# Patient Record
Sex: Female | Born: 1935 | Race: White | Hispanic: No | Smoking: Never smoker
Health system: Southern US, Community
[De-identification: ages and names within clinical notes are randomized; demographics above are authoritative.]

## PROBLEM LIST (undated history)

## (undated) ENCOUNTER — Emergency Department (HOSPITAL_COMMUNITY): Disposition: A | Discharge status: Home / Self Care | Payer: Self-pay

## (undated) DIAGNOSIS — E782 Mixed hyperlipidemia: Secondary | ICD-10-CM

## (undated) DIAGNOSIS — E119 Type 2 diabetes mellitus without complications: Secondary | ICD-10-CM

## (undated) DIAGNOSIS — I10 Essential (primary) hypertension: Secondary | ICD-10-CM

## (undated) DIAGNOSIS — G6181 Chronic inflammatory demyelinating polyneuritis: Secondary | ICD-10-CM

## (undated) DIAGNOSIS — Z86718 Personal history of other venous thrombosis and embolism: Secondary | ICD-10-CM

## (undated) HISTORY — DX: Type 2 diabetes mellitus without complications: E11.9

## (undated) HISTORY — DX: Personal history of other venous thrombosis and embolism: Z86.718

## (undated) HISTORY — DX: Chronic inflammatory demyelinating polyneuritis: G61.81

## (undated) HISTORY — DX: Mixed hyperlipidemia: E78.2

## (undated) HISTORY — DX: Essential (primary) hypertension: I10

## (undated) HISTORY — PX: CHOLECYSTECTOMY: SHX55

---

## 2005-09-07 ENCOUNTER — Other Ambulatory Visit: Admission: RE | Admit: 2005-09-07 | Discharge: 2005-09-07 | Payer: Self-pay | Admitting: Obstetrics & Gynecology

## 2008-07-11 ENCOUNTER — Emergency Department (HOSPITAL_COMMUNITY): Admission: EM | Admit: 2008-07-11 | Discharge: 2008-07-11 | Payer: Self-pay | Admitting: Emergency Medicine

## 2008-07-15 ENCOUNTER — Emergency Department (HOSPITAL_COMMUNITY): Admission: EM | Admit: 2008-07-15 | Discharge: 2008-07-15 | Payer: Self-pay | Admitting: Emergency Medicine

## 2008-08-30 ENCOUNTER — Ambulatory Visit: Payer: Self-pay | Admitting: Vascular Surgery

## 2008-08-30 ENCOUNTER — Encounter (INDEPENDENT_AMBULATORY_CARE_PROVIDER_SITE_OTHER): Payer: Self-pay | Admitting: Neurology

## 2008-08-30 ENCOUNTER — Ambulatory Visit: Admission: RE | Admit: 2008-08-30 | Discharge: 2008-08-30 | Payer: Self-pay | Admitting: Neurology

## 2008-09-05 ENCOUNTER — Encounter: Payer: Self-pay | Admitting: Neurology

## 2008-09-27 ENCOUNTER — Encounter: Payer: Self-pay | Admitting: Neurology

## 2008-10-19 ENCOUNTER — Emergency Department (HOSPITAL_COMMUNITY): Admission: EM | Admit: 2008-10-19 | Discharge: 2008-10-20 | Payer: Self-pay | Admitting: Emergency Medicine

## 2008-10-28 ENCOUNTER — Encounter: Payer: Self-pay | Admitting: Neurology

## 2009-02-25 HISTORY — PX: OTHER SURGICAL HISTORY: SHX169

## 2009-03-26 ENCOUNTER — Encounter (INDEPENDENT_AMBULATORY_CARE_PROVIDER_SITE_OTHER): Payer: Self-pay | Admitting: *Deleted

## 2009-03-26 ENCOUNTER — Ambulatory Visit (HOSPITAL_COMMUNITY): Admission: RE | Admit: 2009-03-26 | Discharge: 2009-03-26 | Payer: Self-pay | Admitting: *Deleted

## 2009-04-02 ENCOUNTER — Encounter: Admission: RE | Admit: 2009-04-02 | Discharge: 2009-04-02 | Payer: Self-pay | Admitting: General Surgery

## 2011-04-09 LAB — BASIC METABOLIC PANEL
CO2: 29 mEq/L (ref 19–32)
Calcium: 9.9 mg/dL (ref 8.4–10.5)
GFR calc Af Amer: >60 mL/min (ref >60)
Glucose, Bld: 112 mg/dL — ABNORMAL HIGH (ref 70–99)

## 2011-05-12 NOTE — Consult Note (Signed)
Linda Hess, Linda Hess              ACCOUNT NO.:  [id number]   MEDICAL RECORD NO.:  [id number]          PATIENT TYPE:  EMS   LOCATION:  MAJO                         FACILITY:  [facility]   PHYSICIAN:  Melvyn Novas, M.D.  DATE OF BIRTH:  27-Jan-1931   DATE OF CONSULTATION:  07/15/2008  DATE OF DISCHARGE:  07/15/2008                                 CONSULTATION   This patient was seen this morning by Dr. Carleene Cooper, in the [facility] ER where she presented at 7 a.m this sunday morning .  She is seen here today on July 15, 2008.  She states that on July 11, 2008, she presented at 6 p.m. to the [facility] ER visit with similar  problems that she presents this morning and as I understand, she is  frustrated that her outpatient workup has not been taken place and that  she has not been referred to a neurologist yet.  In short, she states she has left leg pain.  She had hip pain developed  on June 27, 2008, she got a cortisone shot,  I understand by her primary  care PA, who sees her instead of a primary care physician in [place].  Her hip pain subsided; however, she developed weakness and calf pain.  She was asked to see someone at [facility], there she was presenting -  again to the ER- and a head CT to rule out stroke was ordered, returned  negative.  She states, first her left lower leg hurt and then both legs started to  hurt.  A special problem was for her that she could not get into her car  without helping with her hands and arms to lift her legs into.    This is again worse on the left.  She had chronic pains in the hip and  knees, but those have not been bothering her as much.  She has been walking, but at one time; probably, Thursday last week, she  said both legs gave out and she is almost fell.  She has had, however,  multiple episodes since then that she feels that makes her unsafe for  her to ambulate out of reach of a object to hold onto.  She states that  she has  a history of diabetes mellitus; hypertension; and  hyperlipidemia, but she also had been diagnosed with coronary artery  disease and is on anti arthritis drugs.   HISTORY OF PRESENT ILLNESS:  Again, the patient had been seen in [place] by [facility], once she went to [facility], had an exam and the CT of the head, was advised to followup  with a neurologist, but states she never heard that an appointment have  been made.  Contacted [facility] where she was told  that referral forms to be processed will take 8-10 days.  Her family has  been in contact with [facility] only to learn that  we need indeed a referral and cannot see the patient on the self-  referral basis.  The patient states that yesterday, she was on a riding  lawn mower and has more achiness in her lower calves, now she points  basically to the left calf and she has weakness in the hip flexion on  the left, but she does not have significant pain and her symptoms are  described as mild-to-moderate.   PAST MEDICAL HISTORY:  As above.   SURGICAL HISTORY:  Cholecystectomy.   SOCIAL HISTORY:  The patient is retired, nonsmoker, and nondrinker.  No  history of drug abuse, living independently alone, has an adult son.  Marital status is not noted.   ALLERGIES:  She has no known drug allergies   MEDICATIONS:  1. She takes metformin orally 2 tablets of 500 mg once a day at night.  2. Simvastatin 40 mg once a day.  3. Aspirin.  She is supposed to take a baby aspirin daily and      diclofenac sodium orally 75 mg twice a day.  Lisinopril and HCTZ 10/ 12.5 mg in the morning   REVIEW OF SYSTEMS:  She only endorsed the sudden loss of lower extremity  strength that almost led to a fall, bilaterally foot numbness has been  persistent.  She has pain in the calf points to the left.  She has also  cold feet, sometimes bluish.  She has loss of reflexes and I  cannot see  any edema or clubbing.  She does not appear to be agitated or tearful.  No respiratory,  cardiovascular, gastrointestinal, or genitourinary complaints.  She has  been placed on diclofenac sodium for arthritic pain recently.   PHYSICAL EXAMINATION:  The patient is normocephalic and traumatic,  pleasant and cooperative.  I sense that she is here out of desperation,  but she has not been able to see an outpatient neurologist; however, she  is truly not in emergency.  She is in urgency at best.  Her general  physical examination shows lungs clear to auscultation.  She has  bilateral good popliteal pulses.  I had trouble finding the dorsalis  pedis pulse on the left, however, there is no evidence of clubbing or  cyanosis.  Her left foot is a slightly a bit colder than the right.    Psychiatrically, she appears intact alert and oriented x3.  She has a  supple neck.  She has no carotid bruit and no temporal artery  induration.  No TMJ pain.  No paraspinal tenderness.  No rash.  No  bruising.  Neurologic examination.  Again, mental status is intact.  No  evidence of apraxia, aphasia, or dysarthria.  Cranial nerve examination shows bilaterally equal reactivity to light  with both pupils.  There is a cataract history.  The patient has normal  appearing retinas and symmetric facial strength.  Tongue and uvula in  midline.  Full extraocular movements.  Range of motion for the neck is  intact and she has no sensory loss or facial asymmetry.  Can raise both  eyebrows and close eyes tightly.  Motor examination shows bilaterally upper extremity strengths to be  equal.  Tone and mass of the muscles is equal.  Lower extremity strength  is slightly weaker for dorsal flexion on the left foot, but plantar  flexion is intact and she had trouble lifting her leg at the hip.  Interestingly, she has a weakness for the hip flexion is on the right.  On physical exam, I needed to support the patient.   She could only  maintain  antigravity movement for about 4 seconds before she developed a  drift.  Deep tendon reflexes are attenuated throughout.  I cannot detect  a Babinski reflex.  There is no active tendon reflex, no patellar  reflex.  Upper extremity traces of reflexes.  Sensory to primary modalities absent in both feet up to the midcalf  level.  No hand numbness however.  No facial carry or mandibular  numbness, or tingling or dysesthesia.    Gait and station:  The patient was able to ambulate for me in the room  and took at least a dozen steps without any evidence of drifting or  viewing to the side.  She cannot perform a toe stand or heel walk.  No  tandem gait tested.  Coordination was tested by finger-to-nose.  Heel-to-shin was normal with  the left leg on the right shin.  When returning to the bed, the patient has trouble flexing the leg at  the hip again.   ASSESSMENT:  I do not think that a strictly arthritic origin for this  problem exisists. No traum at origin/ onset time and changing symptms  after the steroid injection are suspecious.   .  I do wonder if the patient has;  1. Diabetic neuropathy, given her areflexia, sensory loss in both      lower extremities, and her history of diabetes that was diagnosed 9      years ago, but worsened over the last 8 months leading to an      increase in oral medication.Diabetic amyatrophyin the differential      .  2. The patient's calf pain according to ER notes was not thought to      berelated to a deep venous thrombosis since she had a normal      Doppler , but she reported here symptoms of calf pain that do not      ascend above the knee-  the doppler studies were supposingly done ,      but I have not found an original report, reserve judgement .  3. 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  4.The  patient has not had lower back pain after straight leg raising  test, this is not a radiculopathic finding and she has no other cord  symptoms such as bladder or bowel incontinence.   PLAN:  At this time to admit to see the patient in the outpatient  environment soon for an EMG with nerve conduction study.  1. I would actually like to test both lower extremities (call to      schedule follow up at [phone]).  2. She needs labs, a CK/-MB, a C-reactive protein level, sed rate,      vitamin D level, and a basic metabolic panel.  I explained to the      patient that we will be happy to see her in a followup visit,      hopefully within the next 2-3 weeks and that she should call our      office on Monday to make the followup appointment, which could be      scheduled on a working day.      Melvyn Novas, M.D.  Electronically Signed     CD/MEDQ  D:  07/15/2008  T:  07/15/2008  Job:  [id number]   cc:   Nicoletta Dress. Colon Branch, M.D.  Osvaldo Human, M.D.  [facility]  Rhae Lerner. Margretta Ditty, M.D.

## 2011-05-12 NOTE — Op Note (Signed)
Linda Hess, Linda Hess              ACCOUNT NO.:  [id number]   MEDICAL RECORD NO.:  [id number]          PATIENT TYPE:  AMB   LOCATION:  DAY                          FACILITY:  [facility]   PHYSICIAN:  Alfonse Ras, MD   DATE OF BIRTH:  07/24/1931   DATE OF PROCEDURE:  DATE OF DISCHARGE:                               OPERATIVE REPORT   PREOPERATIVE DIAGNOSIS:  Diffuse muscle weakness.   POSTOPERATIVE DIAGNOSIS:  Diffuse muscle weakness.   PROCEDURE:  Left quadriceps muscle biopsy.   SURGEON:  Alfonse Ras, MD   ANESTHESIA:  MAC.   DESCRIPTION:  The patient was taken to the operating room.  After  informed consent was granted, she was placed in supine position.  MAC  anesthesia was induced and the left thigh was prepped and draped in  normal sterile fashion.  Using 1% lidocaine local anesthesia, I  anesthetized the skin and subcutaneous tissue.  Making a vertical  incision, I dissected down to subcutaneous fat onto the quadriceps  fascia.  This was opened vertically.  An area of muscle was then  mobilized with hemostats, and transected and sent for pathologic  evaluation.  Adequate hemostasis was assured and the skin was closed  with subcuticular 3-0 Monocryl.  Steri-Strips and sterile dressings were  applied.  The patient tolerated the procedure well and went to PACU in  good condition.      Alfonse Ras, MD  Electronically Signed     KRE/MEDQ  D:  03/26/2009  T:  03/26/2009  Job:  [id number]

## 2011-09-29 LAB — URINE MICROSCOPIC-ADD ON

## 2011-09-29 LAB — DIFFERENTIAL
Basophils Relative: 1
Eosinophils Absolute: 0.1
Eosinophils Relative: 1
Lymphocytes Relative: 19
Lymphs Abs: 1.9
Neutro Abs: 7.1

## 2011-09-29 LAB — CBC
Hemoglobin: 10.4 — ABNORMAL LOW
MCHC: 33.4
MCV: 85.7
Platelets: 256
RBC: 3.62 — ABNORMAL LOW
RDW: 14.7
WBC: 10

## 2011-09-29 LAB — URINALYSIS, ROUTINE W REFLEX MICROSCOPIC
Glucose, UA: NEGATIVE
Urobilinogen, UA: 1

## 2011-09-29 LAB — BASIC METABOLIC PANEL
Creatinine, Ser: 0.94
Glucose, Bld: 145 — ABNORMAL HIGH
Potassium: 4
Sodium: 133 — ABNORMAL LOW

## 2011-09-29 LAB — GLUCOSE, CAPILLARY: Glucose-Capillary: 145 — ABNORMAL HIGH

## 2012-04-26 ENCOUNTER — Other Ambulatory Visit (HOSPITAL_COMMUNITY): Payer: Self-pay | Admitting: Family Medicine

## 2012-04-26 DIAGNOSIS — Z139 Encounter for screening, unspecified: Secondary | ICD-10-CM

## 2012-04-29 ENCOUNTER — Ambulatory Visit (HOSPITAL_COMMUNITY)
Admission: RE | Admit: 2012-04-29 | Discharge: 2012-04-29 | Disposition: A | Discharge status: Home / Self Care | Payer: Medicare Other | Source: Ambulatory Visit | Attending: Family Medicine | Admitting: Family Medicine

## 2012-04-29 DIAGNOSIS — M899 Disorder of bone, unspecified: Secondary | ICD-10-CM | POA: Insufficient documentation

## 2012-04-29 DIAGNOSIS — Z139 Encounter for screening, unspecified: Secondary | ICD-10-CM

## 2013-07-24 ENCOUNTER — Ambulatory Visit: Payer: Self-pay | Admitting: Orthopedic Surgery

## 2013-08-22 ENCOUNTER — Ambulatory Visit: Payer: Self-pay | Admitting: Orthopedic Surgery

## 2013-08-22 DIAGNOSIS — E119 Type 2 diabetes mellitus without complications: Secondary | ICD-10-CM

## 2013-08-22 LAB — URINALYSIS, COMPLETE
Bilirubin,UR: NEGATIVE
Blood: NEGATIVE
Glucose,UR: >=500 mg/dL (ref 0–75)
Leukocyte Esterase: NEGATIVE
Ph: 6 (ref 4.5–8.0)
Specific Gravity: 1.018 (ref 1.003–1.030)
WBC UR: 1 /HPF (ref 0–5)

## 2013-08-22 LAB — BASIC METABOLIC PANEL
BUN: 42 mg/dL — ABNORMAL HIGH (ref 7–18)
Calcium, Total: 9.5 mg/dL (ref 8.5–10.1)
Chloride: 106 mmol/L (ref 98–107)
Co2: 30 mmol/L (ref 21–32)
Creatinine: 1.23 mg/dL (ref 0.60–1.30)
EGFR (African American): 48 — ABNORMAL LOW
EGFR (Non-African Amer.): 42 — ABNORMAL LOW
Glucose: 237 mg/dL — ABNORMAL HIGH (ref 65–99)
Osmolality: 298 (ref 275–301)
Sodium: 140 mmol/L (ref 136–145)

## 2013-08-22 LAB — SEDIMENTATION RATE: Erythrocyte Sed Rate: 4 mm/hr (ref 0–30)

## 2013-08-22 LAB — CBC
HGB: 14.3 g/dL (ref 12.0–16.0)
MCH: 31.4 pg (ref 26.0–34.0)
RDW: 14.4 % (ref 11.5–14.5)
WBC: 9.9 10*3/uL (ref 3.6–11.0)

## 2013-08-22 LAB — PROTIME-INR: INR: 0.8

## 2013-08-28 HISTORY — PX: OTHER SURGICAL HISTORY: SHX169

## 2013-08-29 ENCOUNTER — Inpatient Hospital Stay: Payer: Self-pay | Admitting: Orthopedic Surgery

## 2013-08-30 LAB — PLATELET COUNT: Platelet: 124 10*3/uL — ABNORMAL LOW (ref 150–440)

## 2013-08-30 LAB — BASIC METABOLIC PANEL
BUN: 27 mg/dL — ABNORMAL HIGH (ref 7–18)
Calcium, Total: 9.1 mg/dL (ref 8.5–10.1)
Chloride: 107 mmol/L (ref 98–107)
Co2: 26 mmol/L (ref 21–32)
EGFR (African American): >60
Glucose: 164 mg/dL — ABNORMAL HIGH (ref 65–99)
Potassium: 5.2 mmol/L — ABNORMAL HIGH (ref 3.5–5.1)
Sodium: 139 mmol/L (ref 136–145)

## 2013-08-31 LAB — PATHOLOGY REPORT

## 2013-08-31 LAB — POTASSIUM: Potassium: 4.3 mmol/L (ref 3.5–5.1)

## 2013-09-01 ENCOUNTER — Encounter: Payer: Self-pay | Admitting: Internal Medicine

## 2013-09-27 ENCOUNTER — Encounter: Payer: Self-pay | Admitting: Internal Medicine

## 2013-10-30 ENCOUNTER — Ambulatory Visit: Payer: Self-pay | Admitting: Orthopedic Surgery

## 2014-01-11 ENCOUNTER — Other Ambulatory Visit (HOSPITAL_COMMUNITY): Payer: Self-pay | Admitting: Podiatry

## 2014-01-11 DIAGNOSIS — L98499 Non-pressure chronic ulcer of skin of other sites with unspecified severity: Secondary | ICD-10-CM

## 2014-01-15 ENCOUNTER — Ambulatory Visit (HOSPITAL_COMMUNITY)
Admission: RE | Admit: 2014-01-15 | Discharge: 2014-01-15 | Disposition: A | Discharge status: Home / Self Care | Payer: Medicare Other | Source: Ambulatory Visit | Attending: Podiatry | Admitting: Podiatry

## 2014-01-15 DIAGNOSIS — L97509 Non-pressure chronic ulcer of other part of unspecified foot with unspecified severity: Secondary | ICD-10-CM | POA: Insufficient documentation

## 2014-01-15 DIAGNOSIS — L98499 Non-pressure chronic ulcer of skin of other sites with unspecified severity: Secondary | ICD-10-CM | POA: Insufficient documentation

## 2014-01-15 DIAGNOSIS — E119 Type 2 diabetes mellitus without complications: Secondary | ICD-10-CM | POA: Insufficient documentation

## 2014-01-15 DIAGNOSIS — L97209 Non-pressure chronic ulcer of unspecified calf with unspecified severity: Secondary | ICD-10-CM | POA: Insufficient documentation

## 2014-01-15 DIAGNOSIS — I739 Peripheral vascular disease, unspecified: Secondary | ICD-10-CM | POA: Insufficient documentation

## 2014-03-12 DIAGNOSIS — E1122 Type 2 diabetes mellitus with diabetic chronic kidney disease: Secondary | ICD-10-CM | POA: Insufficient documentation

## 2014-03-12 DIAGNOSIS — G6181 Chronic inflammatory demyelinating polyneuritis: Secondary | ICD-10-CM | POA: Insufficient documentation

## 2014-03-12 DIAGNOSIS — N183 Chronic kidney disease, stage 3 unspecified: Secondary | ICD-10-CM | POA: Insufficient documentation

## 2014-03-12 DIAGNOSIS — IMO0001 Reserved for inherently not codable concepts without codable children: Secondary | ICD-10-CM

## 2014-03-12 DIAGNOSIS — N189 Chronic kidney disease, unspecified: Secondary | ICD-10-CM | POA: Insufficient documentation

## 2014-03-12 DIAGNOSIS — E782 Mixed hyperlipidemia: Secondary | ICD-10-CM | POA: Insufficient documentation

## 2014-03-12 DIAGNOSIS — IMO0002 Reserved for concepts with insufficient information to code with codable children: Secondary | ICD-10-CM | POA: Insufficient documentation

## 2014-03-12 DIAGNOSIS — M1990 Unspecified osteoarthritis, unspecified site: Secondary | ICD-10-CM | POA: Insufficient documentation

## 2014-03-12 DIAGNOSIS — E1165 Type 2 diabetes mellitus with hyperglycemia: Secondary | ICD-10-CM

## 2014-03-12 DIAGNOSIS — I10 Essential (primary) hypertension: Secondary | ICD-10-CM

## 2014-03-12 DIAGNOSIS — E785 Hyperlipidemia, unspecified: Secondary | ICD-10-CM

## 2014-03-14 ENCOUNTER — Ambulatory Visit (INDEPENDENT_AMBULATORY_CARE_PROVIDER_SITE_OTHER): Payer: Medicare Other | Admitting: Cardiology

## 2014-03-14 ENCOUNTER — Encounter: Payer: Self-pay | Admitting: Cardiology

## 2014-03-14 ENCOUNTER — Other Ambulatory Visit: Payer: Self-pay | Admitting: Cardiology

## 2014-03-14 VITALS — BP 130/80 | HR 68 | Ht 66.0 in | Wt 176.4 lb

## 2014-03-14 DIAGNOSIS — E782 Mixed hyperlipidemia: Secondary | ICD-10-CM

## 2014-03-14 DIAGNOSIS — G6181 Chronic inflammatory demyelinating polyneuritis: Secondary | ICD-10-CM | POA: Insufficient documentation

## 2014-03-14 DIAGNOSIS — I739 Peripheral vascular disease, unspecified: Secondary | ICD-10-CM | POA: Insufficient documentation

## 2014-03-14 DIAGNOSIS — E119 Type 2 diabetes mellitus without complications: Secondary | ICD-10-CM

## 2014-03-14 DIAGNOSIS — I10 Essential (primary) hypertension: Secondary | ICD-10-CM

## 2014-03-14 NOTE — Assessment & Plan Note (Signed)
On Zocor, followed by primary care.

## 2014-03-14 NOTE — Assessment & Plan Note (Signed)
Not on any specific antihypertensives at this time. She does have LVH by ECG with probable repolarization abnormalities. Cardiac structure and function being evaluated by echocardiogram.

## 2014-03-14 NOTE — Assessment & Plan Note (Signed)
On glipizide, followed by primary care.

## 2014-03-14 NOTE — Assessment & Plan Note (Signed)
Reportedly diagnosed approximately 5 years ago, on chronic steroids. Patient tells that she is in the process of trying to reestablish regular neurology followup through [facility].

## 2014-03-14 NOTE — Assessment & Plan Note (Signed)
Patient without any clear history of obstructive peripheral arterial disease, no claudication, recent arterial studies in January showing ABI of 0.99 on the right and 0.96 on the left. Possibility of mild occlusive disease, predominantly left infrapopliteal is mentioned. This seems to be an unlikely explanation for her venous stasis and chronic leg edema. She does mention a prior history of left leg "blood clot" presumably a DVT, although information is somewhat vague. She was previously on Coumadin. We will plan to go ahead and get bilateral leg venous Dopplers, she certainly might have venous insufficiency. Echocardiogram is being obtained to assess cardiac structure and function, mainly to exclude cardiomyopathy as a potential contributor. We will inform her of the results. If additional vascular evaluation is needed, I will refer her to one of our vascular interventionalists in the group.

## 2014-03-14 NOTE — Progress Notes (Signed)
Clinical Summary Ms. Lorenda HatchetSlade is an 80 y.o.female referred for cardiology consultation by Mr. Cleotis Lemaobertson PA-C [facility]. Records indicate concern for possible peripheral arterial disease. 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 She has had neurology followup with different providers over time, tells that she is trying to reestablish with a neurologist at [facility] that she had seen in the past. She has been on long-term steroids, currently on prednisone 15 mg daily. She uses a walker to ambulate, denies any leg pain or definite claudication, reports discoloration of her legs below the knees for several years. She does report having had an ulcer on her right foot that took a long time to heal, but states that this has since done well.  She also provides a history of a "blood clot" in her left leg several years ago. Details are not clear. She does not recall actually having any testing on her leg at that time. She does state that she was on Coumadin for nearly 2 years. I presume that she had a DVT on the left.  Recent lower extremity Dopplers done in January of this year demonstrated only mild lower extremity arterial occlusive disease at rest, predominantly left infrapopliteal. ABI was 0.99 on the right, and 0.96 on the left. I discussed this with her today.  ECG shows sinus rhythm with LVH and probable repolarization abnormalities, no old tracing available for comparison however. She does not endorse any exertional chest pain or limiting shortness of breath. She states that she lives alone and is functional in her ADLs including some housework.   No Known Allergies  Current Outpatient Prescriptions  Medication Sig Dispense Refill  . folic acid (FOLVITE) 1 MG tablet Take 1 mg by mouth daily.      Marland Kitchen. gabapentin (NEURONTIN) 300 MG capsule Take 300 mg by mouth 3 (three) times daily.      Marland Kitchen. glipiZIDE (GLUCOTROL) 5 MG tablet Take 5 mg by mouth daily before  breakfast.      . simvastatin (ZOCOR) 10 MG tablet Take 10 mg by mouth daily.      . Ascorbic Acid (VITA-C) CRYS Take 1 tablet by mouth daily.    0  . calcium-vitamin D (OSCAL WITH D) 500-200 MG-UNIT per tablet Take 1 tablet by mouth daily with breakfast.      . Iron TABS Take 1 tablet by mouth daily.      . predniSONE (DELTASONE) 5 MG tablet Take 3 tablets (15 mg total) by mouth daily with breakfast.       No current facility-administered medications for this visit.    Past Medical History  Diagnosis Date  . Type 2 diabetes mellitus   . Essential hypertension, benign   . Mixed hyperlipidemia   . History of DVT (deep vein thrombosis)     Reportedly left leg, details not clear  . CIDP (chronic inflammatory demyelinating polyneuropathy)     Past Surgical History  Procedure Laterality Date  . Right knee replacement  September 2014  . Left quadriceps muscle biopsy  March 2010  . Cholecystectomy      Family History  Problem Relation Age of Onset  . Heart attack Father   . Stroke Mother   . Cancer Brother     Social History Ms. Lorenda HatchetSlade reports that she has never smoked. She does not have any smokeless tobacco history on file. Ms. Lorenda HatchetSlade reports that she does not drink alcohol.  Review of Systems No fevers  or chills. No orthopnea or PND. Stable appetite. No reported bleeding episodes. Negative except as outlined.  Physical Examination Filed Vitals:   03/14/14 0917  BP: 130/80  Pulse: 68   Filed Weights   03/14/14 0917  Weight: 176 lb 6.4 oz (80.015 kg)   Patient is comfortable at rest. HEENT: Conjunctiva and lids normal, oropharynx clear. Neck: Supple, no elevated JVP or carotid bruits, no thyromegaly. Lungs: Clear to auscultation, nonlabored breathing at rest. Cardiac: Regular rate and rhythm, soft S4, no significant systolic murmur, no pericardial rub. Abdomen: Soft, nontender, bowel sounds present, no guarding or rebound. Extremities: Significant venous stasis below  the knees bilaterally, evidence of prior skin injury on the right, some excoriations. 1+ edema is noted with some skin pitting, distal pulses 1+. Skin: Warm and dry. Musculoskeletal: No kyphosis. Neuropsychiatric: Alert and oriented x3, affect grossly appropriate.   Problem List and Plan   Peripheral arterial disease Patient without any clear history of obstructive peripheral arterial disease, no claudication, recent arterial studies in January showing ABI of 0.99 on the right and 0.96 on the left. Possibility of mild occlusive disease, predominantly left infrapopliteal is mentioned. This seems to be an unlikely explanation for her venous stasis and chronic leg edema. She does mention a prior history of left leg "blood clot" presumably a DVT, although information is somewhat vague. She was previously on Coumadin. We will plan to go ahead and get bilateral leg venous Dopplers, she certainly might have venous insufficiency. Echocardiogram is being obtained to assess cardiac structure and function, mainly to exclude cardiomyopathy as a potential contributor. We will inform her of the results. If additional vascular evaluation is needed, I will refer her to one of our vascular interventionalists in the group.  CIDP (chronic inflammatory demyelinating polyneuropathy) Reportedly diagnosed approximately 5 years ago, on chronic steroids. Patient tells that she is in the process of trying to reestablish regular neurology followup through [facility]  Essential hypertension, benign Not on any specific antihypertensives at this time. She does have LVH by ECG with probable repolarization abnormalities. Cardiac structure and function being evaluated by echocardiogram.  Mixed hyperlipidemia On Zocor, followed by primary care.  Type 2 diabetes mellitus On glipizide, followed by primary care.    Jonelle Sidle, M.D., F.A.C.C.

## 2014-03-14 NOTE — Patient Instructions (Signed)
Your physician recommends that you schedule a follow-up appointment in: AS NEEDED  Your physician has requested that you have an echocardiogram. Echocardiography is a painless test that uses sound waves to create images of your heart. It provides your doctor with information about the size and shape of your heart and how well your heart's chambers and valves are working. This procedure takes approximately one hour. There are no restrictions for this procedure.   Your physician has requested that you have a lower or upper extremity venous duplex. This test is an ultrasound of the veins in the legs or arms. It looks at venous blood flow that carries blood from the heart to the legs or arms. Allow one hour for a Lower Venous exam. Allow thirty minutes for an Upper Venous exam. There are no restrictions or special instructions.

## 2014-03-19 ENCOUNTER — Ambulatory Visit (HOSPITAL_COMMUNITY)
Admission: RE | Admit: 2014-03-19 | Discharge: 2014-03-19 | Disposition: A | Discharge status: Home / Self Care | Payer: Medicare Other | Source: Ambulatory Visit | Attending: Cardiology | Admitting: Cardiology

## 2014-03-19 DIAGNOSIS — I739 Peripheral vascular disease, unspecified: Secondary | ICD-10-CM

## 2014-03-19 DIAGNOSIS — I10 Essential (primary) hypertension: Secondary | ICD-10-CM

## 2014-03-19 DIAGNOSIS — R609 Edema, unspecified: Secondary | ICD-10-CM | POA: Insufficient documentation

## 2014-03-19 DIAGNOSIS — E785 Hyperlipidemia, unspecified: Secondary | ICD-10-CM | POA: Insufficient documentation

## 2014-03-19 DIAGNOSIS — E119 Type 2 diabetes mellitus without complications: Secondary | ICD-10-CM | POA: Insufficient documentation

## 2014-03-19 DIAGNOSIS — I517 Cardiomegaly: Secondary | ICD-10-CM

## 2014-03-19 DIAGNOSIS — M79609 Pain in unspecified limb: Secondary | ICD-10-CM | POA: Insufficient documentation

## 2014-03-19 DIAGNOSIS — I82409 Acute embolism and thrombosis of unspecified deep veins of unspecified lower extremity: Secondary | ICD-10-CM | POA: Insufficient documentation

## 2014-03-19 DIAGNOSIS — R9431 Abnormal electrocardiogram [ECG] [EKG]: Secondary | ICD-10-CM | POA: Insufficient documentation

## 2014-03-19 NOTE — Progress Notes (Signed)
*  PRELIMINARY RESULTS* Echocardiogram 2D Echocardiogram has been performed.  Linda Hess 03/19/2014, 11:20 AM

## 2015-04-19 NOTE — Discharge Summary (Signed)
PATIENT NAME:  Linda CelestineSLADE, Blenda P MR#:  [id number] DATE OF BIRTH:  07-28-1931  DATE OF ADMISSION:  08/29/2013 DATE OF DISCHARGE:  09/01/2013  ADMITTING DIAGNOSIS: Right knee osteoarthritis.   DISCHARGE DIAGNOSIS: Right knee osteoarthritis.   PROCEDURE: Right total knee replacement.   SURGEON: Leitha SchullerMichael J. Menz, M.D.   ASSISTANT: Cranston Neighborhris Lorilyn Laitinen, PA-C.   ANESTHESIA: General.   ESTIMATED BLOOD LOSS: 50 mL.  TOURNIQUET TIME: 64 minutes at 300 mmHg.  IMPLANTS: Medacta GMK sphere system with a size 2 patella, a right size 12 mm Flex force, size 4 tibial insert with a right size 4 femoral component,  sphere type, a tibial tray, which was T3-I4 with an 11 mm x 65 mm extension stem.   SPECIMEN: Cut ends of bone.   HISTORY: The patient is 81 year old with severe right knee osteoarthritis. She had debilitating pain and marked stiffness. She is now in for a history and physical. She has had nonoperative treatment without relief. She currently is taking prednisone 20 mg a day, gabapentin and tramadol. She has a history of chronic inflammatory thigh neuropathy.   PHYSICAL EXAMINATION:  LUNGS: Clear to auscultation.  HEART: Regular rate and rhythm.  EXTREMITIES: She has 15 to 90 degree range of motion with the right knee with valgus deformity that is passively correctable. She has a drop foot and diminished sensation of the plantar and dorsal aspect of the foot. She has mild swelling to the knee with a Baker's cyst present. There is crepitation on range of motion. She has trace dorsalis pedis pulse. She has full upper and lower dentures.   HOSPITAL COURSE: The patient was admitted to the hospital on 08/29/2013. She had surgery that same day and was brought to the orthopedic floor from the PACU in stable condition. On postoperative day 1, the patient's hemoglobin was found to be normal. Her labs and vital signs are stable. On postoperative day 2, potassium was stable and 4.5. She progressed very well with  physical therapy. On postoperative day 3, the patient was doing very well. She did have small skin tears on the posterior aspect of the knee. These were kept clean and dry and dressings changed as needed. There are no signs of infection. The patient is doing well and ready for discharge to a rehab facility.   CONDITION AT DISCHARGE: Stable.   DISCHARGE INSTRUCTIONS:  1.  The patient may gradually increase weight-bearing on the affected extremity.  2.  She is to wear thigh-high TED hose on both legs and remove 1 hour per 8 hour shift. Elevate heels off the bed.  3.  Use incentive spirometry every hour while awake and encouraged cough and deep breathing. 4.  She may resume a regular diet as tolerated.  5.  Do not get the dressing or bandage wet or dirty.  6 . Call [facility] if any of the following occur: Any bright red bleeding from the incision wound, fever above 101.5 degrees, redness, swelling or drainage at the incision.  7.  Call [facility] if you experience any increased leg pain, numbness or weakness in your legs or bowel or bladder symptoms.  8.  She is referred to physical therapy.  9.  [facility] is to [facility] for followup appointment in 2 weeks.   DISCHARGE MEDICATIONS: Gabapentin 300 mg 1 cap 3 times a day, simvastatin 10 mg 1 tablet orally once a day, glipizide extended-release 5 mg tablet 1 tablet orally once a day in the morning, prednisone 10 mg  2 tabs orally once a day, ferrous sulfate 65 mg orally once a day in the morning, vitamin C 500 mg tablet 1 tablet orally once a day in the morning, folic acid 0.8 mg tablet 1 tablet orally once a day in the morning, fish oil 1000 mg one orally in the morning, aspirin 81 mg 1 tablet orally once a day at bedtime, calcium and vitamin D one cap orally 2 times a day, oxycodone 5 mg 1 tablet orally every 4 hours as needed for pain, Tylenol 500 mg oral tablet 1 tablet orally every 4 hours as needed for pain or temperature greater than 100.4, magnesium  hydroxide 8% oral suspension 30 mL orally 2 times a day as needed for constipation, milk of magnesia 30 mL orally every 6 hours as needed for indigestion and heartburn, insulin regular 100 units, Zofran 4 mg oral tablet 1 tablet orally every 8t hours needed, zolpidem 5 mg 1 tablet orally once day at bedtime as needed, nystatin 1000 units topical powder 1 application topically 2 times a day, bisacodyl 10 mg rectal suppository 1 suppository rectally once a day as needed for constipation and docusate/senna 50 mg/8.6 mg oral tablet 1 tablet orally 2 times a day. ____________________________ T. Cranston Neighbor, PA-C tcg:aw D: 09/01/2013 07:52:28 ET T: 09/01/2013 08:10:11 ET JOB#: [id number]  cc: T. Cranston Neighbor, PA-C, <Dictator> Evon Slack [place] ELECTRONICALLY SIGNED 10/04/2013 9:09

## 2015-04-19 NOTE — Op Note (Signed)
PATIENT NAME:  Linda CelestineSLADE, Karolyne P MR#:  [id number] DATE OF BIRTH:  1931/09/10  DATE OF PROCEDURE:  08/29/2013  PREOPERATIVE DIAGNOSIS: Right knee osteoarthritis.   POSTOPERATIVE DIAGNOSIS: Right knee osteoarthritis.   PROCEDURE: Right total knee replacement.   SURGEON: Leitha SchullerMichael J. Etrulia Zarr, M.D.   ASSISTANT:  Cranston Neighborhris Gaines, PA-C.   ANESTHESIA: General.   DESCRIPTION OF PROCEDURE: The patient was brought to the operating room and after adequate anesthesia was obtained, the right leg was prepped and draped in the usual sterile fashion with a tourniquet applied to the upper thigh. After prepping and draping in the usual sterile fashion with the Alvarado legholder utilized, appropriate patient identification and timeout procedures were completed. The leg was exsanguinated with an Esmarch and the tourniquet raised. Next, the midline skin incision was made followed by medial parapatellar arthrotomy. Findings included significant synovitis within the joint, severe lateral compartment arthrosis, moderate medial compartment arthritis and severe patellofemoral disease. Fat pad and ACL were excised, and the proximal tibia and distal femur were exposed for placement of the Medacta cutting blocks.  Tibia cut was made first and then the residual meniscus posterior horns were removed, along with the PCL. A distal femoral guide was placed and distal femoral cut carried out. These cuts matched the template. A size 4 cutting block was applied, anterior and posterior chamfer cuts made, and the trial fit well. With tibia trial being placed, rotation based on the one of the initial tibial pins. Additionally, because of has severe osteoporosis, a sleeve was attached with proximal hand reaming carried out and a 70 mm trial stem also placed on our tibial tray. After the trials were placed, a 12 mm insert gave very good stability throughout a range of motion and allowed for full extension. The patella was cut with a tibia guide and  measured a size 2 with 3 drill holes made. The notch cut was then made for the distal femur and all trial components were removed. The tourniquet was let down to make sure there was no excessive bleeding, and there was no significant bleeding. The knee was injected at this point with Exparel mixed with saline in a periarticular fashion for postop analgesia, as was a combination of morphine with Sensorcaine. Next, the tourniquet was raised and the bony surfaces again thoroughly irrigated and dried. The tibial component was cemented into place first with excess cement removed, followed by placement of the 12 mm tibial insert. The femoral component was impacted into place and the knee held in extension while the patellar button was also clamped into place. After the cement had set, excess cement was removed. The patella tracked well with no-touch technique. The arthrotomy was then repaired using a heavy quill with additional Ethibond because of her poor tissue quality secondary to steroids. The subcutaneous tissue was approximated using 2-0 quill suture, followed by skin staples. Xeroform, 4 x 4's, Webril and bias wrap were applied, along with a Polar Care and knee immobilizer. The patient was sent to the recovery room in stable condition.   ESTIMATED BLOOD LOSS: 50 mL.   TOURNIQUET TIME: 64 minutes at 300 mmHg.   IMPLANTS: Medacta GMK Sphere System with a size 2 patella, a size right 12 mm flex 4, size 4 tibial insert with a right size 4 femoral component, sphere type, a tibial tray, which was T3-I4 with an 11 mm x 65 mm extension stem.   SPECIMENS: Cut ends of bone.     ____________________________ Leitha SchullerMichael J. Onesty Clair, MD mjm:dmm  D: 08/29/2013 20:45:52 ET T: 08/29/2013 22:12:29 ET JOB#: [id number]  cc: Leitha Schuller, MD, <Dictator> Leitha Schuller MD ELECTRONICALLY SIGNED 08/30/2013 7:11

## 2015-06-19 ENCOUNTER — Emergency Department (HOSPITAL_COMMUNITY): Payer: Medicare Other

## 2015-06-19 ENCOUNTER — Emergency Department (HOSPITAL_COMMUNITY)
Admission: EM | Admit: 2015-06-19 | Discharge: 2015-06-19 | Disposition: A | Discharge status: Home / Self Care | Payer: Medicare Other | Attending: Emergency Medicine | Admitting: Emergency Medicine

## 2015-06-19 ENCOUNTER — Encounter (HOSPITAL_COMMUNITY): Payer: Self-pay | Admitting: Emergency Medicine

## 2015-06-19 DIAGNOSIS — Z86718 Personal history of other venous thrombosis and embolism: Secondary | ICD-10-CM | POA: Insufficient documentation

## 2015-06-19 DIAGNOSIS — M7989 Other specified soft tissue disorders: Secondary | ICD-10-CM

## 2015-06-19 DIAGNOSIS — G6181 Chronic inflammatory demyelinating polyneuritis: Secondary | ICD-10-CM | POA: Insufficient documentation

## 2015-06-19 DIAGNOSIS — E782 Mixed hyperlipidemia: Secondary | ICD-10-CM | POA: Insufficient documentation

## 2015-06-19 DIAGNOSIS — R2243 Localized swelling, mass and lump, lower limb, bilateral: Secondary | ICD-10-CM | POA: Diagnosis present

## 2015-06-19 DIAGNOSIS — E119 Type 2 diabetes mellitus without complications: Secondary | ICD-10-CM | POA: Diagnosis not present

## 2015-06-19 DIAGNOSIS — Z7952 Long term (current) use of systemic steroids: Secondary | ICD-10-CM | POA: Insufficient documentation

## 2015-06-19 DIAGNOSIS — Z96651 Presence of right artificial knee joint: Secondary | ICD-10-CM | POA: Insufficient documentation

## 2015-06-19 DIAGNOSIS — R600 Localized edema: Secondary | ICD-10-CM | POA: Diagnosis not present

## 2015-06-19 DIAGNOSIS — I10 Essential (primary) hypertension: Secondary | ICD-10-CM | POA: Insufficient documentation

## 2015-06-19 DIAGNOSIS — Z79899 Other long term (current) drug therapy: Secondary | ICD-10-CM | POA: Insufficient documentation

## 2015-06-19 LAB — CBC WITH DIFFERENTIAL/PLATELET
Basophils Absolute: 0 10*3/uL (ref 0.0–0.1)
Basophils Relative: 0 % (ref 0–1)
Eosinophils Absolute: 0 10*3/uL (ref 0.0–0.7)
Eosinophils Relative: 0 % (ref 0–5)
HCT: 44.9 % (ref 36.0–46.0)
Hemoglobin: 14.7 g/dL (ref 12.0–15.0)
LYMPHS ABS: 0.8 10*3/uL (ref 0.7–4.0)
LYMPHS PCT: 8 % — AB (ref 12–46)
MCH: 31.5 pg (ref 26.0–34.0)
MCHC: 32.7 g/dL (ref 30.0–36.0)
MCV: 96.1 fL (ref 78.0–100.0)
MONOS PCT: 2 % — AB (ref 3–12)
Monocytes Absolute: 0.2 10*3/uL (ref 0.1–1.0)
NEUTROS PCT: 90 % — AB (ref 43–77)
Neutro Abs: 8.7 10*3/uL — ABNORMAL HIGH (ref 1.7–7.7)
PLATELETS: 145 10*3/uL — AB (ref 150–400)
RBC: 4.67 MIL/uL (ref 3.87–5.11)
RDW: 12.5 % (ref 11.5–15.5)
WBC: 9.7 10*3/uL (ref 4.0–10.5)

## 2015-06-19 LAB — BRAIN NATRIURETIC PEPTIDE: B NATRIURETIC PEPTIDE 5: 223 pg/mL — AB (ref 0.0–100.0)

## 2015-06-19 LAB — BASIC METABOLIC PANEL
Anion gap: 12 (ref 5–15)
BUN: 37 mg/dL — ABNORMAL HIGH (ref 6–20)
CHLORIDE: 104 mmol/L (ref 101–111)
CO2: 23 mmol/L (ref 22–32)
Calcium: 9.4 mg/dL (ref 8.9–10.3)
Creatinine, Ser: 1.52 mg/dL — ABNORMAL HIGH (ref 0.44–1.00)
GFR calc Af Amer: 36 mL/min — ABNORMAL LOW (ref >60)
GFR calc non Af Amer: 31 mL/min — ABNORMAL LOW (ref >60)
GLUCOSE: 297 mg/dL — AB (ref 65–99)
POTASSIUM: 4.4 mmol/L (ref 3.5–5.1)
SODIUM: 139 mmol/L (ref 135–145)

## 2015-06-19 NOTE — ED Provider Notes (Signed)
CSN: [id number]     Arrival date & time 06/19/15  1255 History   First MD Initiated Contact with Patient 06/19/15 1310     Chief Complaint  Patient presents with  . Leg Swelling     (Consider location/radiation/quality/duration/timing/severity/associated sxs/prior Treatment) HPI Comments: Patient sent from PCPs office with leg swelling for the past one month. Right greater than left. Her PCP is concerned about blood clot which she's had in the past. She reports injuring her leg against a flowerpot about a month ago and has a hematoma to her medial right tibia. She denies any pain. She is on aspirin but no other anticoagulation. She has a second injury on the left tibia from 3 months ago that is improving. There is a small hematoma to the left tibia as well. 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 She has chronic foot drop bilaterally. She denies any history of heart failure. No chest pain or shortness of breath. No nausea or vomiting. No fever.[place]   The history is provided by the patient.    Past Medical History  Diagnosis Date  . Type 2 diabetes mellitus   . Essential hypertension, benign   . Mixed hyperlipidemia   . History of DVT (deep vein thrombosis)     Reportedly left leg, details not clear  . CIDP (chronic inflammatory demyelinating polyneuropathy)    Past Surgical History  Procedure Laterality Date  . Right knee replacement  September 2014  . Left quadriceps muscle biopsy  March 2010  . Cholecystectomy     Family History  Problem Relation Age of Onset  . Heart attack Father   . Stroke Mother   . Cancer Brother    History  Substance Use Topics  . Smoking status: Never Smoker   . Smokeless tobacco: Not on file  . Alcohol Use: No   OB History    No data available     Review of Systems  Constitutional: Negative for fever, activity change and appetite change.  HENT: Negative for congestion and  rhinorrhea.   Respiratory: Negative for cough, chest tightness and shortness of breath.   Cardiovascular: Negative for chest pain.  Gastrointestinal: Negative for nausea, vomiting and abdominal pain.  Genitourinary: Negative for dysuria, hematuria, vaginal bleeding and vaginal discharge.  Musculoskeletal: Positive for myalgias and arthralgias.  Skin: Positive for wound. Negative for rash.  Neurological: Negative for dizziness, weakness and headaches.  A complete 10 system review of systems was obtained and all systems are negative except as noted in the HPI and PMH.      Allergies  Other  Home Medications   Prior to Admission medications   Medication Sig Start Date End Date Taking? Authorizing Provider  Ascorbic Acid (VITA-C) CRYS Take 1 tablet by mouth daily. 03/14/14  Yes Jonelle Sidle, MD  calcium-vitamin D (OSCAL WITH D) 500-200 MG-UNIT per tablet Take 2 tablets by mouth daily with breakfast.  03/14/14  Yes Jonelle Sidle, MD  folic acid (FOLVITE) 1 MG tablet Take 1 mg by mouth daily.   Yes Historical Provider, MD  gabapentin (NEURONTIN) 300 MG capsule Take 300 mg by mouth 3 (three) times daily.   Yes Historical Provider, MD  glipiZIDE (GLUCOTROL) 5 MG tablet Take 5 mg by mouth daily before breakfast.   Yes Historical Provider, MD  Iron TABS Take 1 tablet by mouth daily. 03/14/14  Yes Jonelle Sidle, MD  predniSONE (DELTASONE) 5 MG tablet Take 3 tablets (  15 mg total) by mouth daily with breakfast. 03/14/14  Yes Jonelle Sidle, MD  simvastatin (ZOCOR) 10 MG tablet Take 10 mg by mouth daily.   Yes Historical Provider, MD   BP 132/90 mmHg  Pulse 73  Temp(Src) 98.1 F (36.7 C) (Oral)  Resp 16  Ht  (1.676 m)  Wt 172 lb (78.019 kg)  BMI 27.77 kg/m2  SpO2 97% Physical Exam  Constitutional: She is oriented to person, place, and time. She appears well-developed and well-nourished. No distress.  HENT:  Head: Normocephalic and atraumatic.  Mouth/Throat: Oropharynx is  clear and moist. No oropharyngeal exudate.  Eyes: Conjunctivae and EOM are normal. Pupils are equal, round, and reactive to light.  Neck: Normal range of motion. Neck supple.  No meningismus.  Cardiovascular: Normal rate, regular rhythm, normal heart sounds and intact distal pulses.   No murmur heard. Pulmonary/Chest: Effort normal and breath sounds normal. No respiratory distress.  Abdominal: Soft. There is no tenderness. There is no rebound and no guarding.  Musculoskeletal: Normal range of motion. She exhibits edema. She exhibits no tenderness.  +1 pretibial edema bilaterally. There is a hematoma 5x5cm to right medial lower leg it is nontender. Small hematoma to left medial leg. 3x4 cm  Intact DP pulses unable to palpate PT pulses Chronic bilateral foot drop unable to extend her ankles or toes.  Neurological: She is alert and oriented to person, place, and time. No cranial nerve deficit. She exhibits normal muscle tone. Coordination normal.  No ataxia on finger to nose bilaterally. No pronator drift. 5/5 strength throughout other than chronic foot drop. CN 2-12 intact. Equal grip strength. Sensation intact.  Skin: Skin is warm.  Psychiatric: She has a normal mood and affect. Her behavior is normal.  Nursing note and vitals reviewed.   ED Course  Procedures (including critical care time) Labs Review Labs Reviewed  CBC WITH DIFFERENTIAL/PLATELET - Abnormal; Notable for the following:    Platelets 145 (*)    Neutrophils Relative % 90 (*)    Neutro Abs 8.7 (*)    Lymphocytes Relative 8 (*)    Monocytes Relative 2 (*)    All other components within normal limits  BASIC METABOLIC PANEL - Abnormal; Notable for the following:    Glucose, Bld 297 (*)    BUN 37 (*)    Creatinine, Ser 1.52 (*)    GFR calc non Af Amer 31 (*)    GFR calc Af Amer 36 (*)    All other components within normal limits  BRAIN NATRIURETIC PEPTIDE - Abnormal; Notable for the following:    B Natriuretic Peptide  223.0 (*)    All other components within normal limits    Imaging Review Dg Tibia/fibula Right  06/19/2015   CLINICAL DATA:  Right lower extremity swelling and erythema after flower pot was dropped on it 1 month ago. Initial encounter.  EXAM: RIGHT TIBIA AND FIBULA - 2 VIEW  COMPARISON:  None.  FINDINGS: Status post right total knee arthroplasty. No fracture or dislocation is noted. Focal soft tissue swelling is seen involving the medial soft tissues of the right calf most consistent with hematoma given history.  IMPRESSION: No fracture or dislocation is noted in the right tibia or fibula. Probable hematoma seen in medial soft tissues of the right calf.   Electronically Signed   By: Lupita Raider, M.D.   On: 06/19/2015 14:06   US Venous Img Lower Bilateral  06/19/2015   CLINICAL DATA:  Chronic  bilateral leg swelling. History of left lower extremity DVT.  EXAM: BILATERAL LOWER EXTREMITY VENOUS DOPPLER ULTRASOUND  TECHNIQUE: Gray-scale sonography with graded compression, as well as color Doppler and duplex ultrasound were performed to evaluate the lower extremity deep venous systems from the level of the common femoral vein and including the common femoral, femoral, profunda femoral, popliteal and calf veins including the posterior tibial, peroneal and gastrocnemius veins when visible. The superficial great saphenous vein was also interrogated. Spectral Doppler was utilized to evaluate flow at rest and with distal augmentation maneuvers in the common femoral, femoral and popliteal veins.  COMPARISON:  03/19/2014  FINDINGS: RIGHT LOWER EXTREMITY  Common Femoral Vein: No evidence of thrombus. Normal compressibility, respiratory phasicity and response to augmentation.  Saphenofemoral Junction: No evidence of thrombus. Normal compressibility and flow on color Doppler imaging.  Profunda Femoral Vein: No evidence of thrombus. Normal compressibility and flow on color Doppler imaging.  Femoral Vein: No evidence of  thrombus. Normal compressibility, respiratory phasicity and response to augmentation.  Popliteal Vein: No evidence of thrombus. Normal compressibility, respiratory phasicity and response to augmentation.  Calf Veins: No evidence of thrombus. Normal compressibility and flow on color Doppler imaging.  Superficial Great Saphenous Vein: No evidence of thrombus. Normal compressibility and flow on color Doppler imaging.  Venous Reflux:  None.  Other Findings: 3.7 x 12.0 x 6.0 cm fluid collection in the medial posterior calf compatible with large Baker's cyst.  LEFT LOWER EXTREMITY  Common Femoral Vein: No evidence of thrombus. Normal compressibility, respiratory phasicity and response to augmentation.  Saphenofemoral Junction: No evidence of thrombus. Normal compressibility and flow on color Doppler imaging.  Profunda Femoral Vein: No evidence of thrombus. Normal compressibility and flow on color Doppler imaging.  Femoral Vein: No evidence of thrombus. Normal compressibility, respiratory phasicity and response to augmentation.  Popliteal Vein: No evidence of thrombus. Normal compressibility, respiratory phasicity and response to augmentation.  Calf Veins: No evidence of thrombus. Normal compressibility and flow on color Doppler imaging.  Superficial Great Saphenous Vein: No evidence of thrombus. Normal compressibility and flow on color Doppler imaging.  Venous Reflux:  None.  Other Findings: 4.4 x 3.3 x 10.0 cm fluid collection in the medial posterior calf compatible with Baker's cyst.  IMPRESSION: No evidence of deep venous thrombosis.  Bilateral Baker cyst, right greater than left.   Electronically Signed   By: Charlett Nose M.D.   On: 06/19/2015 14:44     EKG Interpretation None      MDM   Final diagnoses:  Leg swelling   Bilateral leg swelling, right greater than left. Recent trauma to right leg with hematoma. Intact distal pulses.   labs show hyperglycemia without DKA. Intact PT pulses by Doppler.  Palpable DP pulses.  Ultrasound shows no DVT. They are Baker's cyst bilaterally.   Patient informed of mildly elevated creatinine and need for recheck. She states she is scheduled to see a kidney specialist. Advised to have creatinine checked again next week. Return to the ED with chest pain, shortness of breath or any other concerns.  Glynn Octave, MD 06/19/15 1719

## 2015-06-19 NOTE — ED Notes (Signed)
Pt ambulated very well with her walker.

## 2015-06-19 NOTE — Discharge Instructions (Signed)
Peripheral Edema There is no evidence of blood clot. Follow up with your doctor for a recheck of your kidney function. Return to the ED if you develop chest pain, shortness of breath, or any other concerns. You have swelling in your legs (peripheral edema). This swelling is due to excess accumulation of salt and water in your body. Edema may be a sign of heart, kidney or liver disease, or a side effect of a medication. It may also be due to problems in the leg veins. Elevating your legs and using special support stockings may be very helpful, if the cause of the swelling is due to poor venous circulation. Avoid long periods of standing, whatever the cause. Treatment of edema depends on identifying the cause. Chips, pretzels, pickles and other salty foods should be avoided. Restricting salt in your diet is almost always needed. Water pills (diuretics) are often used to remove the excess salt and water from your body via urine. These medicines prevent the kidney from reabsorbing sodium. This increases urine flow. Diuretic treatment may also result in lowering of potassium levels in your body. Potassium supplements may be needed if you have to use diuretics daily. Daily weights can help you keep track of your progress in clearing your edema. You should call your caregiver for follow up care as recommended. SEEK IMMEDIATE MEDICAL CARE IF:   You have increased swelling, pain, redness, or heat in your legs.  You develop shortness of breath, especially when lying down.  You develop chest or abdominal pain, weakness, or fainting.  You have a fever. Document Released: 01/21/2005 Document Revised: 03/07/2012 Document Reviewed: 01/01/2010 [facility] Patient Information 2015 [place], [place]. This information is not intended to replace advice given to you by your health care provider. Make sure you discuss any questions you have with your health care provider.

## 2015-06-19 NOTE — ED Notes (Signed)
Pt states that she has been having swelling in her lower legs.  Saw pcp this morning and was sent for evaluation.

## 2015-11-20 ENCOUNTER — Other Ambulatory Visit: Payer: Self-pay | Admitting: Orthopedic Surgery

## 2015-11-20 DIAGNOSIS — M9973 Connective tissue and disc stenosis of intervertebral foramina of lumbar region: Secondary | ICD-10-CM

## 2015-12-12 ENCOUNTER — Ambulatory Visit
Admission: RE | Admit: 2015-12-12 | Discharge: 2015-12-12 | Disposition: A | Discharge status: Home / Self Care | Payer: Medicare Other | Source: Ambulatory Visit | Attending: Orthopedic Surgery | Admitting: Orthopedic Surgery

## 2015-12-12 DIAGNOSIS — W1830XA Fall on same level, unspecified, initial encounter: Secondary | ICD-10-CM | POA: Diagnosis not present

## 2015-12-12 DIAGNOSIS — M5126 Other intervertebral disc displacement, lumbar region: Secondary | ICD-10-CM | POA: Insufficient documentation

## 2015-12-12 DIAGNOSIS — M4856XA Collapsed vertebra, not elsewhere classified, lumbar region, initial encounter for fracture: Secondary | ICD-10-CM | POA: Insufficient documentation

## 2015-12-12 DIAGNOSIS — M9973 Connective tissue and disc stenosis of intervertebral foramina of lumbar region: Secondary | ICD-10-CM

## 2016-01-28 LAB — HEMOGLOBIN A1C: HEMOGLOBIN A1C: 8.2

## 2016-02-21 ENCOUNTER — Ambulatory Visit (INDEPENDENT_AMBULATORY_CARE_PROVIDER_SITE_OTHER): Payer: Medicare Other | Admitting: "Endocrinology

## 2016-02-21 ENCOUNTER — Encounter: Payer: Self-pay | Admitting: "Endocrinology

## 2016-02-21 VITALS — BP 149/82 | HR 83 | Ht 66.0 in | Wt 167.0 lb

## 2016-02-21 DIAGNOSIS — E1122 Type 2 diabetes mellitus with diabetic chronic kidney disease: Secondary | ICD-10-CM

## 2016-02-21 DIAGNOSIS — E782 Mixed hyperlipidemia: Secondary | ICD-10-CM | POA: Diagnosis not present

## 2016-02-21 DIAGNOSIS — N183 Chronic kidney disease, stage 3 (moderate): Secondary | ICD-10-CM | POA: Diagnosis not present

## 2016-02-21 DIAGNOSIS — IMO0002 Reserved for concepts with insufficient information to code with codable children: Secondary | ICD-10-CM

## 2016-02-21 DIAGNOSIS — E1165 Type 2 diabetes mellitus with hyperglycemia: Secondary | ICD-10-CM | POA: Diagnosis not present

## 2016-02-21 NOTE — Progress Notes (Signed)
Subjective:    Patient ID: Linda Hess, female    DOB: 01/23/1931. Patient is being seen in consultation for management of diabetes requested by Calla Kicks, MD     Past Medical History  Diagnosis Date  . Type 2 diabetes mellitus (HCC)   . Essential hypertension, benign   . Mixed hyperlipidemia   . History of DVT (deep vein thrombosis)     Reportedly left leg, details not clear  . CIDP (chronic inflammatory demyelinating polyneuropathy) [facility])    Past Surgical History  Procedure Laterality Date  . Right knee replacement  September 2014  . Left quadriceps muscle biopsy  March 2010  . Cholecystectomy     Social History   Social History  . Marital Status: Widowed    Spouse Name: N/A  . Number of Children: N/A  . Years of Education: N/A   Social History Main Topics  . Smoking status: Never Smoker   . Smokeless tobacco: None  . Alcohol Use: No  . Drug Use: No  . Sexual Activity: Not Asked   Other Topics Concern  . None   Social History Narrative   Outpatient Encounter Prescriptions as of 80/24/2017  Medication Sig  . Ascorbic Acid (VITA-C) CRYS Take 1 tablet by mouth daily.  Marland Kitchen aspirin 81 MG tablet Take 81 mg by mouth daily.  . calcium-vitamin D (OSCAL WITH D) 500-200 MG-UNIT per tablet Take 2 tablets by mouth daily with breakfast.   . folic acid (FOLVITE) 1 MG tablet Take 1 mg by mouth daily.  Marland Kitchen gabapentin (NEURONTIN) 300 MG capsule Take 300 mg by mouth 2 (two) times daily.   Marland Kitchen glipiZIDE (GLUCOTROL) 5 MG tablet Take 5 mg by mouth daily with breakfast.  . Iron TABS Take 1 tablet by mouth daily.  . Omega-3 Fatty Acids (FISH OIL) 1200 MG CAPS Take by mouth daily.  . predniSONE (DELTASONE) 5 MG tablet Take 20 mg by mouth daily with breakfast.   . simvastatin (ZOCOR) 10 MG tablet Take 10 mg by mouth daily.   No facility-administered encounter medications on file as of 80/24/2017.   ALLERGIES: Allergies  Allergen Reactions  . Other Hives and Itching    RED  MEAT   VACCINATION STATUS:  There is no immunization history on file for this patient.  Diabetes She presents for her initial diabetic visit. She has type 2 diabetes mellitus. Onset time: She was diagnosed at approximate age of 82 years. Her disease course has been worsening. There are no hypoglycemic associated symptoms. Pertinent negatives for hypoglycemia include no confusion, headaches, pallor or seizures. Associated symptoms include fatigue, polydipsia and polyuria. Pertinent negatives for diabetes include no chest pain and no polyphagia. There are no hypoglycemic complications. Symptoms are worsening. There are no diabetic complications. Risk factors for coronary artery disease include diabetes mellitus, dyslipidemia, hypertension and sedentary lifestyle. Her weight is increasing steadily (She is on high-dose steroid currently prednisone 20 mg by mouth every day for diffuse neuromuscular disorder.). She is following a generally unhealthy diet. When asked about meal planning, she reported none. She has not had a previous visit with a dietitian. She never (She is limited to walking with a walker, cannot exercise optimally.) participates in exercise. Home blood sugar record trend: She admits she is not monitoring blood glucose in a regular basis.  Hyperlipidemia This is a chronic problem. The current episode started more than 1 year ago. Exacerbating diseases include diabetes. Associated symptoms include myalgias. Pertinent negatives include no chest pain  or shortness of breath. Current antihyperlipidemic treatment includes statins. Risk factors for coronary artery disease include dyslipidemia, diabetes mellitus, hypertension and a sedentary lifestyle.       Review of Systems  Constitutional: Positive for fatigue. Negative for fever, chills and unexpected weight change.  HENT: Negative for trouble swallowing and voice change.   Eyes: Negative for visual disturbance.  Respiratory: Negative for  cough, shortness of breath and wheezing.   Cardiovascular: Negative for chest pain, palpitations and leg swelling.  Gastrointestinal: Negative for nausea, vomiting and diarrhea.  Endocrine: Positive for polydipsia and polyuria. Negative for cold intolerance, heat intolerance and polyphagia.  Musculoskeletal: Positive for myalgias, back pain, arthralgias and gait problem.       Walks with a walker.   Skin: Positive for color change. Negative for pallor, rash and wound.       Diffuse skin bruises and hyperemia.  Neurological: Negative for seizures and headaches.  Psychiatric/Behavioral: Negative for suicidal ideas and confusion.    Objective:    BP 149/82 mmHg  Pulse 83  Ht  (1.676 m)  Wt 167 lb (75.751 kg)  BMI 26.97 kg/m2  SpO2 97%  Wt Readings from Last 3 Encounters:  02/21/16 167 lb (75.751 kg)  06/19/15 172 lb (78.019 kg)  03/14/14 176 lb 6.4 oz (80.015 kg)    Physical Exam  Constitutional: She is oriented to person, place, and time. She appears well-developed.  Walks with a walker.  HENT:  Head: Normocephalic and atraumatic.  Eyes: EOM are normal.  Neck: Normal range of motion. Neck supple. No tracheal deviation present. No thyromegaly present.  Cardiovascular: Normal rate and regular rhythm.   Pulmonary/Chest: Effort normal and breath sounds normal.  Abdominal: Soft. Bowel sounds are normal. There is no tenderness. There is no guarding.  Musculoskeletal: Normal range of motion. She exhibits no edema.  Walks with a walker.  Neurological: She is alert and oriented to person, place, and time. She has normal reflexes. No cranial nerve deficit. Coordination normal.  Skin: Skin is dry. No rash noted. There is erythema. No pallor.  She has defuse the skin bruises on upper extremities.   Psychiatric: She has a normal mood and affect. Judgment normal.     CMP ( most recent) CMP     Component Value Date/Time   NA 139 06/19/2015 1340   NA 139 08/30/2013 0508   K 4.4  06/19/2015 1340   K 4.3 08/31/2013 0443   CL 104 06/19/2015 1340   CL 107 08/30/2013 0508   CO2 23 06/19/2015 1340   CO2 26 08/30/2013 0508   GLUCOSE 297* 06/19/2015 1340   GLUCOSE 164* 08/30/2013 0508   BUN 37* 06/19/2015 1340   BUN 27* 08/30/2013 0508   CREATININE 1.52* 06/19/2015 1340   CREATININE 1.02 08/30/2013 0508   CALCIUM 9.4 06/19/2015 1340   CALCIUM 9.1 08/30/2013 0508   GFRNONAA 31* 06/19/2015 1340   GFRNONAA 52* 08/30/2013 0508   GFRAA 36* 06/19/2015 1340   GFRAA >60 08/30/2013 0508         Assessment & Plan:   1. Uncontrolled type 2 diabetes mellitus with stage 3 chronic kidney disease, without long-term current use of insulin (HCC)   - Patient has currently uncontrolled symptomatic type 2 DM since  82 years of age,  with most recent A1c of 8.2 %. Recent labs reviewed, showing stage 3 CKD.   Her diabetes is complicated by chronic knee disease and patient remains at a high risk for more acute  and chronic complications of diabetes which include CAD, CVA, CKD, retinopathy, and neuropathy. These are all discussed in detail with the patient.  - I have counseled the patient on diet management and weight loss, by adopting a carbohydrate restricted/protein rich diet.  - Suggestion is made for patient to avoid simple carbohydrates   from their diet including Cakes , Desserts, Ice Cream,  Soda (  diet and regular) , Sweet Tea , Candies,  Chips, Cookies, Artificial Sweeteners,   and "Sugar-free" Products . This will help patient to have stable blood glucose profile and potentially avoid unintended weight gain.  - I encouraged the patient to switch to  unprocessed or minimally processed complex starch and increased protein intake (animal or plant source), fruits, and vegetables.  - Patient is advised to stick to a routine mealtimes to eat 3 meals  a day and avoid unnecessary snacks ( to snack only to correct hypoglycemia).  - The patient will be scheduled with Norm Salt, RDN, CDE for individualized DM education.  - I have approached patient with the following individualized plan to manage diabetes and patient agrees:   -She hesitates to go on insulin. -She agrees to initiate strict monitoring of glucose  AC and HS, and return in 1 week for reevaluation. -If glycemic profile is significantly above target she will be reapproached for insulin therapy.  -Patient is encouraged to call clinic for blood glucose levels less than 70 or above 300 mg /dl. - Even though glipizide is not the best choice for her, I will lower it to 5 mg only with breakfast to minimize risk of inadvertent hypoglycemia. -If her glycemic profile is near target, she'll be considered for low-dose Januvia or Tradjenta without insulin.  -Patient is not a candidate for metformin and SGLT2 i due to CKD.  -The high-dose prednisone at 20 mg by mouth daily is going to be a big factor in her attempt to control diabetes. She may not be able to avoid insulin therapy for long.  - Patient specific target  A1c;  LDL, HDL, Triglycerides, and  Waist Circumference were discussed in detail.  2) BP/HTN: unControlled.  she will be considered for low-dose ace inhibitors next visit.   3) Lipids/HPL:   continue statins. 4)  Weight/Diet: CDE Consult will be initiated , exercise, and detailed carbohydrates information provided.  5) Chronic Care/Health Maintenance:  -Patient is on  Statin medications and encouraged to continue to follow up with Ophthalmology, Podiatrist at least yearly or according to recommendations, and advised to   stay away from smoking. I have recommended yearly flu vaccine and pneumonia vaccination at least every 5 years; moderate intensity exercise for up to 150 minutes weekly; and  sleep for at least 7 hours a day.  - 60 minutes of time was spent on the care of this patient , 50% of which was applied for counseling on diabetes complications and their preventions.  - Patient to  bring meter and  blood glucose logs during their next visit.  - I advised patient to maintain close follow up with No primary care provider on file. for primary care needs.  Follow up plan: - Return in about 1 week (around 02/28/2016) for diabetes, high blood pressure, high cholesterol, follow up with meter and logs- no labs.  Marquis Lunch, MD Phone: [phone]  Fax: [phone]   02/21/2016, 1:43 PM

## 2016-02-21 NOTE — Patient Instructions (Signed)

## 2016-03-04 ENCOUNTER — Encounter: Payer: Self-pay | Admitting: "Endocrinology

## 2016-03-04 ENCOUNTER — Ambulatory Visit (INDEPENDENT_AMBULATORY_CARE_PROVIDER_SITE_OTHER): Payer: Medicare Other | Admitting: "Endocrinology

## 2016-03-04 VITALS — BP 132/80 | HR 74 | Ht 66.0 in | Wt 164.0 lb

## 2016-03-04 DIAGNOSIS — IMO0002 Reserved for concepts with insufficient information to code with codable children: Secondary | ICD-10-CM

## 2016-03-04 DIAGNOSIS — E782 Mixed hyperlipidemia: Secondary | ICD-10-CM

## 2016-03-04 DIAGNOSIS — N183 Chronic kidney disease, stage 3 (moderate): Secondary | ICD-10-CM

## 2016-03-04 DIAGNOSIS — E1122 Type 2 diabetes mellitus with diabetic chronic kidney disease: Secondary | ICD-10-CM

## 2016-03-04 DIAGNOSIS — I10 Essential (primary) hypertension: Secondary | ICD-10-CM

## 2016-03-04 DIAGNOSIS — E1165 Type 2 diabetes mellitus with hyperglycemia: Secondary | ICD-10-CM | POA: Diagnosis not present

## 2016-03-04 MED ORDER — SITAGLIPTIN PHOSPHATE 25 MG PO TABS: 25.0000 mg | ORAL_TABLET | Freq: Every day | ORAL | Status: DC

## 2016-03-04 NOTE — Progress Notes (Signed)
Subjective:    Patient ID: Linda Hess, female    DOB: 1931/04/03. Patient is Here to follow-up for  management of diabetes requested by Calla Kicks, MD     Past Medical History  Diagnosis Date  . Type 2 diabetes mellitus (HCC)   . Essential hypertension, benign   . Mixed hyperlipidemia   . History of DVT (deep vein thrombosis)     Reportedly left leg, details not clear  . CIDP (chronic inflammatory demyelinating polyneuropathy) [facility])    Past Surgical History  Procedure Laterality Date  . Right knee replacement  September 2014  . Left quadriceps muscle biopsy  March 2010  . Cholecystectomy     Social History   Social History  . Marital Status: Widowed    Spouse Name: N/A  . Number of Children: N/A  . Years of Education: N/A   Social History Main Topics  . Smoking status: Never Smoker   . Smokeless tobacco: None  . Alcohol Use: No  . Drug Use: No  . Sexual Activity: Not Asked   Other Topics Concern  . None   Social History Narrative   Outpatient Encounter Prescriptions as of 03/04/2016  Medication Sig  . Ascorbic Acid (VITA-C) CRYS Take 1 tablet by mouth daily.  Marland Kitchen aspirin 81 MG tablet Take 81 mg by mouth daily.  . calcium-vitamin D (OSCAL WITH D) 500-200 MG-UNIT per tablet Take 2 tablets by mouth daily with breakfast.   . folic acid (FOLVITE) 1 MG tablet Take 1 mg by mouth daily.  Marland Kitchen gabapentin (NEURONTIN) 300 MG capsule Take 300 mg by mouth 2 (two) times daily.   Marland Kitchen glipiZIDE (GLUCOTROL) 5 MG tablet Take 5 mg by mouth daily with breakfast.  . Iron TABS Take 1 tablet by mouth daily.  . Omega-3 Fatty Acids (FISH OIL) 1200 MG CAPS Take by mouth daily.  . predniSONE (DELTASONE) 5 MG tablet Take 20 mg by mouth daily with breakfast.   . simvastatin (ZOCOR) 10 MG tablet Take 10 mg by mouth daily.  . sitaGLIPtin (JANUVIA) 25 MG tablet Take 1 tablet (25 mg total) by mouth daily.   No facility-administered encounter medications on file as of 03/04/2016.    ALLERGIES: Allergies  Allergen Reactions  . Other Hives and Itching    RED MEAT   VACCINATION STATUS:  There is no immunization history on file for this patient.  Diabetes She presents for her follow-up diabetic visit. She has type 2 diabetes mellitus. Onset time: She was diagnosed at approximate age of 82 years. Her disease course has been improving. There are no hypoglycemic associated symptoms. Pertinent negatives for hypoglycemia include no confusion, headaches, pallor or seizures. Associated symptoms include fatigue. Pertinent negatives for diabetes include no chest pain, no polydipsia, no polyphagia and no polyuria. There are no hypoglycemic complications. Symptoms are improving. There are no diabetic complications. Risk factors for coronary artery disease include diabetes mellitus, dyslipidemia, hypertension and sedentary lifestyle. Her weight is increasing steadily (She is on high-dose steroid currently prednisone 20 mg by mouth every day for diffuse neuromuscular disorder.). She is following a generally unhealthy diet. When asked about meal planning, she reported none. She has not had a previous visit with a dietitian. She never (She is limited to walking with a walker, cannot exercise optimally.) participates in exercise. Her breakfast blood glucose range is generally 140-180 mg/dl. Her lunch blood glucose range is generally 140-180 mg/dl. Her dinner blood glucose range is generally 180-200 mg/dl. Her overall blood  glucose range is 140-180 mg/dl.  Hyperlipidemia This is a chronic problem. The current episode started more than 1 year ago. Exacerbating diseases include diabetes. Associated symptoms include myalgias. Pertinent negatives include no chest pain or shortness of breath. Current antihyperlipidemic treatment includes statins. Risk factors for coronary artery disease include dyslipidemia, diabetes mellitus, hypertension and a sedentary lifestyle.       Review of Systems   Constitutional: Positive for fatigue. Negative for fever, chills and unexpected weight change.  HENT: Negative for trouble swallowing and voice change.   Eyes: Negative for visual disturbance.  Respiratory: Negative for cough, shortness of breath and wheezing.   Cardiovascular: Negative for chest pain, palpitations and leg swelling.  Gastrointestinal: Negative for nausea, vomiting and diarrhea.  Endocrine: Negative for cold intolerance, heat intolerance, polydipsia, polyphagia and polyuria.  Musculoskeletal: Positive for myalgias, back pain, arthralgias and gait problem.       Walks with a walker.   Skin: Positive for color change. Negative for pallor, rash and wound.       Diffuse skin bruises and hyperemia.  Neurological: Negative for seizures and headaches.  Psychiatric/Behavioral: Negative for suicidal ideas and confusion.    Objective:    BP 132/80 mmHg  Pulse 74  Ht 5\' 6"  (1.676 m)  Wt 164 lb (74.39 kg)  BMI 26.48 kg/m2  SpO2 95%  Wt Readings from Last 3 Encounters:  03/04/16 164 lb (74.39 kg)  02/21/16 167 lb (75.751 kg)  06/19/15 172 lb (78.019 kg)    Physical Exam  Constitutional: She is oriented to person, place, and time. She appears well-developed.  Walks with a walker.  HENT:  Head: Normocephalic and atraumatic.  Eyes: EOM are normal.  Neck: Normal range of motion. Neck supple. No tracheal deviation present. No thyromegaly present.  Cardiovascular: Normal rate and regular rhythm.   Pulmonary/Chest: Effort normal and breath sounds normal.  Abdominal: Soft. Bowel sounds are normal. There is no tenderness. There is no guarding.  Musculoskeletal: Normal range of motion. She exhibits no edema.  Walks with a walker.  Neurological: She is alert and oriented to person, place, and time. She has normal reflexes. No cranial nerve deficit. Coordination normal.  Skin: Skin is dry. No rash noted. There is erythema. No pallor.  She has defuse the skin bruises on upper  extremities.   Psychiatric: She has a normal mood and affect. Judgment normal.     CMP ( most recent) CMP     Component Value Date/Time   NA 139 06/19/2015 1340   NA 139 08/30/2013 0508   K 4.4 06/19/2015 1340   K 4.3 08/31/2013 0443   CL 104 06/19/2015 1340   CL 107 08/30/2013 0508   CO2 23 06/19/2015 1340   CO2 26 08/30/2013 0508   GLUCOSE 297* 06/19/2015 1340   GLUCOSE 164* 08/30/2013 0508   BUN 37* 06/19/2015 1340   BUN 27* 08/30/2013 0508   CREATININE 1.52* 06/19/2015 1340   CREATININE 1.02 08/30/2013 0508   CALCIUM 9.4 06/19/2015 1340   CALCIUM 9.1 08/30/2013 0508   GFRNONAA 31* 06/19/2015 1340   GFRNONAA 52* 08/30/2013 0508   GFRAA 36* 06/19/2015 1340   GFRAA >60 08/30/2013 0508   A1c from January 2017 was 8.2%.    Assessment & Plan:   1. Uncontrolled type 2 diabetes mellitus with stage 3 chronic kidney disease, without long-term current use of insulin (HCC)   - Patient has currently uncontrolled symptomatic type 2 DM since  82 years of age,  with  most recent A1c of 8.2 %. Recent labs reviewed, showing stage 3 CKD. -She came with better and consistently slightly above target blood glucose readings.   Her diabetes is complicated by chronic knee disease and patient remains at a high risk for more acute and chronic complications of diabetes which include CAD, CVA, CKD, retinopathy, and neuropathy. These are all discussed in detail with the patient.  - I have counseled the patient on diet management and weight loss, by adopting a carbohydrate restricted/protein rich diet.  - Suggestion is made for patient to avoid simple carbohydrates   from their diet including Cakes , Desserts, Ice Cream,  Soda (  diet and regular) , Sweet Tea , Candies,  Chips, Cookies, Artificial Sweeteners,   and "Sugar-free" Products . This will help patient to have stable blood glucose profile and potentially avoid unintended weight gain.  - I encouraged the patient to switch to  unprocessed  or minimally processed complex starch and increased protein intake (animal or plant source), fruits, and vegetables.  - Patient is advised to stick to a routine mealtimes to eat 3 meals  a day and avoid unnecessary snacks ( to snack only to correct hypoglycemia).  - The patient will be scheduled with Norm Salt, RDN, CDE for individualized DM education.  - I have approached patient with the following individualized plan to manage diabetes and patient agrees:   -Based on her blood glucose profile she will not need insulin therapy for now. -I will add Januvia 25 mg by mouth daily. -She agrees to monitoring of glucose  AC  breakfast . -Patient is encouraged to call clinic for blood glucose levels less than 70 or above 300 mg /dl. - Even though glipizide is not the best choice for her, I will lower it to 5 mg only with breakfast to minimize risk of inadvertent hypoglycemia.  -Patient is not a candidate for metformin and SGLT2 i due to CKD.  -The high-dose prednisone at 20 mg by mouth daily is going to be a big factor in her attempt to control diabetes. She may not be able to avoid insulin therapy for long.  - Patient specific target  A1c;  LDL, HDL, Triglycerides, and  Waist Circumference were discussed in detail.  2) BP/HTN: unControlled.  she will be considered for low-dose ace inhibitors next visit.   3) Lipids/HPL:   continue statins. 4)  Weight/Diet: CDE Consult will be initiated , exercise, and detailed carbohydrates information provided.  5) Chronic Care/Health Maintenance:  -Patient is on  Statin medications and encouraged to continue to follow up with Ophthalmology, Podiatrist at least yearly or according to recommendations, and advised to   stay away from smoking. I have recommended yearly flu vaccine and pneumonia vaccination at least every 5 years; moderate intensity exercise for up to 150 minutes weekly; and  sleep for at least 7 hours a day.  - 25 minutes of time was spent  on the care of this patient , 50% of which was applied for counseling on diabetes complications and their preventions.  - Patient to bring meter and  blood glucose logs during their next visit.  - I advised patient to maintain close follow up with [facility] for primary care needs.  Follow up plan: - Return in about 8 weeks (around 04/29/2016) for diabetes, high blood pressure, high cholesterol, follow up with pre-visit labs, meter, and logs.  Marquis Lunch, MD Phone: [phone]  Fax: [phone]   03/04/2016, 10:50  AM

## 2016-03-26 ENCOUNTER — Ambulatory Visit: Payer: Medicare Other | Admitting: Nutrition

## 2016-04-28 ENCOUNTER — Other Ambulatory Visit: Payer: Self-pay | Admitting: "Endocrinology

## 2016-04-28 LAB — BASIC METABOLIC PANEL
BUN: 38 mg/dL — ABNORMAL HIGH (ref 7–25)
CO2: 25 mmol/L (ref 20–31)
Calcium: 9.2 mg/dL (ref 8.6–10.4)
Chloride: 107 mmol/L (ref 98–110)
Creat: 1.34 mg/dL — ABNORMAL HIGH (ref 0.60–0.88)
GLUCOSE: 137 mg/dL — AB (ref 65–99)
POTASSIUM: 4.1 mmol/L (ref 3.5–5.3)
Sodium: 143 mmol/L (ref 135–146)

## 2016-04-28 LAB — LIPID PANEL
Cholesterol: 148 mg/dL (ref 125–200)
HDL: 63 mg/dL (ref >=46)
LDL Cholesterol: 49 mg/dL (ref <130)
Total CHOL/HDL Ratio: 2.3 Ratio (ref <=5.0)
Triglycerides: 178 mg/dL — ABNORMAL HIGH (ref <150)
VLDL: 36 mg/dL — ABNORMAL HIGH (ref <30)

## 2016-04-28 LAB — TSH: TSH: 1.41 mIU/L

## 2016-04-28 LAB — HEMOGLOBIN A1C
Hgb A1c MFr Bld: 7.3 % — ABNORMAL HIGH (ref <5.7)
Mean Plasma Glucose: 163 mg/dL

## 2016-04-28 LAB — T4, FREE: Free T4: 1.2 ng/dL (ref 0.8–1.8)

## 2016-05-05 ENCOUNTER — Ambulatory Visit (INDEPENDENT_AMBULATORY_CARE_PROVIDER_SITE_OTHER): Payer: Medicare Other | Admitting: "Endocrinology

## 2016-05-05 ENCOUNTER — Encounter: Payer: Self-pay | Admitting: "Endocrinology

## 2016-05-05 VITALS — BP 142/82 | HR 68 | Ht 66.0 in | Wt 163.0 lb

## 2016-05-05 DIAGNOSIS — N183 Chronic kidney disease, stage 3 (moderate): Secondary | ICD-10-CM | POA: Diagnosis not present

## 2016-05-05 DIAGNOSIS — E1122 Type 2 diabetes mellitus with diabetic chronic kidney disease: Secondary | ICD-10-CM | POA: Diagnosis not present

## 2016-05-05 DIAGNOSIS — I10 Essential (primary) hypertension: Secondary | ICD-10-CM | POA: Diagnosis not present

## 2016-05-05 DIAGNOSIS — E1165 Type 2 diabetes mellitus with hyperglycemia: Secondary | ICD-10-CM

## 2016-05-05 DIAGNOSIS — IMO0002 Reserved for concepts with insufficient information to code with codable children: Secondary | ICD-10-CM

## 2016-05-05 DIAGNOSIS — E782 Mixed hyperlipidemia: Secondary | ICD-10-CM | POA: Diagnosis not present

## 2016-05-05 MED ORDER — SITAGLIPTIN PHOSPHATE 25 MG PO TABS: 25.0000 mg | ORAL_TABLET | Freq: Every day | ORAL | Status: DC

## 2016-05-05 NOTE — Progress Notes (Signed)
Subjective:    Patient ID: Linda Hess, female    DOB: Jan 08, 1931. Patient is Here to follow-up for  management of diabetes requested by Calla Kicks, MD     Past Medical History  Diagnosis Date  . Type 2 diabetes mellitus (HCC)   . Essential hypertension, benign   . Mixed hyperlipidemia   . History of DVT (deep vein thrombosis)     Reportedly left leg, details not clear  . CIDP (chronic inflammatory demyelinating polyneuropathy) [facility]    Past Surgical History  Procedure Laterality Date  . Right knee replacement  September 2014  . Left quadriceps muscle biopsy  March 2010  . Cholecystectomy     Social History   Social History  . Marital Status: Widowed    Spouse Name: N/A  . Number of Children: N/A  . Years of Education: N/A   Social History Main Topics  . Smoking status: Never Smoker   . Smokeless tobacco: None  . Alcohol Use: No  . Drug Use: No  . Sexual Activity: Not Asked   Other Topics Concern  . None   Social History Narrative   Outpatient Encounter Prescriptions as of 05/05/2016  Medication Sig  . Ascorbic Acid (VITA-C) CRYS Take 1 tablet by mouth daily.  Marland Kitchen aspirin 81 MG tablet Take 81 mg by mouth daily.  . calcium-vitamin D (OSCAL WITH D) 500-200 MG-UNIT per tablet Take 2 tablets by mouth daily with breakfast.   . folic acid (FOLVITE) 1 MG tablet Take 1 mg by mouth daily.  Marland Kitchen gabapentin (NEURONTIN) 300 MG capsule Take 300 mg by mouth 2 (two) times daily.   . Iron TABS Take 1 tablet by mouth daily.  . Omega-3 Fatty Acids (FISH OIL) 1200 MG CAPS Take by mouth daily.  . predniSONE (DELTASONE) 5 MG tablet Take 20 mg by mouth daily with breakfast.   . simvastatin (ZOCOR) 10 MG tablet Take 10 mg by mouth daily.  . sitaGLIPtin (JANUVIA) 25 MG tablet Take 1 tablet (25 mg total) by mouth daily.  . [DISCONTINUED] glipiZIDE (GLUCOTROL) 5 MG tablet Take 5 mg by mouth daily with breakfast.  . [DISCONTINUED] sitaGLIPtin (JANUVIA) 25 MG tablet Take 1 tablet  (25 mg total) by mouth daily.   No facility-administered encounter medications on file as of 05/05/2016.   ALLERGIES: Allergies  Allergen Reactions  . Other Hives and Itching    RED MEAT   VACCINATION STATUS:  There is no immunization history on file for this patient.  Diabetes She presents for her follow-up diabetic visit. She has type 2 diabetes mellitus. Onset time: She was diagnosed at approximate age of 62 years. Her disease course has been improving. There are no hypoglycemic associated symptoms. Pertinent negatives for hypoglycemia include no confusion, headaches, pallor or seizures. Associated symptoms include fatigue. Pertinent negatives for diabetes include no chest pain, no polydipsia, no polyphagia and no polyuria. There are no hypoglycemic complications. Symptoms are improving. There are no diabetic complications. Risk factors for coronary artery disease include diabetes mellitus, dyslipidemia, hypertension and sedentary lifestyle. Her weight is increasing steadily (She is on high-dose steroid currently prednisone 20 mg by mouth every day for diffuse neuromuscular disorder.). She is following a generally unhealthy diet. When asked about meal planning, she reported none. She has not had a previous visit with a dietitian. She never (She is limited to walking with a walker, cannot exercise optimally.) participates in exercise. Her breakfast blood glucose range is generally 90-110 mg/dl.  Hyperlipidemia This  is a chronic problem. The current episode started more than 1 year ago. Exacerbating diseases include diabetes. Associated symptoms include myalgias. Pertinent negatives include no chest pain or shortness of breath. Current antihyperlipidemic treatment includes statins. Risk factors for coronary artery disease include dyslipidemia, diabetes mellitus, hypertension and a sedentary lifestyle.       Review of Systems  Constitutional: Positive for fatigue. Negative for fever, chills and  unexpected weight change.  HENT: Negative for trouble swallowing and voice change.   Eyes: Negative for visual disturbance.  Respiratory: Negative for cough, shortness of breath and wheezing.   Cardiovascular: Negative for chest pain, palpitations and leg swelling.  Gastrointestinal: Negative for nausea, vomiting and diarrhea.  Endocrine: Negative for cold intolerance, heat intolerance, polydipsia, polyphagia and polyuria.  Musculoskeletal: Positive for myalgias, back pain, arthralgias and gait problem.       Walks with a walker.   Skin: Positive for color change. Negative for pallor, rash and wound.       Diffuse skin bruises and hyperemia.  Neurological: Negative for seizures and headaches.  Psychiatric/Behavioral: Negative for suicidal ideas and confusion.    Objective:    BP 142/82 mmHg  Pulse 68  Ht  (1.676 m)  Wt 163 lb (73.936 kg)  BMI 26.32 kg/m2  SpO2 96%  Wt Readings from Last 3 Encounters:  05/05/16 163 lb (73.936 kg)  03/04/16 164 lb (74.39 kg)  02/21/16 167 lb (75.751 kg)    Physical Exam  Constitutional: She is oriented to person, place, and time. She appears well-developed.  Walks with a walker.  HENT:  Head: Normocephalic and atraumatic.  Eyes: EOM are normal.  Neck: Normal range of motion. Neck supple. No tracheal deviation present. No thyromegaly present.  Cardiovascular: Normal rate and regular rhythm.   Pulmonary/Chest: Effort normal and breath sounds normal.  Abdominal: Soft. Bowel sounds are normal. There is no tenderness. There is no guarding.  Musculoskeletal: Normal range of motion. She exhibits no edema.  Walks with a walker.  Neurological: She is alert and oriented to person, place, and time. She has normal reflexes. No cranial nerve deficit. Coordination normal.  Skin: Skin is dry. No rash noted. There is erythema. No pallor.  She has defuse the skin bruises on upper extremities.   Psychiatric: She has a normal mood and affect. Judgment  normal.   Recent Results (from the past 2160 hour(s))  Basic metabolic panel     Status: Abnormal   Collection Time: 04/28/16  9:25 AM  Result Value Ref Range   Sodium 143 135 - 146 mmol/L   Potassium 4.1 3.5 - 5.3 mmol/L   Chloride 107 98 - 110 mmol/L   CO2 25 20 - 31 mmol/L   Glucose, Bld 137 (H) 65 - 99 mg/dL   BUN 38 (H) 7 - 25 mg/dL   Creat 6.04 (H) 5.40 - 0.88 mg/dL   Calcium 9.2 8.6 - 98.1 mg/dL  Lipid panel     Status: Abnormal   Collection Time: 04/28/16  9:25 AM  Result Value Ref Range   Cholesterol 148 125 - 200 mg/dL   Triglycerides 191 (H) <150 mg/dL   HDL 63 >=47 mg/dL   Total CHOL/HDL Ratio 2.3 <=5.0 Ratio   VLDL 36 (H) <30 mg/dL   LDL Cholesterol 49 <829 mg/dL    Comment:   Total Cholesterol/HDL Ratio:CHD Risk  Coronary Heart Disease Risk Table                                        Men       Women          1/2 Average Risk              3.4        3.3              Average Risk              5.0        4.4           2X Average Risk              9.6        7.1           3X Average Risk             23.4       11.0 Use the calculated Patient Ratio above and the CHD Risk table  to determine the patient's CHD Risk.   TSH     Status: None   Collection Time: 04/28/16  9:25 AM  Result Value Ref Range   TSH 1.41 mIU/L    Comment:   Reference Range   > or = 20 Years  0.40-4.50   Pregnancy Range First trimester  0.26-2.66 Second trimester 0.55-2.73 Third trimester  0.43-2.91     T4, free     Status: None   Collection Time: 04/28/16  9:25 AM  Result Value Ref Range   Free T4 1.2 0.8 - 1.8 ng/dL  Hemoglobin [id number]     Status: Abnormal   Collection Time: 04/28/16  9:25 AM  Result Value Ref Range   Hgb A1c MFr Bld 7.3 (H) <5.7 %    Comment:   For someone without known diabetes, a hemoglobin A1c value of 6.5% or greater indicates that they may have diabetes and this should be confirmed with a follow-up test.   For someone with known  diabetes, a value <7% indicates that their diabetes is well controlled and a value greater than or equal to 7% indicates suboptimal control. A1c targets should be individualized based on duration of diabetes, age, comorbid conditions, and other considerations.   Currently, no consensus exists for use of hemoglobin A1c for diagnosis of diabetes for children.      Mean Plasma Glucose 163 mg/dL    Assessment & Plan:   1. Uncontrolled type 2 diabetes mellitus with stage 3 chronic kidney disease, without long-term current use of insulin (HCC)   - Patient has currently uncontrolled symptomatic type 2 DM since  82 years of age,  with most recent A1c of  7.3% improving from 8.2 %. Recent labs reviewed, showing stage 3 CKD. -She came with better and consistently slightly above target blood glucose readings.   Her diabetes is complicated by chronic knee disease and patient remains at a high risk for more acute and chronic complications of diabetes which include CAD, CVA, CKD, retinopathy, and neuropathy. These are all discussed in detail with the patient.  - I have counseled the patient on diet management and weight loss, by adopting a carbohydrate restricted/protein rich diet.  - Suggestion is made for patient to avoid simple carbohydrates   from their diet including Cakes , Desserts, Ice Cream,  Soda (  diet and regular) , Sweet Tea , Candies,  Chips, Cookies, Artificial Sweeteners,   and "Sugar-free" Products . This will help patient to have stable blood glucose profile and potentially avoid unintended weight gain.  - I encouraged the patient to switch to  unprocessed or minimally processed complex starch and increased protein intake (animal or plant source), fruits, and vegetables.  - Patient is advised to stick to a routine mealtimes to eat 3 meals  a day and avoid unnecessary snacks ( to snack only to correct hypoglycemia).  - The patient will be scheduled with Norm Salt, RDN, CDE for  individualized DM education.  - I have approached patient with the following individualized plan to manage diabetes and patient agrees:   -Based on her blood glucose profile she will not need insulin therapy for now. -I will continue Januvia 25 mg by mouth daily. - I advised her to discontinue her glipizide to minimize risk of inadvertent hypoglycemia.  -Patient is not a candidate for metformin and SGLT2 i due to CKD.  -The high-dose prednisone at 20 mg by mouth daily is going to be a big factor in her attempt to control diabetes. She may not be able to avoid insulin therapy for long.  - Patient specific target  A1c;  LDL, HDL, Triglycerides, and  Waist Circumference were discussed in detail.  2) BP/HTN: unControlled.  she will be considered for low-dose ace inhibitors next visit.   3) Lipids/HPL:   continue statins. 4)  Weight/Diet: CDE Consult will be initiated , exercise, and detailed carbohydrates information provided.  5) Chronic Care/Health Maintenance:  -Patient is on  Statin medications and encouraged to continue to follow up with Ophthalmology, Podiatrist at least yearly or according to recommendations, and advised to   stay away from smoking. I have recommended yearly flu vaccine and pneumonia vaccination at least every 5 years; moderate intensity exercise for up to 150 minutes weekly; and  sleep for at least 7 hours a day.  - 25 minutes of time was spent on the care of this patient , 50% of which was applied for counseling on diabetes complications and their preventions.  - Patient to bring meter and  blood glucose logs during their next visit.  - I advised patient to maintain close follow up with [facility] for primary care needs.  Follow up plan: - Return in about 3 months (around 08/05/2016) for diabetes, high blood pressure, high cholesterol, follow up with pre-visit labs.  Marquis Lunch, MD Phone: [phone]  Fax: [phone]   05/05/2016,  2:35 PM

## 2016-07-01 ENCOUNTER — Encounter (HOSPITAL_COMMUNITY): Payer: Self-pay | Admitting: Emergency Medicine

## 2016-07-01 ENCOUNTER — Emergency Department (HOSPITAL_COMMUNITY)
Admission: EM | Admit: 2016-07-01 | Discharge: 2016-07-01 | Disposition: A | Discharge status: Home / Self Care | Payer: Medicare Other | Attending: Emergency Medicine | Admitting: Emergency Medicine

## 2016-07-01 ENCOUNTER — Emergency Department (HOSPITAL_COMMUNITY): Payer: Medicare Other

## 2016-07-01 DIAGNOSIS — Z79899 Other long term (current) drug therapy: Secondary | ICD-10-CM | POA: Diagnosis not present

## 2016-07-01 DIAGNOSIS — I10 Essential (primary) hypertension: Secondary | ICD-10-CM | POA: Diagnosis not present

## 2016-07-01 DIAGNOSIS — Z7982 Long term (current) use of aspirin: Secondary | ICD-10-CM | POA: Diagnosis not present

## 2016-07-01 DIAGNOSIS — E782 Mixed hyperlipidemia: Secondary | ICD-10-CM | POA: Insufficient documentation

## 2016-07-01 DIAGNOSIS — E119 Type 2 diabetes mellitus without complications: Secondary | ICD-10-CM | POA: Diagnosis not present

## 2016-07-01 DIAGNOSIS — M7989 Other specified soft tissue disorders: Secondary | ICD-10-CM | POA: Diagnosis present

## 2016-07-01 DIAGNOSIS — I824Z1 Acute embolism and thrombosis of unspecified deep veins of right distal lower extremity: Secondary | ICD-10-CM | POA: Diagnosis not present

## 2016-07-01 DIAGNOSIS — I82401 Acute embolism and thrombosis of unspecified deep veins of right lower extremity: Secondary | ICD-10-CM

## 2016-07-01 LAB — CBC WITH DIFFERENTIAL/PLATELET
BASOS PCT: 0 %
Basophils Absolute: 0 10*3/uL (ref 0.0–0.1)
EOS ABS: 0.1 10*3/uL (ref 0.0–0.7)
Eosinophils Relative: 1 %
HCT: 39.7 % (ref 36.0–46.0)
HEMOGLOBIN: 12.7 g/dL (ref 12.0–15.0)
LYMPHS ABS: 0.8 10*3/uL (ref 0.7–4.0)
Lymphocytes Relative: 6 %
MCH: 30.4 pg (ref 26.0–34.0)
MCHC: 32 g/dL (ref 30.0–36.0)
MCV: 95 fL (ref 78.0–100.0)
Monocytes Absolute: 0.7 10*3/uL (ref 0.1–1.0)
Monocytes Relative: 5 %
NEUTROS PCT: 88 %
Neutro Abs: 11.4 10*3/uL — ABNORMAL HIGH (ref 1.7–7.7)
Platelets: 88 10*3/uL — ABNORMAL LOW (ref 150–400)
RBC: 4.18 MIL/uL (ref 3.87–5.11)
RDW: 13.6 % (ref 11.5–15.5)
WBC: 13 10*3/uL — AB (ref 4.0–10.5)

## 2016-07-01 LAB — BASIC METABOLIC PANEL
Anion gap: 9 (ref 5–15)
BUN: 50 mg/dL — ABNORMAL HIGH (ref 6–20)
CHLORIDE: 105 mmol/L (ref 101–111)
CO2: 23 mmol/L (ref 22–32)
CREATININE: 1.35 mg/dL — AB (ref 0.44–1.00)
Calcium: 9.2 mg/dL (ref 8.9–10.3)
GFR calc non Af Amer: 35 mL/min — ABNORMAL LOW (ref >60)
GFR, EST AFRICAN AMERICAN: 41 mL/min — AB (ref >60)
Glucose, Bld: 208 mg/dL — ABNORMAL HIGH (ref 65–99)
Potassium: 4.2 mmol/L (ref 3.5–5.1)
SODIUM: 137 mmol/L (ref 135–145)

## 2016-07-01 MED ORDER — RIVAROXABAN (XARELTO) VTE STARTER PACK (15 & 20 MG): 1.0000 | ORAL_TABLET | Freq: Every day | ORAL | Status: DC

## 2016-07-01 NOTE — Discharge Instructions (Signed)
Information on my medicine - XARELTO (rivaroxaban)  This medication education was reviewed with me or my healthcare representative as part of my discharge preparation.  The pharmacist that spoke with me during my hospital stay was:  Wayland DenisHall, Tressa Maldonado A, [facility] LPRPH  WHY WAS XARELTO PRESCRIBED FOR YOU? Xarelto was prescribed to treat blood clots that may have been found in the veins of your legs (deep vein thrombosis) or in your lungs (pulmonary embolism) and to reduce the risk of them occurring again.  What do you need to know about Xarelto? The starting dose is one 15 mg tablet taken TWICE daily with food for the FIRST 21 DAYS then on (enter date)  07/22/16  the dose is changed to one 20 mg tablet taken ONCE A DAY with your evening meal.  DO NOT stop taking Xarelto without talking to the health care provider who prescribed the medication.  Refill your prescription for 20 mg tablets before you run out.  After discharge, you should have regular check-up appointments with your healthcare provider that is prescribing your Xarelto.  In the future your dose may need to be changed if your kidney function changes by a significant amount.  What do you do if you miss a dose? If you are taking Xarelto TWICE DAILY and you miss a dose, take it as soon as you remember. You may take two 15 mg tablets (total 30 mg) at the same time then resume your regularly scheduled 15 mg twice daily the next day.  If you are taking Xarelto ONCE DAILY and you miss a dose, take it as soon as you remember on the same day then continue your regularly scheduled once daily regimen the next day. Do not take two doses of Xarelto at the same time.   Important Safety Information Xarelto is a blood thinner medicine that can cause bleeding. You should call your healthcare provider right away if you experience any of the following: ? Bleeding from an injury or your nose that does not stop. ? Unusual colored urine (red or dark brown) or  unusual colored stools (red or black). ? Unusual bruising for unknown reasons. ? A serious fall or if you hit your head (even if there is no bleeding).  Some medicines may interact with Xarelto and might increase your risk of bleeding while on Xarelto. To help avoid this, consult your healthcare provider or pharmacist prior to using any new prescription or non-prescription medications, including herbals, vitamins, non-steroidal anti-inflammatory drugs (NSAIDs) and supplements.  This website has more information on Xarelto: [email or web address].xarelto.[email or web address].

## 2016-07-01 NOTE — ED Provider Notes (Signed)
CSN: [id number]     Arrival date & time 07/01/16  1040 History  By signing my name below, I, Placido Sou, attest that this documentation has been prepared under the direction and in the presence of Zadie Rhine, MD. Electronically Signed: Placido Sou, ED Scribe. 07/01/2016. 11:21 AM.   Chief Complaint  Patient presents with  . Leg Swelling   Patient is a 82 y.o. female presenting with leg pain. The history is provided by the patient. No language interpreter was used.  Leg Pain Location:  Leg Time since incident:  1 week Injury: no   Leg location:  R lower leg Pain details:    Radiates to:  Does not radiate   Severity:  Moderate   Onset quality:  Gradual   Duration:  1 week   Timing:  Constant   Progression:  Worsening Chronicity:  New Dislocation: no   Foreign body present:  No foreign bodies Prior injury to area:  No Relieved by:  Nothing Associated symptoms: swelling   Associated symptoms: no fever, no numbness and no tingling     HPI Comments: Linda Hess is a 82 y.o. female with a PMHx of DVT and DM who presents to the Emergency Department complaining of worsening, moderate, waxing and waning, RLE swelling x 1 week. Pt states that when waking in the morning her symptoms are slightly alleviated and progressively worsen throughout the day with them worsening overall for the past week. She was seen by her PCP earlier today who recommended she come to the ED for further evaluation. She reports mild pain across her RLE that presents with palpation as well as mild right ankle pain. Pt reports a PMHx of DVT in her LLE and denies currently being on blood thinning medications. She denies recent travel or any recent surgeries. Pt has a PMHx of CIPD and confirms the bruising and discoloration to her BLE is a chronic issue and denies it has acutely worsened. Pt confirms currently being ambulatory with difficulty. She denies falls, fevers, chills, CP, SOB, numbness, bloody stool and  bowel or bladder incontinence.   Past Medical History  Diagnosis Date  . Type 2 diabetes mellitus (HCC)   . Essential hypertension, benign   . Mixed hyperlipidemia   . History of DVT (deep vein thrombosis)     Reportedly left leg, details not clear  . CIDP (chronic inflammatory demyelinating polyneuropathy) [facility]    Past Surgical History  Procedure Laterality Date  . Right knee replacement  September 2014  . Left quadriceps muscle biopsy  March 2010  . Cholecystectomy     Family History  Problem Relation Age of Onset  . Heart attack Father   . Stroke Mother   . Cancer Brother    Social History  Substance Use Topics  . Smoking status: Never Smoker   . Smokeless tobacco: None  . Alcohol Use: No   OB History    No data available     Review of Systems  Constitutional: Negative for fever and chills.  Respiratory: Negative for shortness of breath.   Cardiovascular: Positive for leg swelling. Negative for chest pain.  Gastrointestinal: Negative for blood in stool.  Musculoskeletal: Positive for myalgias and arthralgias.  Skin: Positive for color change.  Neurological: Negative for numbness.  All other systems reviewed and are negative.  Allergies  Other  Home Medications   Prior to Admission medications   Medication Sig Start Date End Date Taking? Authorizing Provider  Ascorbic Acid (VITA-C) CRYS Take  1 tablet by mouth daily. 03/14/14   Jonelle SidleSamuel G McDowell, MD  aspirin 81 MG tablet Take 81 mg by mouth daily.    Historical Provider, MD  calcium-vitamin D (OSCAL WITH D) 500-200 MG-UNIT per tablet Take 2 tablets by mouth daily with breakfast.  03/14/14   Jonelle SidleSamuel G McDowell, MD  folic acid (FOLVITE) 1 MG tablet Take 1 mg by mouth daily.    Historical Provider, MD  gabapentin (NEURONTIN) 300 MG capsule Take 300 mg by mouth 2 (two) times daily.     Historical Provider, MD  Iron TABS Take 1 tablet by mouth daily. 03/14/14   Jonelle SidleSamuel G McDowell, MD  Omega-3 Fatty Acids (FISH OIL)  1200 MG CAPS Take by mouth daily.    Historical Provider, MD  predniSONE (DELTASONE) 5 MG tablet Take 20 mg by mouth daily with breakfast.  03/14/14   Jonelle SidleSamuel G McDowell, MD  simvastatin (ZOCOR) 10 MG tablet Take 10 mg by mouth daily.    Historical Provider, MD  sitaGLIPtin (JANUVIA) 25 MG tablet Take 1 tablet (25 mg total) by mouth daily. 05/05/16   Roma KayserGebreselassie W Nida, MD   BP 151/58 mmHg  Pulse 63  Temp(Src) 97.7 F (36.5 C) (Oral)  Resp 18  Ht 5\' 6"  (1.676 m)  Wt 163 lb (73.936 kg)  BMI 26.32 kg/m2  SpO2 98% Physical Exam CONSTITUTIONAL: Well developed/well nourished HEAD: Normocephalic/atraumatic EYES: EOMI ENMT: Mucous membranes moist NECK: supple no meningeal signs SPINE/BACK:entire spine nontender CV: S1/S2 noted LUNGS: Lungs are clear to auscultation bilaterally, no apparent distress ABDOMEN: soft, nontender NEURO: Pt is awake/alert/appropriate, moves all extremitiesx4.  No facial droop.   EXTREMITIES: pulses normal/equal, full ROM, significant pitting edema to RLE, no calf tenderness, no erythema, old bruising noted but no acute trauma.  Pulses noted in right foot SKIN: warm, color normal PSYCH: no abnormalities of mood noted, alert and oriented to situation ED Course  Procedures  DIAGNOSTIC STUDIES: Oxygen Saturation is 98% on RA, normal by my interpretation.    COORDINATION OF CARE: 11:10 AM Discussed next steps with pt. Pt verbalized understanding and is agreeable with the plan.   DVT study positive Pt well appearing She denies CP Denies recent bleeding issues Pharmacy consulted Will start xarelto PCP followup arranged for next week Discussed risk of bleeding and strict return precautions Will need stop ASA and NSAIDs Labs Review Labs Reviewed  BASIC METABOLIC PANEL - Abnormal; Notable for the following:    Glucose, Bld 208 (*)    BUN 50 (*)    Creatinine, Ser 1.35 (*)    GFR calc non Af Amer 35 (*)    GFR calc Af Amer 41 (*)    All other components  within normal limits  CBC WITH DIFFERENTIAL/PLATELET - Abnormal; Notable for the following:    WBC 13.0 (*)    Platelets 88 (*)    Neutro Abs 11.4 (*)    All other components within normal limits    Imaging Review [place] Venous Img Lower Unilateral Right  07/01/2016  CLINICAL DATA:  Right lower extremity pain and swelling EXAM: Right LOWER EXTREMITY VENOUS DOPPLER ULTRASOUND TECHNIQUE: Gray-scale sonography with graded compression, as well as color Doppler and duplex ultrasound were performed to evaluate the lower extremity deep venous systems from the level of the common femoral vein and including the common femoral, femoral, profunda femoral, popliteal and calf veins including the posterior tibial, peroneal and gastrocnemius veins when visible. The superficial great saphenous vein was also interrogated. Spectral Doppler was  utilized to evaluate flow at rest and with distal augmentation maneuvers in the common femoral, femoral and popliteal veins. COMPARISON:  10/30/2013 FINDINGS: Expanded, occlusive thrombus from the common femoral vein to the popliteal vein on the right, also visualized in the upper great saphenous and profunda femoris veins. Calf veins are patent. Extensive subcutaneous edema at the level of the calf. The left common femoral vein is patent with normal respiratory phasicity. IMPRESSION: Occlusive acute thrombus from the right popliteal to common femoral vein. Electronically Signed   By: Marnee SpringJonathon  Watts M.D.   On: 07/01/2016 12:54   I have personally reviewed and evaluated these lab results as part of my medical decision-making.  MDM   Final diagnoses:  DVT (deep venous thrombosis), right    Nursing notes including past medical history and social history reviewed and considered in documentation Labs/vital reviewed myself and considered during evaluation   I personally performed the services described in this documentation, which was scribed in my presence. The recorded  information has been reviewed and is accurate.       Zadie Rhineonald Nakima Fluegge, MD 07/01/16 [phone]

## 2016-07-01 NOTE — Progress Notes (Signed)
Educated pt re: Xarelto therapy.  Questions answered.  Pt was given discount Rx card for free 30 day Rx.    Valrie HartScott Franck Vinal, PharmD Clinical Pharmacist Pager:  [phone] 07/01/2016 1:35 PM

## 2016-07-01 NOTE — ED Notes (Signed)
[facility] to right lower extremity. Patient has significant swelling to right lower extremity at triage. States it started 1 week ago. Denies pain.

## 2016-07-01 NOTE — ED Notes (Signed)
Patient verbalizes understanding of discharge instructions, prescriptions, home care and follow up care. Patient out of department at this time. 

## 2016-07-30 ENCOUNTER — Other Ambulatory Visit: Payer: Self-pay | Admitting: "Endocrinology

## 2016-08-06 ENCOUNTER — Ambulatory Visit: Payer: Medicare Other | Admitting: "Endocrinology

## 2016-08-23 ENCOUNTER — Encounter (HOSPITAL_COMMUNITY): Payer: Self-pay

## 2016-08-23 ENCOUNTER — Emergency Department (HOSPITAL_COMMUNITY)
Admission: EM | Admit: 2016-08-23 | Discharge: 2016-08-24 | Disposition: A | Discharge status: Home / Self Care | Payer: Medicare Other | Attending: Emergency Medicine | Admitting: Emergency Medicine

## 2016-08-23 ENCOUNTER — Emergency Department (HOSPITAL_COMMUNITY): Payer: Medicare Other

## 2016-08-23 DIAGNOSIS — R609 Edema, unspecified: Secondary | ICD-10-CM

## 2016-08-23 DIAGNOSIS — Z7901 Long term (current) use of anticoagulants: Secondary | ICD-10-CM | POA: Diagnosis not present

## 2016-08-23 DIAGNOSIS — Y929 Unspecified place or not applicable: Secondary | ICD-10-CM | POA: Insufficient documentation

## 2016-08-23 DIAGNOSIS — I10 Essential (primary) hypertension: Secondary | ICD-10-CM | POA: Diagnosis not present

## 2016-08-23 DIAGNOSIS — Z86718 Personal history of other venous thrombosis and embolism: Secondary | ICD-10-CM | POA: Insufficient documentation

## 2016-08-23 DIAGNOSIS — E119 Type 2 diabetes mellitus without complications: Secondary | ICD-10-CM | POA: Insufficient documentation

## 2016-08-23 DIAGNOSIS — Y999 Unspecified external cause status: Secondary | ICD-10-CM | POA: Diagnosis not present

## 2016-08-23 DIAGNOSIS — Z7984 Long term (current) use of oral hypoglycemic drugs: Secondary | ICD-10-CM | POA: Diagnosis not present

## 2016-08-23 DIAGNOSIS — S40022A Contusion of left upper arm, initial encounter: Secondary | ICD-10-CM | POA: Diagnosis not present

## 2016-08-23 DIAGNOSIS — X500XXA Overexertion from strenuous movement or load, initial encounter: Secondary | ICD-10-CM | POA: Insufficient documentation

## 2016-08-23 DIAGNOSIS — M79622 Pain in left upper arm: Secondary | ICD-10-CM | POA: Diagnosis present

## 2016-08-23 DIAGNOSIS — Y9389 Activity, other specified: Secondary | ICD-10-CM | POA: Insufficient documentation

## 2016-08-23 DIAGNOSIS — T148XXA Other injury of unspecified body region, initial encounter: Secondary | ICD-10-CM

## 2016-08-23 DIAGNOSIS — M7989 Other specified soft tissue disorders: Secondary | ICD-10-CM

## 2016-08-23 LAB — CBC WITH DIFFERENTIAL/PLATELET
BASOS ABS: 0 10*3/uL (ref 0.0–0.1)
Basophils Relative: 0 %
Eosinophils Absolute: 0 10*3/uL (ref 0.0–0.7)
Eosinophils Relative: 0 %
HEMATOCRIT: 39.8 % (ref 36.0–46.0)
Hemoglobin: 12.9 g/dL (ref 12.0–15.0)
LYMPHS PCT: 11 %
Lymphs Abs: 1.5 10*3/uL (ref 0.7–4.0)
MCH: 30.4 pg (ref 26.0–34.0)
MCHC: 32.4 g/dL (ref 30.0–36.0)
MCV: 93.9 fL (ref 78.0–100.0)
Monocytes Absolute: 1.1 10*3/uL — ABNORMAL HIGH (ref 0.1–1.0)
Monocytes Relative: 8 %
NEUTROS ABS: 10.5 10*3/uL — AB (ref 1.7–7.7)
Neutrophils Relative %: 81 %
PLATELETS: 148 10*3/uL — AB (ref 150–400)
RBC: 4.24 MIL/uL (ref 3.87–5.11)
RDW: 12.9 % (ref 11.5–15.5)
WBC: 13.1 10*3/uL — AB (ref 4.0–10.5)

## 2016-08-23 LAB — BASIC METABOLIC PANEL
ANION GAP: 9 (ref 5–15)
BUN: 43 mg/dL — ABNORMAL HIGH (ref 6–20)
CALCIUM: 9.2 mg/dL (ref 8.9–10.3)
CO2: 22 mmol/L (ref 22–32)
Chloride: 108 mmol/L (ref 101–111)
Creatinine, Ser: 1.33 mg/dL — ABNORMAL HIGH (ref 0.44–1.00)
GFR calc Af Amer: 42 mL/min — ABNORMAL LOW (ref >60)
GFR calc non Af Amer: 36 mL/min — ABNORMAL LOW (ref >60)
GLUCOSE: 247 mg/dL — AB (ref 65–99)
POTASSIUM: 3.9 mmol/L (ref 3.5–5.1)
Sodium: 139 mmol/L (ref 135–145)

## 2016-08-23 NOTE — ED Notes (Addendum)
Pt reports that she is on Xaralto, went to pick up a bucket of water to water her plants and has had swelling and bruising to her left upper arm since then. She has sensation to her fingers and forearm and moves the arm albeit with pain. The is a large reddened and swollen area in her bicep region.    Ice applied to her upper arm to relieve pain, son at bedside

## 2016-08-23 NOTE — ED Notes (Signed)
Dr Barbaraann RondoMcMannas in to assess

## 2016-08-23 NOTE — ED Triage Notes (Signed)
Patient states she is on blood thinner for history of DVT

## 2016-08-23 NOTE — ED Triage Notes (Signed)
Patient states she was lifting a gallon of water today at noon, and immediately felt pain and had swelling to left upper arm. Patient presents with bruising and swelling to left arm patient has limited ROM +pulses to affected extremity with a weak grip

## 2016-08-23 NOTE — ED Notes (Signed)
Report to [facility] who have transported pt towards [place] [facility]

## 2016-08-23 NOTE — ED Provider Notes (Signed)
[facility]-EMERGENCY DEPT Provider Note   CSN: [id number] Arrival date & time: 08/23/16  1904     History   Chief Complaint Chief Complaint  Patient presents with  . Arm Pain    HPI Linda Hess is a 82 y.o. female.  HPI  Pt was seen at 2050.  Per pt, c/o sudden onset and worsening of constant left biceps area "pain" that began while lifting a gallon of water today approximately 1300. Pt states the area immediately began to "swell up" and "bruise." Pt states she has been taking xarelto due to hx DVT. Denies direct injury, no focal motor weakness, no tingling/numbness in extremities, no fever, no open wounds.    Past Medical History:  Diagnosis Date  . CIDP (chronic inflammatory demyelinating polyneuropathy) (HCC)   . Essential hypertension, benign   . History of DVT (deep vein thrombosis)    Reportedly left leg, details not clear  . Mixed hyperlipidemia   . Type 2 diabetes mellitus [facility])     Patient Active Problem List   Diagnosis Date Noted  . Peripheral arterial disease (HCC) 03/14/2014  . CIDP (chronic inflammatory demyelinating polyneuropathy) (HCC) 03/14/2014  . Mixed hyperlipidemia 03/12/2014  . Essential hypertension, benign 03/12/2014  . Uncontrolled type 2 diabetes mellitus with stage 3 chronic kidney disease (HCC) 03/12/2014    Past Surgical History:  Procedure Laterality Date  . CHOLECYSTECTOMY    . Left quadriceps muscle biopsy  March 2010  . Right knee replacement  September 2014     Home Medications    Prior to Admission medications   Medication Sig Start Date End Date Taking? Authorizing Provider  Ascorbic Acid (VITA-C) CRYS Take 1 tablet by mouth daily. 03/14/14  Yes Jonelle Sidle, MD  calcium-vitamin D (OSCAL WITH D) 500-200 MG-UNIT per tablet Take 2 tablets by mouth daily with breakfast.  03/14/14  Yes Jonelle Sidle, MD  folic acid (FOLVITE) 1 MG tablet Take 1 mg by mouth daily.   Yes Historical Provider, MD  Iron TABS Take 65 mg by mouth  daily.  03/14/14  Yes Jonelle Sidle, MD  JANUVIA 25 MG tablet TAKE (1) TABLET BY MOUTH DAILY FOR DIABETES 07/30/16  Yes Roma Kayser, MD  Omega-3 Fatty Acids (FISH OIL) 1200 MG CAPS Take by mouth daily.   Yes Historical Provider, MD  predniSONE (DELTASONE) 20 MG tablet Take 20 mg by mouth daily with breakfast.   Yes Historical Provider, MD  rivaroxaban (XARELTO) 20 MG TABS tablet Take 20 mg by mouth every morning.   Yes Historical Provider, MD  simvastatin (ZOCOR) 10 MG tablet Take 10 mg by mouth daily.   Yes Historical Provider, MD  traMADol (ULTRAM) 50 MG tablet Take 50 mg by mouth every 6 (six) hours as needed.   Yes Historical Provider, MD  Rivaroxaban 15 & 20 MG TBPK Take 1 tablet by mouth daily. Take as directed on package: Start with one 15mg  tablet by mouth twice a day with food. On Day 22, switch to one 20mg  tablet once a day with food. Patient not taking: Reported on 08/23/2016 07/01/16   Zadie Rhine, MD    Family History Family History  Problem Relation Age of Onset  . Heart attack Father   . Stroke Mother   . Cancer Brother     Social History Social History  Substance Use Topics  . Smoking status: Never Smoker  . Smokeless tobacco: Never Used  . Alcohol use No     Allergies  Other   Review of Systems Review of Systems ROS: Statement: All systems negative except as marked or noted in the HPI; Constitutional: Negative for fever and chills. ; ; Eyes: Negative for eye pain, redness and discharge. ; ; ENMT: Negative for ear pain, hoarseness, nasal congestion, sinus pressure and sore throat. ; ; Cardiovascular: Negative for chest pain, palpitations, diaphoresis, dyspnea and peripheral edema. ; ; Respiratory: Negative for cough, wheezing and stridor. ; ; Gastrointestinal: Negative for nausea, vomiting, diarrhea, abdominal pain, blood in stool, hematemesis, jaundice and rectal bleeding. . ; ; Genitourinary: Negative for dysuria, flank pain and hematuria. ; ;  Musculoskeletal: +LUE bruising, pain. Negative for back pain and neck pain. Negative for direct trauma.; ; Skin: Negative for pruritus, rash, abrasions, blisters, and skin lesion.; ; Neuro: Negative for headache, lightheadedness and neck stiffness. Negative for weakness, altered level of consciousness, altered mental status, extremity weakness, paresthesias, involuntary movement, seizure and syncope.       Physical Exam Updated Vital Signs BP 170/84 (BP Location: Right Arm)   Pulse 79   Temp 97.7 F (36.5 C) (Oral)   Resp 18   Ht 5\' 6"  (1.676 m)   Wt 160 lb (72.6 kg)   SpO2 97%   BMI 25.82 kg/m   Physical Exam 2055: Physical examination:  Nursing notes reviewed; Vital signs and O2 SAT reviewed;  Constitutional: Well developed, Well nourished, Well hydrated, In no acute distress; Head:  Normocephalic, atraumatic; Eyes: EOMI, PERRL, No scleral icterus; ENMT: Mouth and pharynx normal, Mucous membranes moist; Neck: Supple, Full range of motion, No lymphadenopathy; Cardiovascular: Regular rate and rhythm, No gallop; Respiratory: Breath sounds clear & equal bilaterally, No wheezes.  Speaking full sentences with ease, Normal respiratory effort/excursion; Chest: Nontender, Movement normal; Abdomen: Soft, Nontender, Nondistended, Normal bowel sounds; Genitourinary: No CVA tenderness; Extremities: Pulses normal, No deformity. +large area of hematoma to left biceps area extending to medial AC and proximal forearm areas, +TTP left biceps and forearm, +decreased ROM left elbow due to pain. Strong radial pulse. NMS intact left hand/fingers. ; Neuro: AA&Ox3, Major CN grossly intact.  Speech clear. No gross focal motor or sensory deficits in extremities.; Skin: Color normal, Warm, Dry.   ED Treatments / Results  Labs (all labs ordered are listed, but only abnormal results are displayed)   EKG  EKG Interpretation None       Radiology   Procedures Procedures (including critical care  time)  Medications Ordered in ED Medications - No data to display   Initial Impression / Assessment and Plan / ED Course  I have reviewed the triage vital signs and the nursing notes.  Pertinent labs & imaging results that were available during my care of the patient were reviewed by me and considered in my medical decision making (see chart for details).  MDM Reviewed: previous chart, nursing note and vitals Reviewed previous: labs Interpretation: labs    Results for orders placed or performed during the hospital encounter of 08/23/16  CBC with Differential  Result Value Ref Range   WBC 13.1 (H) 4.0 - 10.5 K/uL   RBC 4.24 3.87 - 5.11 MIL/uL   Hemoglobin 12.9 12.0 - 15.0 g/dL   HCT 95.2 84.1 - 32.4 %   MCV 93.9 78.0 - 100.0 fL   MCH 30.4 26.0 - 34.0 pg   MCHC 32.4 30.0 - 36.0 g/dL   RDW 40.1 02.7 - 25.3 %   Platelets 148 (L) 150 - 400 K/uL   Neutrophils Relative % 81 %  Neutro Abs 10.5 (H) 1.7 - 7.7 K/uL   Lymphocytes Relative 11 %   Lymphs Abs 1.5 0.7 - 4.0 K/uL   Monocytes Relative 8 %   Monocytes Absolute 1.1 (H) 0.1 - 1.0 K/uL   Eosinophils Relative 0 %   Eosinophils Absolute 0.0 0.0 - 0.7 K/uL   Basophils Relative 0 %   Basophils Absolute 0.0 0.0 - 0.1 K/uL  Basic metabolic panel  Result Value Ref Range   Sodium 139 135 - 145 mmol/L   Potassium 3.9 3.5 - 5.1 mmol/L   Chloride 108 101 - 111 mmol/L   CO2 22 22 - 32 mmol/L   Glucose, Bld 247 (H) 65 - 99 mg/dL   BUN 43 (H) 6 - 20 mg/dL   Creatinine, Ser 1.611.33 (H) 0.44 - 1.00 mg/dL   Calcium 9.2 8.9 - 09.610.3 mg/dL   GFR calc non Af Amer 36 (L) >60 mL/min   GFR calc Af Amer 42 (L) >60 mL/min   Anion gap 9 5 - 15    2305:  VS remain stable; no change in assessment. Unable to perform CT scan with IV contrast d/t low GFR. Pt will need MRI LUE. [facility] with [facility]po based [facility] results. [facility] and EDP Dr. Corlis LeakMacKuen given report.     Final Clinical Impressions(s) / ED Diagnoses    Final diagnoses:  Swelling  Bruising  Hematoma of arm, left, initial encounter    New Prescriptions New Prescriptions   No medications on file     Samuel JesterKathleen Creighton Longley, DO 08/27/16 2210

## 2016-08-23 NOTE — ED Notes (Signed)
Call to CT re time for CT- informed that an IV would be required- IV established Labs drawn

## 2016-08-24 ENCOUNTER — Emergency Department (HOSPITAL_COMMUNITY): Payer: Medicare Other

## 2016-08-24 NOTE — ED Notes (Signed)
Upper arm 13in Lower arm 12in

## 2016-08-24 NOTE — ED Notes (Signed)
Arm above elbow Circumference 13.5in  Arm Below Elbow 10.5in

## 2016-08-24 NOTE — Discharge Instructions (Signed)
You have a hematoma (bleeding) in your biceps muscle of your left arm.  Wear the compressive bandage and decrease the use of your arm.  You can apply ice to your arm.  Get rechecked immediately if you develop fevers, increased pain, numbness, or new/worrisome symptoms.

## 2016-08-24 NOTE — ED Notes (Signed)
Patient transported to MRI 

## 2016-08-24 NOTE — ED Provider Notes (Signed)
Pt here from [place] [facility] Discussed with Dr. Linna CapriceSwinteck with Orthopedics who recommends x-ray prior to additional imaging. X-rays negative for acute fracture. MRI demonstrates biceps hematoma. Patient feels improved on examination. On repeat assessment in ED her upper arm has similar to slightly decreased swelling, her forearm has increased swelling. She is well perfused with no evidence of compartment syndrome. Plan to place a compressive bandage to her arm, sling for comfort, outpatient follow-up, return precautions. Discussed close return precautions for compartment syndrome.   Tilden FossaElizabeth Aimi Essner, MD 08/24/16 [phone]

## 2017-05-04 ENCOUNTER — Encounter (HOSPITAL_COMMUNITY): Payer: Self-pay

## 2017-05-04 ENCOUNTER — Emergency Department (HOSPITAL_COMMUNITY): Payer: Medicare Other

## 2017-05-04 ENCOUNTER — Emergency Department (HOSPITAL_COMMUNITY)
Admission: EM | Admit: 2017-05-04 | Discharge: 2017-05-04 | Disposition: A | Discharge status: Home / Self Care | Payer: Medicare Other | Attending: Emergency Medicine | Admitting: Emergency Medicine

## 2017-05-04 DIAGNOSIS — Z79899 Other long term (current) drug therapy: Secondary | ICD-10-CM | POA: Diagnosis not present

## 2017-05-04 DIAGNOSIS — N183 Chronic kidney disease, stage 3 (moderate): Secondary | ICD-10-CM | POA: Diagnosis not present

## 2017-05-04 DIAGNOSIS — I129 Hypertensive chronic kidney disease with stage 1 through stage 4 chronic kidney disease, or unspecified chronic kidney disease: Secondary | ICD-10-CM | POA: Insufficient documentation

## 2017-05-04 DIAGNOSIS — W19XXXA Unspecified fall, initial encounter: Secondary | ICD-10-CM

## 2017-05-04 DIAGNOSIS — E1122 Type 2 diabetes mellitus with diabetic chronic kidney disease: Secondary | ICD-10-CM | POA: Insufficient documentation

## 2017-05-04 DIAGNOSIS — Z7984 Long term (current) use of oral hypoglycemic drugs: Secondary | ICD-10-CM | POA: Insufficient documentation

## 2017-05-04 DIAGNOSIS — Y929 Unspecified place or not applicable: Secondary | ICD-10-CM | POA: Insufficient documentation

## 2017-05-04 DIAGNOSIS — Y9389 Activity, other specified: Secondary | ICD-10-CM | POA: Diagnosis not present

## 2017-05-04 DIAGNOSIS — W010XXA Fall on same level from slipping, tripping and stumbling without subsequent striking against object, initial encounter: Secondary | ICD-10-CM | POA: Insufficient documentation

## 2017-05-04 DIAGNOSIS — S79911A Unspecified injury of right hip, initial encounter: Secondary | ICD-10-CM | POA: Diagnosis present

## 2017-05-04 DIAGNOSIS — S7001XA Contusion of right hip, initial encounter: Secondary | ICD-10-CM | POA: Diagnosis not present

## 2017-05-04 DIAGNOSIS — Z7901 Long term (current) use of anticoagulants: Secondary | ICD-10-CM | POA: Diagnosis not present

## 2017-05-04 DIAGNOSIS — Y999 Unspecified external cause status: Secondary | ICD-10-CM | POA: Insufficient documentation

## 2017-05-04 LAB — CBC WITH DIFFERENTIAL/PLATELET
BASOS ABS: 0 10*3/uL (ref 0.0–0.1)
Basophils Relative: 0 %
EOS PCT: 0 %
Eosinophils Absolute: 0 10*3/uL (ref 0.0–0.7)
HCT: 37 % (ref 36.0–46.0)
HEMOGLOBIN: 12 g/dL (ref 12.0–15.0)
LYMPHS ABS: 0.9 10*3/uL (ref 0.7–4.0)
Lymphocytes Relative: 6 %
MCH: 30 pg (ref 26.0–34.0)
MCHC: 32.4 g/dL (ref 30.0–36.0)
MCV: 92.5 fL (ref 78.0–100.0)
Monocytes Absolute: 1 10*3/uL (ref 0.1–1.0)
Monocytes Relative: 7 %
NEUTROS ABS: 12.5 10*3/uL — AB (ref 1.7–7.7)
NEUTROS PCT: 87 %
PLATELETS: 150 10*3/uL (ref 150–400)
RBC: 4 MIL/uL (ref 3.87–5.11)
RDW: 13.1 % (ref 11.5–15.5)
WBC: 14.4 10*3/uL — AB (ref 4.0–10.5)

## 2017-05-04 LAB — COMPREHENSIVE METABOLIC PANEL
ALK PHOS: 39 U/L (ref 38–126)
ALT: 17 U/L (ref 14–54)
AST: 22 U/L (ref 15–41)
Albumin: 3.4 g/dL — ABNORMAL LOW (ref 3.5–5.0)
Anion gap: 11 (ref 5–15)
BUN: 39 mg/dL — AB (ref 6–20)
CALCIUM: 9.8 mg/dL (ref 8.9–10.3)
CHLORIDE: 105 mmol/L (ref 101–111)
CO2: 24 mmol/L (ref 22–32)
CREATININE: 1.37 mg/dL — AB (ref 0.44–1.00)
GFR calc Af Amer: 40 mL/min — ABNORMAL LOW (ref >60)
GFR calc non Af Amer: 35 mL/min — ABNORMAL LOW (ref >60)
GLUCOSE: 272 mg/dL — AB (ref 65–99)
Potassium: 4.8 mmol/L (ref 3.5–5.1)
Sodium: 140 mmol/L (ref 135–145)
Total Bilirubin: 0.8 mg/dL (ref 0.3–1.2)
Total Protein: 5.8 g/dL — ABNORMAL LOW (ref 6.5–8.1)

## 2017-05-04 MED ORDER — HYDROMORPHONE HCL 1 MG/ML IJ SOLN: 0.5000 mg | Freq: Once | INTRAMUSCULAR | Status: DC

## 2017-05-04 MED ORDER — HYDROCODONE-ACETAMINOPHEN 5-325 MG PO TABS: 1.0000 | ORAL_TABLET | Freq: Four times a day (QID) | ORAL | 0 refills | Status: DC | PRN

## 2017-05-04 MED ORDER — ONDANSETRON HCL 4 MG/2ML IJ SOLN: 4.0000 mg | Freq: Once | INTRAMUSCULAR | Status: DC

## 2017-05-04 MED ORDER — ONDANSETRON HCL 4 MG/2ML IJ SOLN
4.0000 mg | Freq: Once | INTRAMUSCULAR | Status: AC
  Administered 2017-05-04: 4 mg via INTRAVENOUS
  Filled 2017-05-04: qty 2

## 2017-05-04 MED ORDER — HYDROMORPHONE HCL 1 MG/ML IJ SOLN
0.5000 mg | Freq: Once | INTRAMUSCULAR | Status: AC
  Administered 2017-05-04: 0.5 mg via INTRAVENOUS
  Filled 2017-05-04: qty 1

## 2017-05-04 NOTE — ED Triage Notes (Signed)
Pt reports she got up at 0430 to go to the bathroom today and tripped and fell on hard wood floors.  Pt c/o pain to r hip.  Pt also has laceration to left calf.  Pt thinks may have hit leg on the cabinet.  Pt has dark discoloration to both legs but says is not new.  r leg appears slightly shorter than left.  Will assess further when pt undressed.

## 2017-05-04 NOTE — ED Notes (Signed)
Pt wheeled to sons car. Pt verbalized understanding of discharge instructions.   

## 2017-05-04 NOTE — ED Provider Notes (Signed)
[facility] DEPT Provider Note   CSN: [id number] Arrival date & time: 05/04/17  1233  By signing my name below, I, Doreatha Martin, attest that this documentation has been prepared under the direction and in the presence of Bethann Berkshire, MD. Electronically Signed: Doreatha Martin, ED Scribe. 05/04/17. 1:12 PM.     History   Chief Complaint Chief Complaint  Patient presents with  . Fall    HPI Linda Hess is a 83 y.o. female who presents to the Emergency Department complaining of moderate, constant right hip pain s/p fall that occurred at 4:30 am. Per pt, she got up to go to the restroom, tripped and fell onto hardwood floors. She denies LOC. Per pt, she was able to get up on her own after the fall and proceeded to call her son at 6 am. She has not ambulated since falling. She reports her hip pain is worsened with movement. Pt additionally complains of a laceration to the right calf. Pt states she lives alone. Pt is currently taking blood thinners. She denies additional injuries or complaints.   The history is provided by the patient. No language interpreter was used.  Fall  This is a new problem. The current episode started 6 to 12 hours ago. Episode frequency: once. The problem has not changed since onset.Pertinent negatives include no chest pain, no abdominal pain and no headaches. The symptoms are aggravated by bending. Nothing relieves the symptoms. She has tried nothing for the symptoms. The treatment provided no relief.    Past Medical History:  Diagnosis Date  . CIDP (chronic inflammatory demyelinating polyneuropathy) (HCC)   . Essential hypertension, benign    pt denies  . History of DVT (deep vein thrombosis)    Reportedly left leg, details not clear  . Mixed hyperlipidemia   . Type 2 diabetes mellitus [facility])     Patient Active Problem List   Diagnosis Date Noted  . Peripheral arterial disease (HCC) 03/14/2014  . CIDP (chronic inflammatory demyelinating polyneuropathy)  (HCC) 03/14/2014  . Mixed hyperlipidemia 03/12/2014  . Essential hypertension, benign 03/12/2014  . Uncontrolled type 2 diabetes mellitus with stage 3 chronic kidney disease (HCC) 03/12/2014    Past Surgical History:  Procedure Laterality Date  . CHOLECYSTECTOMY    . Left quadriceps muscle biopsy  March 2010  . Right knee replacement  September 2014    OB History    No data available       Home Medications    Prior to Admission medications   Medication Sig Start Date End Date Taking? Authorizing Provider  Ascorbic Acid (VITA-C) CRYS Take 1 tablet by mouth daily. 03/14/14   Jonelle Sidle, MD  calcium-vitamin D [place] Rummage WITH D) 500-200 MG-UNIT per tablet Take 2 tablets by mouth daily with breakfast.  03/14/14   Jonelle Sidle, MD  folic acid (FOLVITE) 1 MG tablet Take 1 mg by mouth daily.    [provider]  Iron TABS Take 65 mg by mouth daily.  03/14/14   Jonelle Sidle, MD  JANUVIA 25 MG tablet TAKE (1) TABLET BY MOUTH DAILY FOR DIABETES 07/30/16   Roma Kayser, MD  Omega-3 Fatty Acids (FISH OIL) 1200 MG CAPS Take by mouth daily.    [provider]  predniSONE (DELTASONE) 20 MG tablet Take 20 mg by mouth daily with breakfast.    [provider]  rivaroxaban (XARELTO) 20 MG TABS tablet Take 20 mg by mouth every morning.    [provider]  Rivaroxaban 15 & 20 MG TBPK Take 1 tablet by mouth daily. Take as directed on package: Start with one 15mg  tablet by mouth twice a day with food. On Day 22, switch to one 20mg  tablet once a day with food. Patient not taking: Reported on 08/23/2016 07/01/16   Zadie RhineWickline, Donald, MD  simvastatin (ZOCOR) 10 MG tablet Take 10 mg by mouth daily.    [provider]  traMADol (ULTRAM) 50 MG tablet Take 50 mg by mouth every 6 (six) hours as needed.    [provider]    Family History Family History  Problem Relation Age of Onset  . Heart attack Father   . Stroke Mother   . Cancer  Brother     Social History Social History  Substance Use Topics  . Smoking status: Never Smoker  . Smokeless tobacco: Never Used  . Alcohol use No     Allergies   Other   Review of Systems Review of Systems  Constitutional: Negative for appetite change and fatigue.  HENT: Negative for congestion, ear discharge and sinus pressure.   Eyes: Negative for discharge.  Respiratory: Negative for cough.   Cardiovascular: Negative for chest pain.  Gastrointestinal: Negative for abdominal pain and diarrhea.  Genitourinary: Negative for frequency and hematuria.  Musculoskeletal: Positive for arthralgias. Negative for back pain.  Skin: Positive for wound. Negative for rash.  Neurological: Negative for seizures, syncope and headaches.  Psychiatric/Behavioral: Negative for hallucinations.     Physical Exam Updated Vital Signs BP 109/65 (BP Location: Left Arm)   Pulse 73   Temp 97.9 F (36.6 C) (Oral)   Resp 18   Ht 5\' 6"  (1.676 m)   Wt 155 lb (70.3 kg)   SpO2 100%   BMI 25.02 kg/m   Physical Exam  Constitutional: She is oriented to person, place, and time. She appears well-developed.  HENT:  Head: Normocephalic.  Eyes: Conjunctivae and EOM are normal. No scleral icterus.  Neck: Neck supple. No thyromegaly present.  Cardiovascular: Normal rate and regular rhythm.  Exam reveals no gallop and no friction rub.   No murmur heard. Pulmonary/Chest: No stridor. She has no wheezes. She has no rales. She exhibits no tenderness.  Abdominal: She exhibits no distension. There is no tenderness. There is no rebound.  Musculoskeletal: Normal range of motion. She exhibits tenderness. She exhibits no edema.  Significant bruising to the right hip with tenderness. Dusky color to both legs.   Lymphadenopathy:    She has no cervical adenopathy.  Neurological: She is oriented to person, place, and time. She exhibits normal muscle tone. Coordination normal.  Skin: No rash noted. No erythema.    Psychiatric: She has a normal mood and affect. Her behavior is normal.  Nursing note and vitals reviewed.    ED Treatments / Results   DIAGNOSTIC STUDIES: Oxygen Saturation is 100% on RA, normal by my interpretation.    COORDINATION OF CARE: 1:10 PM Discussed treatment plan with pt at bedside which includes XR and pt agreed to plan.    Labs (all labs ordered are listed, but only abnormal results are displayed) Labs Reviewed - No data to display  EKG  EKG Interpretation None       Radiology No results found.  Procedures Procedures (including critical care time)  Medications Ordered in ED Medications - No data to display   Initial Impression / Assessment and Plan / ED Course  I have reviewed the triage vital signs and the nursing notes.  Pertinent labs & imaging results that were available during my care of the patient were reviewed by me and considered in my medical decision making (see chart for details).     CT scan shows large contusion but no fracture to her right hip. She has been referred to orthopedics and will use a walker to ambulate and is given some pain medicine  Final Clinical Impressions(s) / ED Diagnoses   Final diagnoses:  None    New Prescriptions New Prescriptions   No medications on file   The chart was scribed for me under my direct supervision.  I personally performed the history, physical, and medical decision making and all procedures in the evaluation of this patient.Bethann Berkshire, MD 05/04/17 Ebony Cargo

## 2017-05-04 NOTE — Discharge Instructions (Signed)
Follow-up with Dr. Harrison next week for recheck 

## 2017-05-04 NOTE — ED Notes (Signed)
Pt has swelling and bruise to r hip.  Bruising also noted to both knees.    Bilateral pedal pulses auscultated with doppler.

## 2018-06-10 ENCOUNTER — Other Ambulatory Visit: Payer: Self-pay | Admitting: Sports Medicine

## 2018-06-10 DIAGNOSIS — M4722 Other spondylosis with radiculopathy, cervical region: Secondary | ICD-10-CM

## 2018-06-10 DIAGNOSIS — M542 Cervicalgia: Secondary | ICD-10-CM

## 2018-06-23 ENCOUNTER — Ambulatory Visit
Admission: RE | Admit: 2018-06-23 | Discharge: 2018-06-23 | Disposition: A | Discharge status: Home / Self Care | Payer: Medicare Other | Source: Ambulatory Visit | Attending: Sports Medicine | Admitting: Sports Medicine

## 2018-06-23 ENCOUNTER — Ambulatory Visit: Payer: Medicare Other

## 2018-06-23 DIAGNOSIS — M542 Cervicalgia: Secondary | ICD-10-CM

## 2018-06-23 DIAGNOSIS — M4722 Other spondylosis with radiculopathy, cervical region: Secondary | ICD-10-CM | POA: Insufficient documentation

## 2018-06-23 DIAGNOSIS — M2578 Osteophyte, vertebrae: Secondary | ICD-10-CM | POA: Diagnosis not present

## 2018-06-23 DIAGNOSIS — M5030 Other cervical disc degeneration, unspecified cervical region: Secondary | ICD-10-CM | POA: Insufficient documentation

## 2018-06-23 DIAGNOSIS — M50322 Other cervical disc degeneration at C5-C6 level: Secondary | ICD-10-CM | POA: Insufficient documentation

## 2018-06-23 DIAGNOSIS — M4802 Spinal stenosis, cervical region: Secondary | ICD-10-CM | POA: Insufficient documentation

## 2018-07-04 ENCOUNTER — Ambulatory Visit: Payer: Medicare Other

## 2018-11-25 ENCOUNTER — Other Ambulatory Visit: Payer: Self-pay

## 2018-11-25 ENCOUNTER — Emergency Department (HOSPITAL_COMMUNITY)
Admission: EM | Admit: 2018-11-25 | Discharge: 2018-11-25 | Disposition: A | Discharge status: Home / Self Care | Payer: Medicare Other | Attending: Emergency Medicine | Admitting: Emergency Medicine

## 2018-11-25 ENCOUNTER — Encounter (HOSPITAL_COMMUNITY): Payer: Self-pay | Admitting: Emergency Medicine

## 2018-11-25 DIAGNOSIS — I10 Essential (primary) hypertension: Secondary | ICD-10-CM | POA: Diagnosis not present

## 2018-11-25 DIAGNOSIS — E119 Type 2 diabetes mellitus without complications: Secondary | ICD-10-CM | POA: Diagnosis not present

## 2018-11-25 DIAGNOSIS — L03113 Cellulitis of right upper limb: Secondary | ICD-10-CM | POA: Insufficient documentation

## 2018-11-25 DIAGNOSIS — R2231 Localized swelling, mass and lump, right upper limb: Secondary | ICD-10-CM | POA: Diagnosis present

## 2018-11-25 DIAGNOSIS — Z79899 Other long term (current) drug therapy: Secondary | ICD-10-CM | POA: Diagnosis not present

## 2018-11-25 LAB — CBC WITH DIFFERENTIAL/PLATELET
ABS IMMATURE GRANULOCYTES: 0.11 10*3/uL — AB (ref 0.00–0.07)
Basophils Absolute: 0 10*3/uL (ref 0.0–0.1)
Basophils Relative: 0 %
Eosinophils Absolute: 0.1 10*3/uL (ref 0.0–0.5)
Eosinophils Relative: 0 %
HEMATOCRIT: 38.6 % (ref 36.0–46.0)
HEMOGLOBIN: 12.2 g/dL (ref 12.0–15.0)
IMMATURE GRANULOCYTES: 1 %
LYMPHS ABS: 0.5 10*3/uL — AB (ref 0.7–4.0)
LYMPHS PCT: 4 %
MCH: 29.8 pg (ref 26.0–34.0)
MCHC: 31.6 g/dL (ref 30.0–36.0)
MCV: 94.1 fL (ref 80.0–100.0)
MONO ABS: 0.7 10*3/uL (ref 0.1–1.0)
MONOS PCT: 5 %
NEUTROS ABS: 12.5 10*3/uL — AB (ref 1.7–7.7)
NEUTROS PCT: 90 %
Platelets: 153 10*3/uL (ref 150–400)
RBC: 4.1 MIL/uL (ref 3.87–5.11)
RDW: 12.3 % (ref 11.5–15.5)
WBC: 13.9 10*3/uL — ABNORMAL HIGH (ref 4.0–10.5)
nRBC: 0 % (ref 0.0–0.2)

## 2018-11-25 LAB — BASIC METABOLIC PANEL
ANION GAP: 7 (ref 5–15)
BUN: 31 mg/dL — ABNORMAL HIGH (ref 8–23)
CHLORIDE: 103 mmol/L (ref 98–111)
CO2: 25 mmol/L (ref 22–32)
Calcium: 8.9 mg/dL (ref 8.9–10.3)
Creatinine, Ser: 1.16 mg/dL — ABNORMAL HIGH (ref 0.44–1.00)
GFR calc Af Amer: 50 mL/min — ABNORMAL LOW (ref >60)
GFR, EST NON AFRICAN AMERICAN: 43 mL/min — AB (ref >60)
GLUCOSE: 280 mg/dL — AB (ref 70–99)
POTASSIUM: 4.4 mmol/L (ref 3.5–5.1)
Sodium: 135 mmol/L (ref 135–145)

## 2018-11-25 MED ORDER — VANCOMYCIN HCL IN DEXTROSE 750-5 MG/150ML-% IV SOLN
750.0000 mg | INTRAVENOUS | Status: DC
  Filled 2018-11-25 (×3): qty 150

## 2018-11-25 MED ORDER — SODIUM CHLORIDE 0.9 % IV BOLUS
500.0000 mL | Freq: Once | INTRAVENOUS | Status: AC
  Administered 2018-11-25: 500 mL via INTRAVENOUS

## 2018-11-25 MED ORDER — VANCOMYCIN HCL 10 G IV SOLR
1500.0000 mg | Freq: Once | INTRAVENOUS | Status: AC
  Administered 2018-11-25: 1500 mg via INTRAVENOUS
  Filled 2018-11-25: qty 1500

## 2018-11-25 MED ORDER — DOXYCYCLINE HYCLATE 100 MG PO CAPS: 100.0000 mg | ORAL_CAPSULE | Freq: Two times a day (BID) | ORAL | 0 refills | Status: DC

## 2018-11-25 NOTE — Progress Notes (Signed)
Pharmacy Antibiotic Note  Linda Hess is a 84 y.o. female admitted on 11/25/2018 with cellulitis.  Pharmacy has been consulted for Vancomycin dosing.  Plan: Vancomycin 1500 mg IV x 1 dose. Vancomycin 750 mg IV every 24 hours.  Goal trough 10-15 mcg/mL.  Monitor labs, c/s, and vanco trough as indicated.  Height: 5\' 6"  (167.6 cm) Weight: 160 lb (72.6 kg) IBW/kg (Calculated) : 59.3  Temp (24hrs), Avg:98.6 F (37 C), Min:98.6 F (37 C), Max:98.6 F (37 C)  Recent Labs  Lab 11/25/18 1057  WBC 13.9*  CREATININE 1.16*    Estimated Creatinine Clearance: 36.8 mL/min (A) (by C-G formula based on SCr of 1.16 mg/dL (H)).    Allergies  Allergen Reactions  . Amoxicillin-Pot Clavulanate Hives  . Other Hives and Itching    RED MEAT  . Penicillins     hives    Antimicrobials this admission: Vanco 11/29 >>      Dose adjustments this admission: Vanco  Microbiology results: None pending  Thank you for allowing pharmacy to be a part of this patient's care.  Tad MooreSteven C Etheleen Valtierra 11/25/2018 11:50 AM

## 2018-11-25 NOTE — Discharge Instructions (Addendum)
Prescription for antibiotic.  Start at dinner time.  Keep your arm clean with soap and water.  Symptoms may worsen.  If this happens, you will need to return to the emergency department for a potential admission.

## 2018-11-25 NOTE — ED Notes (Signed)
Schedule to see PCP on Thursday.

## 2018-11-25 NOTE — ED Triage Notes (Signed)
Pt reports right arm pain,swelling since November. Pt reports a "lady fell beside me and pulled on my arm as she went down." pt reports scratch to arm and increased swelling/redness/bruising ever since. Pt reports is on blood thinner. Pt reports increased pain with movement and to touch.delayed cap refill. Moderate radial pulse palpated.

## 2018-11-28 NOTE — ED Provider Notes (Signed)
[facility] EMERGENCY DEPARTMENT Provider Note   CSN: [id number] Arrival date & time: 11/25/18  4098     History   Chief Complaint Chief Complaint  Patient presents with  . Arm Pain    HPI Linda JOLLIFF is a 84 y.o. female.  Right arm swelling and redness for several weeks.  Symptoms initially started with a scratch in her forearm.  No fever, sweats, chills.  No other obvious injuries or concerns.  No prior treatment.  Palpation makes pain worse.  Severity is moderate.     Past Medical History:  Diagnosis Date  . CIDP (chronic inflammatory demyelinating polyneuropathy) (HCC)   . Essential hypertension, benign    pt denies  . History of DVT (deep vein thrombosis)    Reportedly left leg, details not clear  . Mixed hyperlipidemia   . Type 2 diabetes mellitus [facility])     Patient Active Problem List   Diagnosis Date Noted  . Peripheral arterial disease (HCC) 03/14/2014  . CIDP (chronic inflammatory demyelinating polyneuropathy) (HCC) 03/14/2014  . Mixed hyperlipidemia 03/12/2014  . Essential hypertension, benign 03/12/2014  . Uncontrolled type 2 diabetes mellitus with stage 3 chronic kidney disease (HCC) 03/12/2014    Past Surgical History:  Procedure Laterality Date  . CHOLECYSTECTOMY    . Left quadriceps muscle biopsy  March 2010  . Right knee replacement  September 2014     OB History   None      Home Medications    Prior to Admission medications   Medication Sig Start Date End Date Taking? Authorizing Provider  acetaminophen (TYLENOL) 325 MG tablet Take 325 mg by mouth every 6 (six) hours as needed for mild pain.   Yes [provider]  Ascorbic Acid (VITA-C) CRYS Take 1 tablet by mouth daily. 03/14/14  Yes Jonelle Sidle, MD  calcium-vitamin D (OSCAL WITH D) 500-200 MG-UNIT per tablet Take 2 tablets by mouth daily with breakfast.  03/14/14  Yes Jonelle Sidle, MD  ferrous sulfate 325 (65 FE) MG tablet Take by mouth. 03/14/14  Yes  [provider]  folic acid (FOLVITE) 1 MG tablet Take 1 mg by mouth daily.   Yes [provider]  gabapentin (NEURONTIN) 100 MG capsule 1 capsule p.o. 3 times daily as needed 08/30/18  Yes [provider]  JANUVIA 25 MG tablet TAKE (1) TABLET BY MOUTH DAILY FOR DIABETES 07/30/16  Yes Nida, Denman George, MD  Omega-3 Fatty Acids (FISH OIL) 1200 MG CAPS Take by mouth daily.   Yes [provider]  predniSONE (DELTASONE) 10 MG tablet Take 15 mg by mouth daily with breakfast.    Yes [provider]  rivaroxaban (XARELTO) 20 MG TABS tablet Take 20 mg by mouth every morning.   Yes [provider]  simvastatin (ZOCOR) 10 MG tablet Take 10 mg by mouth daily.   Yes [provider]  traMADol (ULTRAM) 50 MG tablet Take 50 mg by mouth every 6 (six) hours as needed for moderate pain.    Yes [provider]  doxycycline (VIBRAMYCIN) 100 MG capsule Take 1 capsule (100 mg total) by mouth 2 (two) times daily. 11/25/18   Linda Hutching, MD    Family History Family History  Problem Relation Age of Onset  . Heart attack Father   . Stroke Mother   . Cancer Brother     Social History Social History   Tobacco Use  . Smoking status: Never Smoker  . Smokeless tobacco: Never Used  Substance Use Topics  . Alcohol use: No  . Drug use: No     Allergies   Amoxicillin-pot clavulanate; Other; and Penicillins   Review of Systems Review of Systems  All other systems reviewed and are negative.    Physical Exam Updated Vital Signs BP (!) 159/82 (BP Location: Left Arm)   Pulse 82   Temp 98.1 F (36.7 C) (Oral)   Resp 14   Ht 5\' 6"  (1.676 m)   Wt 72.6 kg   SpO2 100%   BMI 25.82 kg/m   Physical Exam  Constitutional: She is oriented to person, place, and time. She appears well-developed and well-nourished.  HENT:  Head: Normocephalic and atraumatic.  Eyes: Conjunctivae are normal.  Neck: Neck supple.  Cardiovascular: Normal rate  and regular rhythm.  Pulmonary/Chest: Effort normal and breath sounds normal.  Abdominal: Soft. Bowel sounds are normal.  Musculoskeletal: Normal range of motion.  Neurological: She is alert and oriented to person, place, and time.  Skin:  Right upper extremity: Erythema from the distal humerus to the hand with some edema.  Healing scab on mid forearm  Psychiatric: She has a normal mood and affect. Her behavior is normal.  Nursing note and vitals reviewed.    ED Treatments / Results  Labs (all labs ordered are listed, but only abnormal results are displayed) Labs Reviewed  CBC WITH DIFFERENTIAL/PLATELET - Abnormal; Notable for the following components:      Result Value   WBC 13.9 (*)    Neutro Abs 12.5 (*)    Lymphs Abs 0.5 (*)    Abs Immature Granulocytes 0.11 (*)    All other components within normal limits  BASIC METABOLIC PANEL - Abnormal; Notable for the following components:   Glucose, Bld 280 (*)    BUN 31 (*)    Creatinine, Ser 1.16 (*)    GFR calc non Af Amer 43 (*)    GFR calc Af Amer 50 (*)    All other components within normal limits    EKG None  Radiology No results found.  Procedures Procedures (including critical care time)  Medications Ordered in ED Medications  sodium chloride 0.9 % bolus 500 mL (0 mLs Intravenous Stopped 11/25/18 1232)  vancomycin (VANCOCIN) 1,500 mg in sodium chloride 0.9 % 500 mL IVPB (0 mg Intravenous Stopped 11/25/18 1436)     Initial Impression / Assessment and Plan / ED Course  I have reviewed the triage vital signs and the nursing notes.  Pertinent labs & imaging results that were available during my care of the patient were reviewed by me and considered in my medical decision making (see chart for details).     Patient has developed a cellulitis in the right upper extremity.  She wants to be treated as an outpatient.  Will Rx IV vancomycin and discharge home on doxycycline 100 mg.  Discussed with the patient and her  family.  Final Clinical Impressions(s) / ED Diagnoses   Final diagnoses:  Right forearm cellulitis    ED Discharge Orders         Ordered    doxycycline (VIBRAMYCIN) 100 MG capsule  2 times daily     11/25/18 1312           Linda Hess, Katriel Cutsforth, MD 11/28/18 2011

## 2018-12-01 ENCOUNTER — Other Ambulatory Visit: Payer: Self-pay | Admitting: Emergency Medicine

## 2018-12-01 ENCOUNTER — Other Ambulatory Visit (HOSPITAL_COMMUNITY): Payer: Self-pay | Admitting: Emergency Medicine

## 2018-12-01 DIAGNOSIS — L03113 Cellulitis of right upper limb: Secondary | ICD-10-CM

## 2018-12-02 ENCOUNTER — Ambulatory Visit (HOSPITAL_COMMUNITY)
Admission: RE | Admit: 2018-12-02 | Discharge: 2018-12-02 | Disposition: A | Discharge status: Home / Self Care | Payer: Medicare Other | Source: Ambulatory Visit | Attending: Emergency Medicine | Admitting: Emergency Medicine

## 2018-12-02 ENCOUNTER — Ambulatory Visit: Payer: Medicare Other

## 2018-12-02 DIAGNOSIS — L03113 Cellulitis of right upper limb: Secondary | ICD-10-CM | POA: Diagnosis not present

## 2018-12-14 ENCOUNTER — Other Ambulatory Visit: Payer: Self-pay

## 2018-12-14 ENCOUNTER — Encounter (HOSPITAL_COMMUNITY): Payer: Self-pay | Admitting: Emergency Medicine

## 2018-12-14 ENCOUNTER — Emergency Department (HOSPITAL_COMMUNITY): Payer: Medicare Other

## 2018-12-14 ENCOUNTER — Observation Stay (HOSPITAL_COMMUNITY)
Admission: EM | Admit: 2018-12-14 | Discharge: 2018-12-15 | Disposition: A | Discharge status: Home / Self Care | Payer: Medicare Other | Attending: Internal Medicine | Admitting: Internal Medicine

## 2018-12-14 DIAGNOSIS — E1165 Type 2 diabetes mellitus with hyperglycemia: Secondary | ICD-10-CM

## 2018-12-14 DIAGNOSIS — I129 Hypertensive chronic kidney disease with stage 1 through stage 4 chronic kidney disease, or unspecified chronic kidney disease: Secondary | ICD-10-CM | POA: Diagnosis not present

## 2018-12-14 DIAGNOSIS — N183 Chronic kidney disease, stage 3 unspecified: Secondary | ICD-10-CM | POA: Diagnosis present

## 2018-12-14 DIAGNOSIS — M79601 Pain in right arm: Secondary | ICD-10-CM | POA: Diagnosis present

## 2018-12-14 DIAGNOSIS — L03113 Cellulitis of right upper limb: Secondary | ICD-10-CM | POA: Diagnosis not present

## 2018-12-14 DIAGNOSIS — E1122 Type 2 diabetes mellitus with diabetic chronic kidney disease: Secondary | ICD-10-CM | POA: Diagnosis not present

## 2018-12-14 DIAGNOSIS — Z79899 Other long term (current) drug therapy: Secondary | ICD-10-CM | POA: Diagnosis not present

## 2018-12-14 DIAGNOSIS — IMO0002 Reserved for concepts with insufficient information to code with codable children: Secondary | ICD-10-CM | POA: Diagnosis present

## 2018-12-14 DIAGNOSIS — G6181 Chronic inflammatory demyelinating polyneuritis: Secondary | ICD-10-CM | POA: Insufficient documentation

## 2018-12-14 LAB — CBC WITH DIFFERENTIAL/PLATELET
Abs Immature Granulocytes: 0.09 10*3/uL — ABNORMAL HIGH (ref 0.00–0.07)
Basophils Absolute: 0 10*3/uL (ref 0.0–0.1)
Basophils Relative: 0 %
Eosinophils Absolute: 0.1 10*3/uL (ref 0.0–0.5)
Eosinophils Relative: 1 %
HCT: 44.8 % (ref 36.0–46.0)
HEMOGLOBIN: 13.6 g/dL (ref 12.0–15.0)
Immature Granulocytes: 1 %
LYMPHS PCT: 12 %
Lymphs Abs: 1.2 10*3/uL (ref 0.7–4.0)
MCH: 28.7 pg (ref 26.0–34.0)
MCHC: 30.4 g/dL (ref 30.0–36.0)
MCV: 94.5 fL (ref 80.0–100.0)
Monocytes Absolute: 1.1 10*3/uL — ABNORMAL HIGH (ref 0.1–1.0)
Monocytes Relative: 11 %
Neutro Abs: 7.5 10*3/uL (ref 1.7–7.7)
Neutrophils Relative %: 75 %
Platelets: 138 10*3/uL — ABNORMAL LOW (ref 150–400)
RBC: 4.74 MIL/uL (ref 3.87–5.11)
RDW: 13.2 % (ref 11.5–15.5)
WBC: 10 10*3/uL (ref 4.0–10.5)
nRBC: 0 % (ref 0.0–0.2)

## 2018-12-14 LAB — BASIC METABOLIC PANEL
Anion gap: 8 (ref 5–15)
BUN: 34 mg/dL — ABNORMAL HIGH (ref 8–23)
CO2: 25 mmol/L (ref 22–32)
Calcium: 9.4 mg/dL (ref 8.9–10.3)
Chloride: 103 mmol/L (ref 98–111)
Creatinine, Ser: 1.15 mg/dL — ABNORMAL HIGH (ref 0.44–1.00)
GFR calc Af Amer: 51 mL/min — ABNORMAL LOW (ref >60)
GFR calc non Af Amer: 44 mL/min — ABNORMAL LOW (ref >60)
Glucose, Bld: 236 mg/dL — ABNORMAL HIGH (ref 70–99)
Potassium: 4.4 mmol/L (ref 3.5–5.1)
Sodium: 136 mmol/L (ref 135–145)

## 2018-12-14 MED ORDER — SIMVASTATIN 10 MG PO TABS: 10.0000 mg | ORAL_TABLET | Freq: Every day | ORAL | Status: DC

## 2018-12-14 MED ORDER — PREDNISONE 20 MG PO TABS
20.0000 mg | ORAL_TABLET | Freq: Every day | ORAL | Status: DC
  Administered 2018-12-15: 20 mg via ORAL
  Filled 2018-12-14: qty 1

## 2018-12-14 MED ORDER — DOXYCYCLINE HYCLATE 100 MG PO TABS
100.0000 mg | ORAL_TABLET | Freq: Two times a day (BID) | ORAL | Status: DC
  Administered 2018-12-15: 100 mg via ORAL
  Filled 2018-12-14 (×5): qty 1

## 2018-12-14 MED ORDER — ACETAMINOPHEN 325 MG PO TABS: 650.0000 mg | ORAL_TABLET | Freq: Four times a day (QID) | ORAL | Status: DC | PRN

## 2018-12-14 MED ORDER — FOLIC ACID 1 MG PO TABS
1.0000 mg | ORAL_TABLET | Freq: Every morning | ORAL | Status: DC
  Administered 2018-12-15: 1 mg via ORAL
  Filled 2018-12-14: qty 1

## 2018-12-14 MED ORDER — GABAPENTIN 100 MG PO CAPS
100.0000 mg | ORAL_CAPSULE | Freq: Three times a day (TID) | ORAL | Status: DC
  Administered 2018-12-15: 100 mg via ORAL
  Filled 2018-12-14: qty 1

## 2018-12-14 MED ORDER — VANCOMYCIN HCL IN DEXTROSE 1-5 GM/200ML-% IV SOLN
1000.0000 mg | Freq: Once | INTRAVENOUS | Status: AC
  Administered 2018-12-14: 1000 mg via INTRAVENOUS
  Filled 2018-12-14: qty 200

## 2018-12-14 MED ORDER — VITAMIN C 500 MG PO TABS
500.0000 mg | ORAL_TABLET | Freq: Every day | ORAL | Status: DC
  Administered 2018-12-15: 500 mg via ORAL
  Filled 2018-12-14 (×3): qty 1

## 2018-12-14 MED ORDER — RIVAROXABAN 20 MG PO TABS
20.0000 mg | ORAL_TABLET | ORAL | Status: DC
  Administered 2018-12-15: 20 mg via ORAL
  Filled 2018-12-14 (×4): qty 1

## 2018-12-14 MED ORDER — TRAMADOL HCL 50 MG PO TABS
50.0000 mg | ORAL_TABLET | Freq: Four times a day (QID) | ORAL | Status: DC | PRN
  Administered 2018-12-14: 50 mg via ORAL
  Filled 2018-12-14: qty 1

## 2018-12-14 MED ORDER — FERROUS SULFATE 325 (65 FE) MG PO TABS
325.0000 mg | ORAL_TABLET | Freq: Every morning | ORAL | Status: DC
  Administered 2018-12-15: 325 mg via ORAL
  Filled 2018-12-14 (×3): qty 1

## 2018-12-14 MED ORDER — ACETAMINOPHEN 650 MG RE SUPP: 650.0000 mg | Freq: Four times a day (QID) | RECTAL | Status: DC | PRN

## 2018-12-14 NOTE — H&P (Signed)
History and Physical    PLEASE NOTE THAT DRAGON DICTATION SOFTWARE WAS USED IN THE CONSTRUCTION OF THIS NOTE.   SHANETTE TAMARGO GTX:[id number] DOB: June 06, 1931 DOA: 12/14/2018  PCP: [facility] Patient coming from: Home  I have personally briefly reviewed patient's old medical records in [place]  Chief Complaint: Right arm pain  HPI: Linda Hess is a 84 y.o. female with medical history significant for chronic inflammatory demyelinating polyneuropathy on chronic prednisone therapy, type 2 diabetes mellitus, who is admitted to [facility] on 12/14/2018 with right upper extremity cellulitis after presenting from home to the [facility] emergency department complaining of right upper extremity discomfort.  The following history is obtained via my discussions with the patient as well as HER-2 sons, who are present at bedside, in addition to my discussions with the emergency department physician and via chart review.  The patient reports 2 days of progressive right hand and right forearm discomfort associated with erythema, swelling, and increased warmth over this distribution.  Denies any recent trauma involving the right upper extremity.  Denies any change in sensation or any discharge from the after mentioned distribution.  Denies subjective fever, chills, or rigors.  Denies chest pain, shortness of breath, nausea, vomiting, or diarrhea.  The patient reports that she had presented to the emergency department approximately 3 weeks ago with a very similar presentation, at which time she was diagnosed with cellulitis and discharged from the emergency department on oral doxycycline upon which she reports the right are upper extremity pain, swelling, and erythema completely resolved before return to the after mentioned symptoms 2 days ago.  The patient reports a history of chronic inflammatory demyelinating polyneuropathy for which she is on  chronic prednisone therapy, typically 5 mg p.o. daily as such.  However, approximately 1 month ago, in the setting of a flare of her chronic inflammatory demyelinating polyneuropathy, her dose prednisone was increased from 5 to 20 mg p.o. daily.    ED course: Vital signs in the emergency department aceta were notable for the following: Temperature 98.2, heart rate 60-70, blood pressure 144/65, respiratory rate 18-20.  Labs from in the ED were notable for the following: BMP notable for creatinine of 1.15 relative to 1.16 when checked on 11/26/2015.  Glucose per BMP noted to be 236.  CBC notable for white blood cell count of 10,000 and with 75% neutrophils.  Plain films of the right forearm, per final radiology report showed soft tissue swelling associated with the forearm in the absence of any evidence of fracture.  While still in the ED, 1 dose of IV vancomycin was administered.  Subsequently, the patient was admitted for observation for further evaluation and management of presenting suspected right upper extremity cellulitis.    Review of Systems: As per HPI otherwise 10 point review of systems negative.   Past Medical History:  Diagnosis Date  . CIDP (chronic inflammatory demyelinating polyneuropathy) ([place])   . Essential hypertension, benign    pt denies  . History of DVT (deep vein thrombosis)    Reportedly left leg, details not clear  . Mixed hyperlipidemia   . Type 2 diabetes mellitus ([place])     Past Surgical History:  Procedure Laterality Date  . CHOLECYSTECTOMY    . Left quadriceps muscle biopsy  March 2010  . Right knee replacement  September 2014    Social History:  reports that she has never smoked. She has never used smokeless tobacco. She  reports that she does not drink alcohol or use drugs.   Allergies  Allergen Reactions  . Amoxicillin-Pot Clavulanate Hives  . Other Hives and Itching    RED MEAT  . Penicillins     hives    Family History  Problem Relation Age  of Onset  . Heart attack Father   . Stroke Mother   . Cancer Brother      Prior to Admission medications   Medication Sig Start Date End Date Taking? Authorizing Provider  acetaminophen (TYLENOL) 325 MG tablet Take 325 mg by mouth every 6 (six) hours as needed for mild pain.   Yes [provider]  calcium-vitamin D (OSCAL WITH D) 500-200 MG-UNIT per tablet Take 1 tablet by mouth at bedtime.  03/14/14  Yes Satira Sark, MD  ferrous sulfate 325 (65 FE) MG tablet Take 325 mg by mouth every morning.  03/14/14  Yes [provider]  folic acid (FOLVITE) 1 MG tablet Take 1 mg by mouth every morning.    Yes [provider]  gabapentin (NEURONTIN) 100 MG capsule Take 100 mg by mouth 3 (three) times daily.  08/30/18  Yes [provider]  JANUVIA 25 MG tablet TAKE (1) TABLET BY MOUTH DAILY FOR DIABETES Patient taking differently: Take 25 mg by mouth every morning.  07/30/16  Yes Nida, Marella Chimes, MD  Omega-3 Fatty Acids (FISH OIL) 1200 MG CAPS Take 1 capsule by mouth every morning.    Yes [provider]  predniSONE (DELTASONE) 20 MG tablet Take 20 mg by mouth daily.    Yes [provider]  rivaroxaban (XARELTO) 20 MG TABS tablet Take 20 mg by mouth every morning.   Yes [provider]  simvastatin (ZOCOR) 10 MG tablet Take 10 mg by mouth at bedtime.    Yes [provider]  traMADol (ULTRAM) 50 MG tablet Take 50 mg by mouth every 6 (six) hours as needed for moderate pain.    Yes [provider]  vitamin C (ASCORBIC ACID) 500 MG tablet Take 500 mg by mouth daily.   Yes [provider]  doxycycline (VIBRAMYCIN) 100 MG capsule Take 1 capsule (100 mg total) by mouth 2 (two) times daily. Patient not taking: Reported on 12/14/2018 11/25/18   Nat Christen, MD     Objective     Physical Exam: Vitals:   12/14/18 1728 12/14/18 1730 12/14/18 2025  BP: (!) 144/65  (!) 142/70  Pulse: 70  68  Resp: 18  20  Temp:  98.2 F (36.8 C)  98.5 F (36.9 C)  TempSrc: Oral  Oral  SpO2: 96%  98%  Weight:  73.5 kg   Height:  '5\' 6"'$  (1.676 m)     General: appears to be stated age; alert, oriented Skin: Multiple small, healing abrasions associated with the right upper extremity.  Erythema is stated with the right hand radiating proximally into the right forearm associated with increased warmth and swelling as well as tenderness is noted.  No associated discharge noted. Head:  AT/[place] Eyes:  PEARL b/l, EOMI Mouth:  Oral mucosa membranes appear moist, normal dentition Neck: supple; trachea midline Heart:  RRR; did not appreciate any M/R/G Lungs: CTAB, did not appreciate any wheezes, rales, or rhonchi Abdomen: + BS; soft, ND, NT Extremities: Right upper extremity erythema, swelling, increased warmth, as further noted above.  Bilateral lower extremities demonstrate 1+ pretibial edema.    Labs on Admission: I have personally reviewed following labs and imaging studies  CBC: Recent Labs  Lab 12/14/18 1953  WBC 10.0  NEUTROABS 7.5  HGB 13.6  HCT 44.8  MCV 94.5  PLT 518*   Basic Metabolic Panel: Recent Labs  Lab 12/14/18 1953  NA 136  K 4.4  CL 103  CO2 25  GLUCOSE 236*  BUN 34*  CREATININE 1.15*  CALCIUM 9.4   GFR: Estimated Creatinine Clearance: 37.4 mL/min (A) (by C-G formula based on SCr of 1.15 mg/dL (H)). Liver Function Tests: No results for input(s): AST, ALT, ALKPHOS, BILITOT, PROT, ALBUMIN in the last 168 hours. No results for input(s): LIPASE, AMYLASE in the last 168 hours. No results for input(s): AMMONIA in the last 168 hours. Coagulation Profile: No results for input(s): INR, PROTIME in the last 168 hours. Cardiac Enzymes: No results for input(s): CKTOTAL, CKMB, CKMBINDEX, TROPONINI in the last 168 hours. BNP (last 3 results) No results for input(s): PROBNP in the last 8760 hours. HbA1C: No results for input(s): HGBA1C in the last 72 hours. CBG: No results for input(s): GLUCAP  in the last 168 hours. Lipid Profile: No results for input(s): CHOL, HDL, LDLCALC, TRIG, CHOLHDL, LDLDIRECT in the last 72 hours. Thyroid Function Tests: No results for input(s): TSH, T4TOTAL, FREET4, T3FREE, THYROIDAB in the last 72 hours. Anemia Panel: No results for input(s): VITAMINB12, FOLATE, FERRITIN, TIBC, IRON, RETICCTPCT in the last 72 hours. Urine analysis:    Component Value Date/Time   COLORURINE Yellow 08/22/2013 1201   COLORURINE AMBER BIOCHEMICALS MAY BE AFFECTED BY COLOR (A) 10/19/2008 2225   APPEARANCEUR Hazy 08/22/2013 1201   LABSPEC 1.018 08/22/2013 1201   PHURINE 6.0 08/22/2013 1201   PHURINE 5.5 10/19/2008 2225   GLUCOSEU >=500 08/22/2013 1201   HGBUR Negative 08/22/2013 1201   HGBUR MODERATE (A) 10/19/2008 2225   BILIRUBINUR Negative 08/22/2013 1201   KETONESUR Trace 08/22/2013 [place] 10/19/2008 2225   PROTEINUR Negative 08/22/2013 1201   PROTEINUR >300 (A) 10/19/2008 2225   UROBILINOGEN 1.0 10/19/2008 2225   NITRITE Negative 08/22/2013 1201   NITRITE NEGATIVE 10/19/2008 2225   LEUKOCYTESUR Negative 08/22/2013 1201    Radiological Exams on Admission: Dg Forearm Right  Result Date: 12/14/2018 CLINICAL DATA:  Right arm pain and edema for the past 2 days. EXAM: RIGHT FOREARM - 2 VIEW COMPARISON:  None. FINDINGS: Suspected soft tissue swelling about the forearm. No associated fracture or radiopaque foreign body. Limited visualization of the elbow and wrist is normal given obliquity and large field of view. IMPRESSION: Soft tissue swelling about the forearm without associated fracture or radiopaque foreign body. Electronically Signed   By: Sandi Mariscal M.D.   On: 12/14/2018 20:21      Assessment/Plan   BRIELLE MORO is a 84 y.o. female with medical history significant for chronic inflammatory demyelinating polyneuropathy on chronic prednisone therapy, type 2 diabetes mellitus, who is admitted to [facility] on 12/14/2018 with  right upper extremity cellulitis after presenting from home to the [facility] emergency department complaining of right upper extremity discomfort.   Principal Problem:   Cellulitis of right upper extremity Active Problems:   Uncontrolled type 2 diabetes mellitus with stage 3 chronic kidney disease (HCC)   CIDP (chronic inflammatory demyelinating polyneuropathy) (HCC)   #) Right upper extremity cellulitis: Diagnosis on the basis of presenting 2 days of progressive right upper extremity erythema, swelling, increased warmth, and tenderness.  In the setting of chronic prednisone therapy, thinning of skin is noted and associated with multiple small abrasions on  the right upper extremity, likely representing portal of entry leading to cellulitis in addition to associated relative immunocompromisation in the setting of chronic prednisone therapy.  In the absence of any purulent discharge, MRSA is less likely.  Presentation not suggestive of necrotizing fasciitis or compartment syndrome.  No sirs criteria are met at this time and therefore patient not considered to be septic.  There are some limitations in available antibiotic options in the setting of patient's history of allergies to the penicillin class, which include development of hives.  However, given the appearance of mid cellulitis in absence of sepsis, and similar prior presentation that resolved with course of doxycycline, I will plan to treat with doxycycline at this time.   Plan: start oral doxycycline after obtaining blood cultures x 2. Repeat CBC in the AM. I have placed a nursing communication order requesting that current distribution of erythema be outlined. Prn acetaminophen for pain.     #) Chronic Inflammatory Demyelinating Polyneuropathy: On chronic prednisone therapy, typically 5 mg p.o. daily.  However, approximately 1 month ago in the setting of an exacerbation thereof, dose of chronic prednisone therapy was increased to 20  mg p.o. daily, where it subsequently remains.  Plan: Continue current prednisone 20 mg p.o. daily.  Continue home gabapentin.   Type 2 diabetes mellitus: On Januvia as an outpatient.  There appears to be a slight worsening of patient's recent glycemic control in the setting of after mentioned increase in dose of chronic prednisone therapy.  Of note, presenting blood sugar per presenting BMP noted to be 236.  Plan: Accu-Cheks q. before meals and at bedtime with sliding scale insulin.   #) Hyperlipidemia: On simvastatin as an outpatient.  Plan: Continue home simvastatin.   DVT prophylaxis: continue home Xarelto (for history of unprovoked left lower extremity DVT).  Code Status: full Family Communication: discussed patient's case with her 2 sons, who were present at bedside. Disposition Plan:  Per Rounding Team Consults called: None Admission status: Observation/MedSurg   PLEASE NOTE THAT DRAGON DICTATION SOFTWARE WAS USED IN THE CONSTRUCTION OF THIS NOTE.   [place] [facility] Pager [phone] From 3PM- 11PM.   Otherwise, please contact night-coverage  [email or web address] Password [email or web address]  12/14/2018, 9:55 PM

## 2018-12-14 NOTE — ED Provider Notes (Signed)
[facility] EMERGENCY DEPARTMENT Provider Note   CSN: [id number] Arrival date & time: 12/14/18  1718     History   Chief Complaint Chief Complaint  Patient presents with  . Arm Pain    HPI Linda Hess is a 84 y.o. female.  Patient complains of pain and swelling to her right arm.  She has had a cellulitis there recently.  She believes the infection has come back  The history is provided by the patient. No language interpreter was used.  Arm Pain  This is a new problem. The current episode started more than 2 days ago. The problem occurs constantly. The problem has not changed since onset.Pertinent negatives include no chest pain, no abdominal pain and no headaches. Nothing aggravates the symptoms. Nothing relieves the symptoms. She has tried nothing for the symptoms. The treatment provided no relief.    Past Medical History:  Diagnosis Date  . CIDP (chronic inflammatory demyelinating polyneuropathy) (HCC)   . Essential hypertension, benign    pt denies  . History of DVT (deep vein thrombosis)    Reportedly left leg, details not clear  . Mixed hyperlipidemia   . Type 2 diabetes mellitus [facility])     Patient Active Problem List   Diagnosis Date Noted  . Peripheral arterial disease (HCC) 03/14/2014  . CIDP (chronic inflammatory demyelinating polyneuropathy) (HCC) 03/14/2014  . Mixed hyperlipidemia 03/12/2014  . Essential hypertension, benign 03/12/2014  . Uncontrolled type 2 diabetes mellitus with stage 3 chronic kidney disease (HCC) 03/12/2014    Past Surgical History:  Procedure Laterality Date  . CHOLECYSTECTOMY    . Left quadriceps muscle biopsy  March 2010  . Right knee replacement  September 2014     OB History    Gravida  4   Para  4   Term  4   Preterm      AB      Living        SAB      TAB      Ectopic      Multiple      Live Births               Home Medications    Prior to Admission medications   Medication Sig Start  Date End Date Taking? Authorizing Provider  acetaminophen (TYLENOL) 325 MG tablet Take 325 mg by mouth every 6 (six) hours as needed for mild pain.   Yes [provider]  calcium-vitamin D (OSCAL WITH D) 500-200 MG-UNIT per tablet Take 1 tablet by mouth at bedtime.  03/14/14  Yes Jonelle Sidle, MD  ferrous sulfate 325 (65 FE) MG tablet Take 325 mg by mouth every morning.  03/14/14  Yes [provider]  folic acid (FOLVITE) 1 MG tablet Take 1 mg by mouth every morning.    Yes [provider]  gabapentin (NEURONTIN) 100 MG capsule Take 100 mg by mouth 3 (three) times daily.  08/30/18  Yes [provider]  JANUVIA 25 MG tablet TAKE (1) TABLET BY MOUTH DAILY FOR DIABETES Patient taking differently: Take 25 mg by mouth every morning.  07/30/16  Yes Nida, Denman George, MD  Omega-3 Fatty Acids (FISH OIL) 1200 MG CAPS Take 1 capsule by mouth every morning.    Yes [provider]  predniSONE (DELTASONE) 20 MG tablet Take 20 mg by mouth daily.    Yes [provider]  rivaroxaban (XARELTO) 20 MG TABS tablet Take 20 mg by mouth every morning.  Yes [provider]  simvastatin (ZOCOR) 10 MG tablet Take 10 mg by mouth at bedtime.    Yes [provider]  traMADol (ULTRAM) 50 MG tablet Take 50 mg by mouth every 6 (six) hours as needed for moderate pain.    Yes [provider]  vitamin C (ASCORBIC ACID) 500 MG tablet Take 500 mg by mouth daily.   Yes [provider]  doxycycline (VIBRAMYCIN) 100 MG capsule Take 1 capsule (100 mg total) by mouth 2 (two) times daily. Patient not taking: Reported on 12/14/2018 11/25/18   Donnetta Hutching, MD    Family History Family History  Problem Relation Age of Onset  . Heart attack Father   . Stroke Mother   . Cancer Brother     Social History Social History   Tobacco Use  . Smoking status: Never Smoker  . Smokeless tobacco: Never Used  Substance Use Topics  . Alcohol use: No    . Drug use: No     Allergies   Amoxicillin-pot clavulanate; Other; and Penicillins   Review of Systems Review of Systems  Constitutional: Negative for appetite change and fatigue.  HENT: Negative for congestion, ear discharge and sinus pressure.   Eyes: Negative for discharge.  Respiratory: Negative for cough.   Cardiovascular: Negative for chest pain.  Gastrointestinal: Negative for abdominal pain and diarrhea.  Genitourinary: Negative for frequency and hematuria.  Musculoskeletal: Negative for back pain.       Pain swelling right arm  Skin: Negative for rash.  Neurological: Negative for seizures and headaches.  Psychiatric/Behavioral: Negative for hallucinations.     Physical Exam Updated Vital Signs BP (!) 142/70 (BP Location: Left Arm)   Pulse 68   Temp 98.5 F (36.9 C) (Oral)   Resp 20   Ht 5\' 6"  (1.676 m)   Wt 73.5 kg   SpO2 98%   BMI 26.15 kg/m   Physical Exam Constitutional:      Appearance: She is well-developed.  HENT:     Head: Normocephalic.     Nose: Nose normal.     Mouth/Throat:     Mouth: Mucous membranes are moist.  Eyes:     General: No scleral icterus.    Conjunctiva/sclera: Conjunctivae normal.  Neck:     Musculoskeletal: Neck supple.     Thyroid: No thyromegaly.  Cardiovascular:     Rate and Rhythm: Normal rate and regular rhythm.     Heart sounds: No murmur. No friction rub. No gallop.   Pulmonary:     Breath sounds: No stridor. No wheezing or rales.  Chest:     Chest wall: No tenderness.  Abdominal:     General: There is no distension.     Tenderness: There is no abdominal tenderness. There is no rebound.  Musculoskeletal: Normal range of motion.     Comments: Right forearm swollen tender red.  Lymphadenopathy:     Cervical: No cervical adenopathy.  Skin:    Findings: No erythema or rash.  Neurological:     Mental Status: She is oriented to person, place, and time.     Motor: No abnormal muscle tone.     Coordination:  Coordination normal.  Psychiatric:        Behavior: Behavior normal.      ED Treatments / Results  Labs (all labs ordered are listed, but only abnormal results are displayed) Labs Reviewed  CBC WITH DIFFERENTIAL/PLATELET - Abnormal; Notable for the following components:      Result  Value   Platelets 138 (*)    Monocytes Absolute 1.1 (*)    Abs Immature Granulocytes 0.09 (*)    All other components within normal limits  BASIC METABOLIC PANEL - Abnormal; Notable for the following components:   Glucose, Bld 236 (*)    BUN 34 (*)    Creatinine, Ser 1.15 (*)    GFR calc non Af Amer 44 (*)    GFR calc Af Amer 51 (*)    All other components within normal limits    EKG None  Radiology Dg Forearm Right  Result Date: 12/14/2018 CLINICAL DATA:  Right arm pain and edema for the past 2 days. EXAM: RIGHT FOREARM - 2 VIEW COMPARISON:  None. FINDINGS: Suspected soft tissue swelling about the forearm. No associated fracture or radiopaque foreign body. Limited visualization of the elbow and wrist is normal given obliquity and large field of view. IMPRESSION: Soft tissue swelling about the forearm without associated fracture or radiopaque foreign body. Electronically Signed   By: Simonne ComeJohn  Watts M.D.   On: 12/14/2018 20:21    Procedures Procedures (including critical care time)  Medications Ordered in ED Medications  vancomycin (VANCOCIN) IVPB 1000 mg/200 mL premix (0 mg Intravenous Stopped 12/14/18 2126)     Initial Impression / Assessment and Plan / ED Course  I have reviewed the triage vital signs and the nursing notes.  Pertinent labs & imaging results that were available during my care of the patient were reviewed by me and considered in my medical decision making (see chart for details).     Patient with cellulitis to right forearm.  She will be admitted to medicine for IV antibiotics  Final Clinical Impressions(s) / ED Diagnoses   Final diagnoses:  Right arm pain    ED  Discharge Orders    None       Bethann BerkshireZammit, Francoise Chojnowski, MD 12/14/18 2131

## 2018-12-14 NOTE — ED Triage Notes (Signed)
Patient with edema and pain to her R arm, started 2 days ago. Radial pulse 2+, cap refill <3 sec. Patient reports she has a history of blood clots and is on Xarelto. Has been seen here 11/29 for same type symptoms.

## 2018-12-15 ENCOUNTER — Other Ambulatory Visit: Payer: Self-pay

## 2018-12-15 DIAGNOSIS — L03113 Cellulitis of right upper limb: Secondary | ICD-10-CM

## 2018-12-15 LAB — GLUCOSE, CAPILLARY
GLUCOSE-CAPILLARY: 196 mg/dL — AB (ref 70–99)
Glucose-Capillary: 202 mg/dL — ABNORMAL HIGH (ref 70–99)

## 2018-12-15 LAB — CBC
HCT: 39.3 % (ref 36.0–46.0)
Hemoglobin: 12 g/dL (ref 12.0–15.0)
MCH: 29.1 pg (ref 26.0–34.0)
MCHC: 30.5 g/dL (ref 30.0–36.0)
MCV: 95.2 fL (ref 80.0–100.0)
NRBC: 0 % (ref 0.0–0.2)
Platelets: 118 10*3/uL — ABNORMAL LOW (ref 150–400)
RBC: 4.13 MIL/uL (ref 3.87–5.11)
RDW: 13.2 % (ref 11.5–15.5)
WBC: 8 10*3/uL (ref 4.0–10.5)

## 2018-12-15 LAB — BASIC METABOLIC PANEL
Anion gap: 6 (ref 5–15)
BUN: 34 mg/dL — ABNORMAL HIGH (ref 8–23)
CO2: 26 mmol/L (ref 22–32)
CREATININE: 1.23 mg/dL — AB (ref 0.44–1.00)
Calcium: 8.7 mg/dL — ABNORMAL LOW (ref 8.9–10.3)
Chloride: 104 mmol/L (ref 98–111)
GFR calc Af Amer: 47 mL/min — ABNORMAL LOW (ref >60)
GFR, EST NON AFRICAN AMERICAN: 40 mL/min — AB (ref >60)
GLUCOSE: 229 mg/dL — AB (ref 70–99)
Potassium: 4.5 mmol/L (ref 3.5–5.1)
Sodium: 136 mmol/L (ref 135–145)

## 2018-12-15 MED ORDER — INSULIN ASPART 100 UNIT/ML ~~LOC~~ SOLN
0.0000 [IU] | Freq: Three times a day (TID) | SUBCUTANEOUS | Status: DC
  Administered 2018-12-15: 5 [IU] via SUBCUTANEOUS

## 2018-12-15 MED ORDER — INSULIN ASPART 100 UNIT/ML ~~LOC~~ SOLN: 0.0000 [IU] | Freq: Every day | SUBCUTANEOUS | Status: DC

## 2018-12-15 MED ORDER — DOXYCYCLINE HYCLATE 100 MG PO TABS: 100.0000 mg | ORAL_TABLET | Freq: Two times a day (BID) | ORAL | 0 refills | Status: AC

## 2018-12-15 NOTE — Discharge Summary (Signed)
Physician Discharge Summary  Linda Hess ZOX:[id number] DOB: 11-22-1931 DOA: 12/14/2018  PCP: [facility]  Admit date: 12/14/2018  Discharge date: 12/15/2018  Admitted From:Home  Disposition:  Home  Recommendations for Outpatient Follow-up:  1. Follow up with PCP in 1-2 weeks 2. Please obtain compression sleeve as noted in AVS 3. Continue on doxycycline for 10 days to finish course of treatment for cellulitis  Home Health: None  Equipment/Devices: None  Discharge Condition: Stable  CODE STATUS: Full  Diet recommendation: Heart Healthy/carb modified  Brief/Interim Summary: Per HPI: Linda Hess is a 84 y.o. female with medical history significant for chronic inflammatory demyelinating polyneuropathy on chronic prednisone therapy, type 2 diabetes mellitus, who is admitted to [facility] on 12/14/2018 with right upper extremity cellulitis after presenting from home to the [facility] emergency department complaining of right upper extremity discomfort.  She has been started on oral doxycycline and is doing some better this morning.  She is stable for discharge and will need a upper extremity sleeve to prevent any further cuts or bruising to her arms as she is quite independent and active.  She will follow-up with her primary care doctor in the next 1 week to reevaluate.  She is to otherwise remain on the rest of her medications as prescribed.  No other acute events during this brief admission noted.  Discharge Diagnoses:  Principal Problem:   Cellulitis of right upper extremity Active Problems:   Uncontrolled type 2 diabetes mellitus with stage 3 chronic kidney disease (HCC)   CIDP (chronic inflammatory demyelinating polyneuropathy) (HCC)  Principal discharge diagnosis: Cellulitis of right upper extremity.  Discharge Instructions  Discharge Instructions    Diet - low sodium heart healthy   Complete by:  As directed     Increase activity slowly   Complete by:  As directed      Allergies as of 12/15/2018      Reactions   Amoxicillin-pot Clavulanate Hives   Other Hives, Itching   RED MEAT   Penicillins    hives      Medication List    STOP taking these medications   doxycycline 100 MG capsule Commonly known as:  VIBRAMYCIN Replaced by:  doxycycline 100 MG tablet     TAKE these medications   acetaminophen 325 MG tablet Commonly known as:  TYLENOL Take 325 mg by mouth every 6 (six) hours as needed for mild pain.   calcium-vitamin D 500-200 MG-UNIT tablet Commonly known as:  OSCAL WITH D Take 1 tablet by mouth at bedtime.   doxycycline 100 MG tablet Commonly known as:  VIBRA-TABS Take 1 tablet (100 mg total) by mouth 2 (two) times daily for 10 days. Replaces:  doxycycline 100 MG capsule   ferrous sulfate 325 (65 FE) MG tablet Take 325 mg by mouth every morning.   Fish Oil 1200 MG Caps Take 1 capsule by mouth every morning.   folic acid 1 MG tablet Commonly known as:  FOLVITE Take 1 mg by mouth every morning.   gabapentin 100 MG capsule Commonly known as:  NEURONTIN Take 100 mg by mouth 3 (three) times daily.   JANUVIA 25 MG tablet Generic drug:  sitaGLIPtin TAKE (1) TABLET BY MOUTH DAILY FOR DIABETES What changed:  See the new instructions.   predniSONE 20 MG tablet Commonly known as:  DELTASONE Take 20 mg by mouth daily.   rivaroxaban 20 MG Tabs tablet Commonly known as:  XARELTO Take 20 mg  by mouth every morning.   simvastatin 10 MG tablet Commonly known as:  ZOCOR Take 10 mg by mouth at bedtime.   traMADol 50 MG tablet Commonly known as:  ULTRAM Take 50 mg by mouth every 6 (six) hours as needed for moderate pain.   vitamin C 500 MG tablet Commonly known as:  ASCORBIC ACID Take 500 mg by mouth daily.      Follow-up Information    [facility] Follow up.   Why:  please go to [facility] and get a compression sleeve for your arm Contact  information: [address] [place] [address] [phone]       [facility] Follow up in 1 week(s).   Contact information: [address] [place] [address] [phone]          Allergies  Allergen Reactions  . Amoxicillin-Pot Clavulanate Hives  . Other Hives and Itching    RED MEAT  . Penicillins     hives    Consultations:  None   Procedures/Studies: Dg Forearm Right  Result Date: 12/14/2018 CLINICAL DATA:  Right arm pain and edema for the past 2 days. EXAM: RIGHT FOREARM - 2 VIEW COMPARISON:  None. FINDINGS: Suspected soft tissue swelling about the forearm. No associated fracture or radiopaque foreign body. Limited visualization of the elbow and wrist is normal given obliquity and large field of view. IMPRESSION: Soft tissue swelling about the forearm without associated fracture or radiopaque foreign body. Electronically Signed   By: Simonne Come M.D.   On: 12/14/2018 20:21   US Venous Img Upper Uni Right  Result Date: 12/02/2018 CLINICAL DATA:  Right upper extremity pain and edema. History of upper arm cellulitis. History of prior lower extremity DVT. Patient is currently on anticoagulation. Evaluate for DVT. EXAM: RIGHT UPPER EXTREMITY VENOUS DOPPLER ULTRASOUND TECHNIQUE: Gray-scale sonography with graded compression, as well as color Doppler and duplex ultrasound were performed to evaluate the upper extremity deep venous system from the level of the subclavian vein and including the jugular, axillary, basilic, radial, ulnar and upper cephalic vein. Spectral Doppler was utilized to evaluate flow at rest and with distal augmentation maneuvers. COMPARISON:  None. FINDINGS: Contralateral Subclavian Vein: Respiratory phasicity is normal and symmetric with the symptomatic side. No evidence of thrombus. Normal compressibility. Internal Jugular Vein: No evidence of thrombus. Normal compressibility, respiratory phasicity and response to  augmentation. Subclavian Vein: No evidence of thrombus. Normal compressibility, respiratory phasicity and response to augmentation. Axillary Vein: No evidence of thrombus. Normal compressibility, respiratory phasicity and response to augmentation. Cephalic Vein: No evidence of thrombus. Normal compressibility, respiratory phasicity and response to augmentation. Basilic Vein: No evidence of thrombus. Normal compressibility, respiratory phasicity and response to augmentation. Brachial Veins: No evidence of thrombus. Normal compressibility, respiratory phasicity and response to augmentation. Radial Veins: No evidence of thrombus. Normal compressibility, respiratory phasicity and response to augmentation. Ulnar Veins: No evidence of thrombus. Normal compressibility, respiratory phasicity and response to augmentation. Venous Reflux:  None visualized. Other Findings:  None visualized. IMPRESSION: No evidence of DVT within the right upper extremity. Electronically Signed   By: Simonne Come M.D.   On: 12/02/2018 11:06     Discharge Exam: Vitals:   12/14/18 2325 12/15/18 0525  BP: (!) 143/53 112/64  Pulse: 69 63  Resp: 17 16  Temp: 98.2 F (36.8 C) 98.5 F (36.9 C)  SpO2: 99% 98%   Vitals:   12/14/18 2025 12/14/18 2325 12/15/18 0525 12/15/18  0700  BP: (!) 142/70 (!) 143/53 112/64   Pulse: 68 69 63   Resp: 20 17 16    Temp: 98.5 F (36.9 C) 98.2 F (36.8 C) 98.5 F (36.9 C)   TempSrc: Oral Oral Oral   SpO2: 98% 99% 98%   Weight:    72.5 kg  Height:        General: Pt is alert, awake, not in acute distress Cardiovascular: RRR, S1/S2 +, no rubs, no gallops Respiratory: CTA bilaterally, no wheezing, no rhonchi Abdominal: Soft, NT, ND, bowel sounds + Extremities: no edema, no cyanosis; right upper extremity with ecchymosis and minimal tenderness that is improved    The results of significant diagnostics from this hospitalization (including imaging, microbiology, ancillary and laboratory) are  listed below for reference.     Microbiology: Recent Results (from the past 240 hour(s))  Culture, blood (routine x 2)     Status: None (Preliminary result)   Collection Time: 12/15/18  7:10 AM  Result Value Ref Range Status   Specimen Description BLOOD RIGHT ARM  Final   Special Requests   Final    BOTTLES DRAWN AEROBIC AND ANAEROBIC Blood Culture results may not be optimal due to an excessive volume of blood received in culture bottles   Culture   Final    NO GROWTH < 12 HOURS Performed at [facility], [address] [place], [place] [address]    Report Status PENDING  Incomplete  Culture, blood (routine x 2)     Status: None (Preliminary result)   Collection Time: 12/15/18  7:10 AM  Result Value Ref Range Status   Specimen Description BLOOD LEFT FOREARM  Final   Special Requests   Final    BOTTLES DRAWN AEROBIC AND ANAEROBIC Blood Culture adequate volume   Culture   Final    NO GROWTH < 12 HOURS Performed at [facility], [address] [place], [place] [address]    Report Status PENDING  Incomplete     Labs: BNP (last 3 results) No results for input(s): BNP in the last 8760 hours. Basic Metabolic Panel: Recent Labs  Lab 12/14/18 1953 12/15/18 0503  NA 136 136  K 4.4 4.5  CL 103 104  CO2 25 26  GLUCOSE 236* 229*  BUN 34* 34*  CREATININE 1.15* 1.23*  CALCIUM 9.4 8.7*   Liver Function Tests: No results for input(s): AST, ALT, ALKPHOS, BILITOT, PROT, ALBUMIN in the last 168 hours. No results for input(s): LIPASE, AMYLASE in the last 168 hours. No results for input(s): AMMONIA in the last 168 hours. CBC: Recent Labs  Lab 12/14/18 1953 12/15/18 0503  WBC 10.0 8.0  NEUTROABS 7.5  --   HGB 13.6 12.0  HCT 44.8 39.3  MCV 94.5 95.2  PLT 138* 118*   Cardiac Enzymes: No results for input(s): CKTOTAL, CKMB, CKMBINDEX, TROPONINI in the last 168 hours. BNP: Invalid input(s): POCBNP CBG: Recent Labs  Lab 12/15/18 0731 12/15/18 1108  GLUCAP 202* 196*    D-Dimer No results for input(s): DDIMER in the last 72 hours. Hgb A1c No results for input(s): HGBA1C in the last 72 hours. Lipid Profile No results for input(s): CHOL, HDL, LDLCALC, TRIG, CHOLHDL, LDLDIRECT in the last 72 hours. Thyroid function studies No results for input(s): TSH, T4TOTAL, T3FREE, THYROIDAB in the last 72 hours.  Invalid input(s): FREET3 Anemia work up No results for input(s): VITAMINB12, FOLATE, FERRITIN, TIBC, IRON, RETICCTPCT in the last 72 hours. Urinalysis    Component Value Date/Time  COLORURINE Yellow 08/22/2013 1201   COLORURINE AMBER BIOCHEMICALS MAY BE AFFECTED BY COLOR (A) 10/19/2008 2225   APPEARANCEUR Hazy 08/22/2013 1201   LABSPEC 1.018 08/22/2013 1201   PHURINE 6.0 08/22/2013 1201   PHURINE 5.5 10/19/2008 2225   GLUCOSEU >=500 08/22/2013 1201   HGBUR Negative 08/22/2013 1201   HGBUR MODERATE (A) 10/19/2008 2225   BILIRUBINUR Negative 08/22/2013 1201   KETONESUR Trace 08/22/2013 1201   KETONESUR NEGATIVE 10/19/2008 2225   PROTEINUR Negative 08/22/2013 1201   PROTEINUR >300 (A) 10/19/2008 2225   UROBILINOGEN 1.0 10/19/2008 2225   NITRITE Negative 08/22/2013 1201   NITRITE NEGATIVE 10/19/2008 2225   LEUKOCYTESUR Negative 08/22/2013 1201   Sepsis Labs Invalid input(s): PROCALCITONIN,  WBC,  LACTICIDVEN Microbiology Recent Results (from the past 240 hour(s))  Culture, blood (routine x 2)     Status: None (Preliminary result)   Collection Time: 12/15/18  7:10 AM  Result Value Ref Range Status   Specimen Description BLOOD RIGHT ARM  Final   Special Requests   Final    BOTTLES DRAWN AEROBIC AND ANAEROBIC Blood Culture results may not be optimal due to an excessive volume of blood received in culture bottles   Culture   Final    NO GROWTH < 12 HOURS Performed at [facility], [address] [place], [place] [address]    Report Status PENDING  Incomplete  Culture, blood (routine x 2)     Status: None (Preliminary result)   Collection  Time: 12/15/18  7:10 AM  Result Value Ref Range Status   Specimen Description BLOOD LEFT FOREARM  Final   Special Requests   Final    BOTTLES DRAWN AEROBIC AND ANAEROBIC Blood Culture adequate volume   Culture   Final    NO GROWTH < 12 HOURS Performed at [facility], [address] [place], [place] [address]    Report Status PENDING  Incomplete     Time coordinating discharge: 35 minutes  SIGNED:   Erick BlinksPratik D Raziel Koenigs, DO [facility] 12/15/2018, 11:12 AM Pager [phone]  If 7PM-7AM, please contact night-coverage [email or web address] Password [email or web address]

## 2018-12-15 NOTE — Progress Notes (Signed)
Pt IV removed, WNL. D/C instructions given to pt. Verbalized understanding. Pt family members at bedside to transport home.

## 2018-12-20 LAB — CULTURE, BLOOD (ROUTINE X 2)
Culture: NO GROWTH
Culture: NO GROWTH
Special Requests: ADEQUATE

## 2019-04-02 ENCOUNTER — Emergency Department (HOSPITAL_COMMUNITY): Payer: Medicare Other

## 2019-04-02 ENCOUNTER — Other Ambulatory Visit: Payer: Self-pay

## 2019-04-02 ENCOUNTER — Emergency Department (HOSPITAL_COMMUNITY)
Admission: EM | Admit: 2019-04-02 | Discharge: 2019-04-02 | Disposition: A | Discharge status: Home / Self Care | Payer: Medicare Other | Attending: Emergency Medicine | Admitting: Emergency Medicine

## 2019-04-02 ENCOUNTER — Encounter (HOSPITAL_COMMUNITY): Payer: Self-pay | Admitting: *Deleted

## 2019-04-02 DIAGNOSIS — Y929 Unspecified place or not applicable: Secondary | ICD-10-CM | POA: Diagnosis not present

## 2019-04-02 DIAGNOSIS — S32512A Fracture of superior rim of left pubis, initial encounter for closed fracture: Secondary | ICD-10-CM | POA: Diagnosis not present

## 2019-04-02 DIAGNOSIS — E1122 Type 2 diabetes mellitus with diabetic chronic kidney disease: Secondary | ICD-10-CM | POA: Diagnosis not present

## 2019-04-02 DIAGNOSIS — I129 Hypertensive chronic kidney disease with stage 1 through stage 4 chronic kidney disease, or unspecified chronic kidney disease: Secondary | ICD-10-CM | POA: Insufficient documentation

## 2019-04-02 DIAGNOSIS — E119 Type 2 diabetes mellitus without complications: Secondary | ICD-10-CM | POA: Insufficient documentation

## 2019-04-02 DIAGNOSIS — Z7901 Long term (current) use of anticoagulants: Secondary | ICD-10-CM | POA: Diagnosis not present

## 2019-04-02 DIAGNOSIS — Z96651 Presence of right artificial knee joint: Secondary | ICD-10-CM | POA: Diagnosis not present

## 2019-04-02 DIAGNOSIS — Z79899 Other long term (current) drug therapy: Secondary | ICD-10-CM | POA: Diagnosis not present

## 2019-04-02 DIAGNOSIS — S79912A Unspecified injury of left hip, initial encounter: Secondary | ICD-10-CM | POA: Diagnosis present

## 2019-04-02 DIAGNOSIS — Y939 Activity, unspecified: Secondary | ICD-10-CM | POA: Insufficient documentation

## 2019-04-02 DIAGNOSIS — W050XXA Fall from non-moving wheelchair, initial encounter: Secondary | ICD-10-CM | POA: Insufficient documentation

## 2019-04-02 DIAGNOSIS — Y999 Unspecified external cause status: Secondary | ICD-10-CM | POA: Insufficient documentation

## 2019-04-02 DIAGNOSIS — N183 Chronic kidney disease, stage 3 (moderate): Secondary | ICD-10-CM | POA: Insufficient documentation

## 2019-04-02 LAB — COMPREHENSIVE METABOLIC PANEL
ALT: 21 U/L (ref 0–44)
AST: 19 U/L (ref 15–41)
Albumin: 3.6 g/dL (ref 3.5–5.0)
Alkaline Phosphatase: 50 U/L (ref 38–126)
Anion gap: 8 (ref 5–15)
BUN: 41 mg/dL — ABNORMAL HIGH (ref 8–23)
CO2: 23 mmol/L (ref 22–32)
Calcium: 9.1 mg/dL (ref 8.9–10.3)
Chloride: 104 mmol/L (ref 98–111)
Creatinine, Ser: 1.34 mg/dL — ABNORMAL HIGH (ref 0.44–1.00)
GFR calc Af Amer: 42 mL/min — ABNORMAL LOW (ref >60)
GFR calc non Af Amer: 36 mL/min — ABNORMAL LOW (ref >60)
Glucose, Bld: 371 mg/dL — ABNORMAL HIGH (ref 70–99)
Potassium: 4.6 mmol/L (ref 3.5–5.1)
Sodium: 135 mmol/L (ref 135–145)
Total Bilirubin: 1.3 mg/dL — ABNORMAL HIGH (ref 0.3–1.2)
Total Protein: 6.2 g/dL — ABNORMAL LOW (ref 6.5–8.1)

## 2019-04-02 LAB — CBC WITH DIFFERENTIAL/PLATELET
Abs Immature Granulocytes: 0.12 10*3/uL — ABNORMAL HIGH (ref 0.00–0.07)
Basophils Absolute: 0.1 10*3/uL (ref 0.0–0.1)
Basophils Relative: 0 %
Eosinophils Absolute: 0 10*3/uL (ref 0.0–0.5)
Eosinophils Relative: 0 %
HCT: 41.7 % (ref 36.0–46.0)
Hemoglobin: 13.5 g/dL (ref 12.0–15.0)
Immature Granulocytes: 1 %
Lymphocytes Relative: 6 %
Lymphs Abs: 0.7 10*3/uL (ref 0.7–4.0)
MCH: 28.8 pg (ref 26.0–34.0)
MCHC: 32.4 g/dL (ref 30.0–36.0)
MCV: 89.1 fL (ref 80.0–100.0)
Monocytes Absolute: 0.6 10*3/uL (ref 0.1–1.0)
Monocytes Relative: 5 %
Neutro Abs: 10.1 10*3/uL — ABNORMAL HIGH (ref 1.7–7.7)
Neutrophils Relative %: 88 %
Platelets: 107 10*3/uL — ABNORMAL LOW (ref 150–400)
RBC: 4.68 MIL/uL (ref 3.87–5.11)
RDW: 13.9 % (ref 11.5–15.5)
WBC: 11.6 10*3/uL — ABNORMAL HIGH (ref 4.0–10.5)
nRBC: 0 % (ref 0.0–0.2)

## 2019-04-02 MED ORDER — HYDROCODONE-ACETAMINOPHEN 5-325 MG PO TABS: 1.0000 | ORAL_TABLET | ORAL | 0 refills | Status: DC | PRN

## 2019-04-02 NOTE — ED Provider Notes (Signed)
[facility] EMERGENCY DEPARTMENT Provider Note   CSN: [id number] Arrival date & time: 04/02/19  1139    History   Chief Complaint Chief Complaint  Patient presents with  . Hip Pain    HPI Linda Hess is a 85 y.o. female.     The history is provided by the patient. No language interpreter was used.  Hip Pain  This is a new problem. The current episode started 1 to 2 hours ago. The problem occurs constantly. The problem has not changed since onset.Nothing aggravates the symptoms. Nothing relieves the symptoms. She has tried nothing for the symptoms. The treatment provided no relief.   Pt complains of pain in left hip after falling.  Pt reports she is in a wheel chair.  Pt was able to transfer today.  Pt reports she slid out of wheel chair yesterday.  Past Medical History:  Diagnosis Date  . CIDP (chronic inflammatory demyelinating polyneuropathy) (HCC)   . Essential hypertension, benign    pt denies  . History of DVT (deep vein thrombosis)    Reportedly left leg, details not clear  . Mixed hyperlipidemia   . Type 2 diabetes mellitus [facility])     Patient Active Problem List   Diagnosis Date Noted  . Cellulitis of right upper extremity 12/14/2018  . Peripheral arterial disease (HCC) 03/14/2014  . CIDP (chronic inflammatory demyelinating polyneuropathy) (HCC) 03/14/2014  . Mixed hyperlipidemia 03/12/2014  . Essential hypertension, benign 03/12/2014  . Uncontrolled type 2 diabetes mellitus with stage 3 chronic kidney disease (HCC) 03/12/2014    Past Surgical History:  Procedure Laterality Date  . CHOLECYSTECTOMY    . Left quadriceps muscle biopsy  March 2010  . Right knee replacement  September 2014     OB History    Gravida  4   Para  4   Term  4   Preterm      AB      Living        SAB      TAB      Ectopic      Multiple      Live Births               Home Medications    Prior to Admission medications   Medication Sig Start Date End  Date Taking? Authorizing Provider  acetaminophen (TYLENOL) 325 MG tablet Take 325 mg by mouth every 6 (six) hours as needed for mild pain.    [provider]  calcium-vitamin D (OSCAL WITH D) 500-200 MG-UNIT per tablet Take 1 tablet by mouth at bedtime.  03/14/14   Jonelle Sidle, MD  ferrous sulfate 325 (65 FE) MG tablet Take 325 mg by mouth every morning.  03/14/14   [provider]  folic acid (FOLVITE) 1 MG tablet Take 1 mg by mouth every morning.     [provider]  gabapentin (NEURONTIN) 100 MG capsule Take 100 mg by mouth 3 (three) times daily.  08/30/18   [provider]  JANUVIA 25 MG tablet TAKE (1) TABLET BY MOUTH DAILY FOR DIABETES Patient taking differently: Take 25 mg by mouth every morning.  07/30/16   Roma Kayser, MD  Omega-3 Fatty Acids (FISH OIL) 1200 MG CAPS Take 1 capsule by mouth every morning.     [provider]  predniSONE (DELTASONE) 20 MG tablet Take 20 mg by mouth daily.     [provider]  rivaroxaban (XARELTO) 20 MG TABS tablet Take  20 mg by mouth every morning.    [provider]  simvastatin (ZOCOR) 10 MG tablet Take 10 mg by mouth at bedtime.     [provider]  traMADol (ULTRAM) 50 MG tablet Take 50 mg by mouth every 6 (six) hours as needed for moderate pain.     [provider]  vitamin C (ASCORBIC ACID) 500 MG tablet Take 500 mg by mouth daily.    [provider]    Family History Family History  Problem Relation Age of Onset  . Heart attack Father   . Stroke Mother   . Cancer Brother     Social History Social History   Tobacco Use  . Smoking status: Never Smoker  . Smokeless tobacco: Never Used  Substance Use Topics  . Alcohol use: No  . Drug use: No     Allergies   Amoxicillin-pot clavulanate; Other; and Penicillins   Review of Systems Review of Systems  Constitutional: Negative for fever.  Musculoskeletal: Positive for arthralgias and  myalgias.  All other systems reviewed and are negative.    Physical Exam Updated Vital Signs BP (!) 175/93   Pulse 80   Temp 98.3 F (36.8 C) (Oral)   Resp (!) 22   Ht 5\' 6"  (1.676 m)   Wt 71.7 kg   SpO2 95%   BMI 25.50 kg/m   Physical Exam Vitals signs reviewed.  Constitutional:      Appearance: Normal appearance.  HENT:     Right Ear: Tympanic membrane normal.     Left Ear: Tympanic membrane normal.     Nose: Nose normal.     Mouth/Throat:     Mouth: Mucous membranes are moist.  Eyes:     Pupils: Pupils are equal, round, and reactive to light.  Neck:     Musculoskeletal: Normal range of motion.  Cardiovascular:     Rate and Rhythm: Normal rate.  Pulmonary:     Effort: Pulmonary effort is normal.  Abdominal:     General: Abdomen is flat.  Musculoskeletal:        General: Tenderness present.     Comments: Tender left hip, pain with moving,    Skin:    General: Skin is warm.  Neurological:     General: No focal deficit present.     Mental Status: She is alert.  Psychiatric:        Mood and Affect: Mood normal.      ED Treatments / Results  Labs (all labs ordered are listed, but only abnormal results are displayed) Labs Reviewed  CBC WITH DIFFERENTIAL/PLATELET - Abnormal; Notable for the following components:      Result Value   WBC 11.6 (*)    Platelets 107 (*)    Neutro Abs 10.1 (*)    Abs Immature Granulocytes 0.12 (*)    All other components within normal limits  COMPREHENSIVE METABOLIC PANEL - Abnormal; Notable for the following components:   Glucose, Bld 371 (*)    BUN 41 (*)    Creatinine, Ser 1.34 (*)    Total Protein 6.2 (*)    Total Bilirubin 1.3 (*)    GFR calc non Af Amer 36 (*)    GFR calc Af Amer 42 (*)    All other components within normal limits    EKG EKG Interpretation  Date/Time:  Sunday April 02 2019 11:50:11 EDT Ventricular Rate:  76 PR Interval:    QRS Duration: 89 QT Interval:  368 QTC  Calculation: 414 R Axis:    -9 Text Interpretation:  Sinus rhythm Left ventricular hypertrophy ST changes similar to prior. No STEMI.  Confirmed by Alona Bene [phone]) on 04/02/2019 11:59:36 AM   Radiology No results found.  Procedures Procedures (including critical care time)  Medications Ordered in ED Medications - No data to display   Initial Impression / Assessment and Plan / ED Course  I have reviewed the triage vital signs and the nursing notes.  Pertinent labs & imaging results that were available during my care of the patient were reviewed by me and considered in my medical decision making (see chart for details).        MDM  Pt has superior and inferior pelvic ramus fracture. Pt reports she has tramadol at home.  Pt reports she has son's who help her.  Pt prefers to not be hospitalized.    Final Clinical Impressions(s) / ED Diagnoses   Final diagnoses:  Closed fracture of superior ramus of left pubis, initial encounter [facility])    ED Discharge Orders    None    An After Visit Summary was printed and given to the patient.    Elson Areas, [place] 04/02/19 1309    Maia Plan, MD 04/02/19 1429

## 2019-04-02 NOTE — ED Triage Notes (Signed)
Pt with c/ol left hip pain since fall yesterday.  Denies hitting her head with fall.

## 2019-04-02 NOTE — Discharge Instructions (Signed)
Take your medications.  See your Physician for recheck. Return if any problems.

## 2019-04-02 NOTE — ED Notes (Addendum)
Pedal pulses heard via doppler.  RLE Pedal pulse: 54 by doppler.   LLE Pedal pulse: 56 by doppler.

## 2019-04-07 ENCOUNTER — Emergency Department (HOSPITAL_BASED_OUTPATIENT_CLINIC_OR_DEPARTMENT_OTHER): Payer: Medicare Other

## 2019-04-07 ENCOUNTER — Other Ambulatory Visit: Payer: Self-pay

## 2019-04-07 ENCOUNTER — Encounter (HOSPITAL_COMMUNITY): Payer: Self-pay | Admitting: Emergency Medicine

## 2019-04-07 ENCOUNTER — Emergency Department (HOSPITAL_COMMUNITY)
Admission: EM | Admit: 2019-04-07 | Discharge: 2019-04-07 | Disposition: A | Discharge status: Home / Self Care | Payer: Medicare Other | Attending: Emergency Medicine | Admitting: Emergency Medicine

## 2019-04-07 DIAGNOSIS — Z79899 Other long term (current) drug therapy: Secondary | ICD-10-CM | POA: Diagnosis not present

## 2019-04-07 DIAGNOSIS — I129 Hypertensive chronic kidney disease with stage 1 through stage 4 chronic kidney disease, or unspecified chronic kidney disease: Secondary | ICD-10-CM | POA: Diagnosis not present

## 2019-04-07 DIAGNOSIS — Z7901 Long term (current) use of anticoagulants: Secondary | ICD-10-CM | POA: Diagnosis not present

## 2019-04-07 DIAGNOSIS — R600 Localized edema: Secondary | ICD-10-CM | POA: Diagnosis present

## 2019-04-07 DIAGNOSIS — R609 Edema, unspecified: Secondary | ICD-10-CM

## 2019-04-07 DIAGNOSIS — M7989 Other specified soft tissue disorders: Secondary | ICD-10-CM

## 2019-04-07 DIAGNOSIS — N183 Chronic kidney disease, stage 3 (moderate): Secondary | ICD-10-CM | POA: Diagnosis not present

## 2019-04-07 DIAGNOSIS — E1122 Type 2 diabetes mellitus with diabetic chronic kidney disease: Secondary | ICD-10-CM | POA: Diagnosis not present

## 2019-04-07 DIAGNOSIS — Z86718 Personal history of other venous thrombosis and embolism: Secondary | ICD-10-CM | POA: Diagnosis not present

## 2019-04-07 NOTE — ED Triage Notes (Signed)
Pt was sent here her home health nurse for possible blood clots in her legs bilaterally. Pt also endorses a recent fall with pelvic fx.

## 2019-04-07 NOTE — Progress Notes (Signed)
Lower extremity venous has been completed.   Preliminary results in CV Proc.   Blanch Media 04/07/2019 3:24 PM

## 2019-04-07 NOTE — Discharge Instructions (Signed)
Your ultrasound was negative for blood clots. I think the swelling you are having is related to you recent pelvic fracture and sitting with your legs down for extended periods of time. Try to keep them elevated although I understand this may be difficult because of pain. Just do the best you can.

## 2019-04-07 NOTE — ED Provider Notes (Signed)
[facility]   CSN: [id number] Arrival date & time: 04/07/19  1256    History   Chief Complaint Chief Complaint  Patient presents with  . Possible DVT    HPI Linda Hess is a 85 y.o. female.     HPI   87yF with LE edema. B/l. Recently diagnosed pelvic fracture and noticed swelling worsening shortly after. Home health nurse came today and thought she should be evaluated for this. No acute complaints otherwise. No CP or dyspnea. Hx of DVT. She is on xarelto and reports compliance.   Past Medical History:  Diagnosis Date  . CIDP (chronic inflammatory demyelinating polyneuropathy) (HCC)   . Essential hypertension, benign    pt denies  . History of DVT (deep vein thrombosis)    Reportedly left leg, details not clear  . Mixed hyperlipidemia   . Type 2 diabetes mellitus [facility](HCC)     Patient Active Problem List   Diagnosis Date Noted  . Cellulitis of right upper extremity 12/14/2018  . Peripheral arterial disease (HCC) 03/14/2014  . CIDP (chronic inflammatory demyelinating polyneuropathy) (HCC) 03/14/2014  . Mixed hyperlipidemia 03/12/2014  . Essential hypertension, benign 03/12/2014  . Uncontrolled type 2 diabetes mellitus with stage 3 chronic kidney disease (HCC) 03/12/2014    Past Surgical History:  Procedure Laterality Date  . CHOLECYSTECTOMY    . Left quadriceps muscle biopsy  March 2010  . Right knee replacement  September 2014     OB History    Gravida  4   Para  4   Term  4   Preterm      AB      Living        SAB      TAB      Ectopic      Multiple      Live Births             Home Medications    Prior to Admission medications   Medication Sig Start Date End Date Taking? Authorizing Provider  acetaminophen (TYLENOL) 325 MG tablet Take 325 mg by mouth every 6 (six) hours as needed for mild pain.   Yes [provider]  calcium-vitamin D (OSCAL WITH D) 500-200 MG-UNIT per  tablet Take 1 tablet by mouth at bedtime.  03/14/14  Yes Jonelle SidleMcDowell, Samuel G, MD  ferrous sulfate 325 (65 FE) MG tablet Take 325 mg by mouth every morning.  03/14/14  Yes [provider]  folic acid (FOLVITE) 1 MG tablet Take 1 mg by mouth every morning.    Yes [provider]  gabapentin (NEURONTIN) 100 MG capsule Take 100 mg by mouth 3 (three) times daily.  08/30/18  Yes [provider]  HYDROcodone-acetaminophen (NORCO/VICODIN) 5-325 MG tablet Take 1 tablet by mouth every 4 (four) hours as needed. 04/02/19  Yes Elson AreasSofia, Leslie K, PA-C  JANUVIA 50 MG tablet Take 50 mg by mouth daily. 03/30/19  Yes [provider]  Omega-3 Fatty Acids (FISH OIL) 1200 MG CAPS Take 1 capsule by mouth every morning.    Yes [provider]  predniSONE (DELTASONE) 10 MG tablet Take 10 mg by mouth daily.    Yes [provider]  rivaroxaban (XARELTO) 20 MG TABS tablet Take 20 mg by mouth every morning.   Yes [provider]  rosuvastatin (CRESTOR) 10 MG tablet Take 10 mg by mouth daily. 03/30/19  Yes [provider]  traMADol (ULTRAM) 50 MG tablet Take  50 mg by mouth every 6 (six) hours as needed for moderate pain.    Yes [provider]  vitamin C (ASCORBIC ACID) 500 MG tablet Take 500 mg by mouth daily.   Yes [provider]   Family History Family History  Problem Relation Age of Onset  . Heart attack Father   . Stroke Mother   . Cancer Brother    Social History Social History   Tobacco Use  . Smoking status: Never Smoker  . Smokeless tobacco: Never Used  Substance Use Topics  . Alcohol use: No  . Drug use: No   Allergies   Amoxicillin-pot clavulanate; Other; and Penicillins  Review of Systems Review of Systems  All systems reviewed and negative, other than as noted in HPI.  Physical Exam Updated Vital Signs BP (!) 177/71 (BP Location: Right Arm)   Pulse 65   Temp 97.7 F (36.5 C) (Oral)   Resp 18   Ht  (1.676  m)   Wt 71.7 kg   LMP  (Exact Date)   SpO2 97%   BMI 25.50 kg/m   Physical Exam Vitals signs and nursing note reviewed.  Constitutional:      General: She is not in acute distress.    Appearance: She is well-developed.  HENT:     Head: Normocephalic and atraumatic.  Eyes:     General:        Right eye: No discharge.        Left eye: No discharge.     Conjunctiva/sclera: Conjunctivae normal.  Neck:     Musculoskeletal: Neck supple.  Cardiovascular:     Rate and Rhythm: Normal rate and regular rhythm.     Heart sounds: Normal heart sounds. No murmur. No friction rub. No gallop.   Pulmonary:     Effort: Pulmonary effort is normal. No respiratory distress.     Breath sounds: Normal breath sounds.  Abdominal:     General: There is no distension.     Palpations: Abdomen is soft.     Tenderness: There is no abdominal tenderness.  Musculoskeletal:        General: No tenderness.     Right lower leg: No edema.     Left lower leg: No edema.  Skin:    General: Skin is warm and dry.  Neurological:     Mental Status: She is alert.  Psychiatric:        Behavior: Behavior normal.        Thought Content: Thought content normal.    ED Treatments / Results  Labs (all labs ordered are listed, but only abnormal results are displayed) Labs Reviewed - No data to display  EKG None  Radiology No results found.  Procedures Procedures (including critical care time)  Medications Ordered in ED Medications - No data to display   Initial Impression / Assessment and Plan / ED Course  I have reviewed the triage vital signs and the nursing notes.  Pertinent labs & imaging results that were available during my care of the patient were reviewed by me and considered in my medical decision making (see chart for details).   87yF with symmetric pitting LE edema. Recent pelvic fracture. She has had poor mobility and says she has been spending most of the time sitting with her legs down. I  suspect the swelling it from this. Consider DVT although I feel less likely, particularly with xarelto usage and reports compliance. No leg pain or other skin  changes to suggest acute arterial insufficiency, infectious, etc. Will obtain US to eval for possible DVT. If negative, plan DC and try to to keep her legs elevated the best she comfortably can.   Final Clinical Impressions(s) / ED Diagnoses   Final diagnoses:  Leg swelling    ED Discharge Orders    None       Raeford Razor, MD 04/09/19 1251

## 2019-04-07 NOTE — ED Notes (Signed)
Patient verbalizes understanding of discharge instructions. Opportunity for questioning and answers were provided. Armband removed by staff, pt discharged from ED.  

## 2019-04-25 ENCOUNTER — Other Ambulatory Visit: Payer: Self-pay | Admitting: Orthopedic Surgery

## 2019-04-25 DIAGNOSIS — W19XXXA Unspecified fall, initial encounter: Secondary | ICD-10-CM

## 2019-04-26 ENCOUNTER — Ambulatory Visit
Admission: RE | Admit: 2019-04-26 | Discharge: 2019-04-26 | Disposition: A | Discharge status: Home / Self Care | Payer: Medicare Other | Source: Ambulatory Visit | Attending: Orthopedic Surgery | Admitting: Orthopedic Surgery

## 2019-04-26 ENCOUNTER — Other Ambulatory Visit: Payer: Self-pay

## 2019-04-26 DIAGNOSIS — M1612 Unilateral primary osteoarthritis, left hip: Secondary | ICD-10-CM | POA: Diagnosis not present

## 2019-04-26 DIAGNOSIS — M7062 Trochanteric bursitis, left hip: Secondary | ICD-10-CM | POA: Diagnosis not present

## 2019-04-26 DIAGNOSIS — W19XXXA Unspecified fall, initial encounter: Secondary | ICD-10-CM | POA: Diagnosis not present

## 2019-04-26 DIAGNOSIS — M25552 Pain in left hip: Secondary | ICD-10-CM | POA: Diagnosis not present

## 2019-04-26 DIAGNOSIS — R609 Edema, unspecified: Secondary | ICD-10-CM | POA: Insufficient documentation

## 2019-04-26 DIAGNOSIS — S32592A Other specified fracture of left pubis, initial encounter for closed fracture: Secondary | ICD-10-CM | POA: Insufficient documentation

## 2020-03-13 ENCOUNTER — Encounter: Payer: Self-pay | Admitting: "Endocrinology

## 2020-03-13 LAB — BASIC METABOLIC PANEL
BUN: 34 — AB (ref 4–21)
Creatinine: 1.1 (ref 0.5–1.1)

## 2020-03-13 LAB — HEMOGLOBIN A1C: Hemoglobin A1C: 12.2

## 2020-04-15 ENCOUNTER — Encounter: Payer: Self-pay | Admitting: "Endocrinology

## 2020-04-15 ENCOUNTER — Other Ambulatory Visit: Payer: Self-pay

## 2020-04-15 ENCOUNTER — Ambulatory Visit: Payer: Medicare Other | Admitting: "Endocrinology

## 2020-04-15 VITALS — BP 160/85 | HR 72 | Ht 66.0 in | Wt 148.4 lb

## 2020-04-15 DIAGNOSIS — E1122 Type 2 diabetes mellitus with diabetic chronic kidney disease: Secondary | ICD-10-CM | POA: Diagnosis not present

## 2020-04-15 DIAGNOSIS — I10 Essential (primary) hypertension: Secondary | ICD-10-CM | POA: Diagnosis not present

## 2020-04-15 DIAGNOSIS — E782 Mixed hyperlipidemia: Secondary | ICD-10-CM

## 2020-04-15 DIAGNOSIS — E1165 Type 2 diabetes mellitus with hyperglycemia: Secondary | ICD-10-CM | POA: Diagnosis not present

## 2020-04-15 DIAGNOSIS — N183 Chronic kidney disease, stage 3 unspecified: Secondary | ICD-10-CM

## 2020-04-15 DIAGNOSIS — IMO0002 Reserved for concepts with insufficient information to code with codable children: Secondary | ICD-10-CM

## 2020-04-15 MED ORDER — GLIPIZIDE ER 5 MG PO TB24: 5.0000 mg | ORAL_TABLET | Freq: Every day | ORAL | 3 refills | Status: DC

## 2020-04-15 NOTE — Progress Notes (Signed)
Endocrinology Consult Note       04/15/2020, 6:20 PM   Subjective:    Patient ID: Linda Hess, female    DOB: 1931/08/14.  Linda Hess is being seen in consultation for management of currently uncontrolled symptomatic diabetes requested by  [facility].   Past Medical History:  Diagnosis Date  . CIDP (chronic inflammatory demyelinating polyneuropathy) ([place])   . Essential hypertension, benign    pt denies  . History of DVT (deep vein thrombosis)    Reportedly left leg, details not clear  . Mixed hyperlipidemia   . Type 2 diabetes mellitus ([place])     Past Surgical History:  Procedure Laterality Date  . CHOLECYSTECTOMY    . Left quadriceps muscle biopsy  March 2010  . Right knee replacement  September 2014    Social History   Socioeconomic History  . Marital status: Widowed    Spouse name: Not on file  . Number of children: Not on file  . Years of education: Not on file  . Highest education level: Not on file  Occupational History  . Not on file  Tobacco Use  . Smoking status: Never Smoker  . Smokeless tobacco: Never Used  Substance and Sexual Activity  . Alcohol use: No  . Drug use: No  . Sexual activity: Not Currently  Other Topics Concern  . Not on file  Social History Narrative  . Not on file   Social Determinants of Health   Financial Resource Strain:   . Difficulty of Paying Living Expenses:   Food Insecurity:   . Worried About Charity fundraiser in the Last Year:   . Arboriculturist in the Last Year:   Transportation Needs:   . Film/video editor (Medical):   Marland Kitchen Lack of Transportation (Non-Medical):   Physical Activity:   . Days of Exercise per Week:   . Minutes of Exercise per Session:   Stress:   . Feeling of Stress :   Social Connections:   . Frequency of Communication with Friends and Family:   . Frequency of Social  Gatherings with Friends and Family:   . Attends Religious Services:   . Active Member of Clubs or Organizations:   . Attends Archivist Meetings:   Marland Kitchen Marital Status:     Family History  Problem Relation Age of Onset  . Heart attack Father   . Stroke Mother   . Cancer Brother     Outpatient Encounter Medications as of 04/15/2020  Medication Sig  . acetaminophen (TYLENOL) 325 MG tablet Take 325 mg by mouth every 6 (six) hours as needed for mild pain.  . calcium-vitamin D (OSCAL WITH D) 500-200 MG-UNIT per tablet Take 1 tablet by mouth at bedtime.   . folic acid (FOLVITE) 1 MG tablet Take 1 mg by mouth every morning.   . gabapentin (NEURONTIN) 100 MG capsule Take 100 mg by mouth 3 (three) times daily.   Marland Kitchen glipiZIDE (GLUCOTROL XL) 5 MG 24 hr tablet Take 1 tablet (5 mg total) by mouth daily with breakfast.  . JANUVIA 100  MG tablet Take 100 mg by mouth daily.  . Omega-3 Fatty Acids (FISH OIL) 1200 MG CAPS Take 1 capsule by mouth every morning.   . predniSONE (DELTASONE) 10 MG tablet Take 10 mg by mouth daily.   . rivaroxaban (XARELTO) 20 MG TABS tablet Take 20 mg by mouth every morning.  . rosuvastatin (CRESTOR) 10 MG tablet Take 10 mg by mouth daily.  . traMADol (ULTRAM) 50 MG tablet Take 50 mg by mouth every 6 (six) hours as needed for moderate pain.   . vitamin C (ASCORBIC ACID) 500 MG tablet Take 500 mg by mouth daily.  . [DISCONTINUED] ferrous sulfate 325 (65 FE) MG tablet Take 325 mg by mouth every morning.   . [DISCONTINUED] HYDROcodone-acetaminophen (NORCO/VICODIN) 5-325 MG tablet Take 1 tablet by mouth every 4 (four) hours as needed.  . [DISCONTINUED] JANUVIA 50 MG tablet Take 50 mg by mouth daily.   No facility-administered encounter medications on file as of 04/15/2020.    ALLERGIES: Allergies  Allergen Reactions  . Amoxicillin-Pot Clavulanate Hives  . Other Hives and Itching    RED MEAT  . Penicillins     hives    VACCINATION STATUS:  There is no  immunization history on file for this patient.  Diabetes She presents for her initial diabetic visit. She has type 2 diabetes mellitus. Onset time: She was diagnosed with type 2 diabetes at approximately age of 58 years. Her disease course has been worsening (She was previously seen in this clinic with reasonable control of diabetes.  She did not return for follow-up.  She is being referred with higher A1c of 12.2%.). There are no hypoglycemic associated symptoms. Pertinent negatives for hypoglycemia include no confusion, headaches, pallor or seizures. Associated symptoms include blurred vision, fatigue, polydipsia and polyuria. Pertinent negatives for diabetes include no chest pain and no polyphagia. There are no hypoglycemic complications. Symptoms are worsening. Diabetic complications include nephropathy. Risk factors for coronary artery disease include dyslipidemia, diabetes mellitus, tobacco exposure, sedentary lifestyle and post-menopausal. Current diabetic treatments: She is on Januvia 100 mg daily. Her weight is decreasing steadily. She is following a generally unhealthy diet. When asked about meal planning, she reported none. She has not had a previous visit with a dietitian. She never participates in exercise. Her home blood glucose trend is increasing steadily. (She did not bring any logs nor meter to review with her.  She is accompanied by her son, who is offering to help.) An ACE inhibitor/angiotensin II receptor blocker is not being taken. Eye exam is current.  Hyperlipidemia This is a chronic problem. The current episode started more than 1 year ago. The problem is uncontrolled. Exacerbating diseases include diabetes. Pertinent negatives include no chest pain or shortness of breath. Current antihyperlipidemic treatment includes statins. Risk factors for coronary artery disease include dyslipidemia, diabetes mellitus, a sedentary lifestyle and post-menopausal.     Review of Systems   Constitutional: Positive for fatigue. Negative for chills, fever and unexpected weight change.  HENT: Negative for trouble swallowing and voice change.   Eyes: Positive for blurred vision. Negative for visual disturbance.  Respiratory: Negative for cough, shortness of breath and wheezing.   Cardiovascular: Negative for chest pain, palpitations and leg swelling.  Gastrointestinal: Negative for diarrhea, nausea and vomiting.  Endocrine: Positive for polydipsia and polyuria. Negative for cold intolerance, heat intolerance and polyphagia.  Musculoskeletal: Positive for arthralgias and gait problem.  Skin: Negative for color change, pallor, rash and wound.  Neurological: Negative for seizures and headaches.  Psychiatric/Behavioral: Negative for confusion and suicidal ideas.    Objective:    Vitals with BMI 04/15/2020 04/07/2019 04/07/2019  Height 5\' 6"  - -  Weight 148 lbs 6 oz - -  BMI 23.96 - -  Systolic 160 142  Diastolic 85 80 63  Pulse 72 66 63    BP (!) 160/85   Pulse 72   Ht 5\' 6"  (1.676 m)   Wt 148 lb 6.4 oz (67.3 kg)   BMI 23.95 kg/m   Wt Readings from Last 3 Encounters:  04/15/20 148 lb 6.4 oz (67.3 kg)  04/07/19 158 lb (71.7 kg)  04/02/19 158 lb (71.7 kg)     Physical Exam Constitutional:      Appearance: She is well-developed.  HENT:     Head: Normocephalic and atraumatic.  Neck:     Thyroid: No thyromegaly.     Trachea: No tracheal deviation.  Cardiovascular:     Rate and Rhythm: Normal rate.  Pulmonary:     Effort: Pulmonary effort is normal.  Abdominal:     Tenderness: There is no abdominal tenderness. There is no guarding.  Musculoskeletal:        General: Normal range of motion.     Cervical back: Normal range of motion and neck supple.     Comments: Ambulates with a walker.  Skin:    General: Skin is warm and dry.     Coloration: Skin is not pale.     Findings: No erythema or rash.  Neurological:     Mental Status: She is alert and oriented to  person, place, and time.     Cranial Nerves: No cranial nerve deficit.     Coordination: Coordination normal.     Deep Tendon Reflexes: Reflexes are normal and symmetric.  Psychiatric:        Judgment: Judgment normal.       CMP ( most recent) CMP     Component Value Date/Time   NA 135 04/02/2019 1224   NA 139 08/30/2013 0508   K 4.6 04/02/2019 1224   K 4.3 08/31/2013 0443   CL 104 04/02/2019 1224   CL 107 08/30/2013 0508   CO2 23 04/02/2019 1224   CO2 26 08/30/2013 0508   GLUCOSE 371 (H) 04/02/2019 1224   GLUCOSE 164 (H) 08/30/2013 0508   BUN 34 (A) 03/13/2020 0000   BUN 27 (H) 08/30/2013 0508   CREATININE 1.1 03/13/2020 0000   CREATININE 1.34 (H) 04/02/2019 1224   CREATININE 1.34 (H) 04/28/2016 0925   CALCIUM 9.1 04/02/2019 1224   CALCIUM 9.1 08/30/2013 0508   PROT 6.2 (L) 04/02/2019 1224   ALBUMIN 3.6 04/02/2019 1224   AST 19 04/02/2019 1224   ALT 21 04/02/2019 1224   ALKPHOS 50 04/02/2019 1224   BILITOT 1.3 (H) 04/02/2019 1224   GFRNONAA 36 (L) 04/02/2019 1224   GFRNONAA 52 (L) 08/30/2013 0508   GFRAA 42 (L) 04/02/2019 1224   GFRAA >60 08/30/2013 0508     Diabetic Labs (most recent): Lab Results  Component Value Date   HGBA1C 12.2 03/13/2020   HGBA1C 7.3 (H) 04/28/2016   HGBA1C 8.2 01/28/2016     Lipid Panel ( most recent) Lipid Panel     Component Value Date/Time   CHOL 148 04/28/2016 0925   TRIG 178 (H) 04/28/2016 0925   HDL 63 04/28/2016 0925   CHOLHDL 2.3 04/28/2016 0925   VLDL 36 (H) 04/28/2016 0925   LDLCALC 49 04/28/2016 06/28/2016  Lab Results  Component Value Date   TSH 1.41 04/28/2016   FREET4 1.2 04/28/2016      Assessment & Plan:   1. Uncontrolled type 2 diabetes mellitus with stage 3 chronic kidney disease (HCC)  - Linda Hess has currently uncontrolled symptomatic type 2 DM since  86 years of age,  with most recent A1c of 12.2 %. Recent labs reviewed. - I had a long discussion with her about the progressive nature of  diabetes and the pathology behind its complications. She was previously seen in this clinic with reasonable control of diabetes with A1c of 7.3%.  She did not return for follow-up.  She is being referred again with higher A1c of 12.2%.  -her diabetes is complicated by CKD and she remains at a high risk for more acute and chronic complications which include CAD, CVA, CKD, retinopathy, and neuropathy. These are all discussed in detail with her.  - I have counseled her on diet management  by adopting a carbohydrate restricted/protein rich diet. Patient is encouraged to switch to  unprocessed or minimally processed     complex starch and increased protein intake (animal or plant source), fruits, and vegetables. -  she is advised to stick to a routine mealtimes to eat 3 meals  a day and avoid unnecessary snacks ( to snack only to correct hypoglycemia).   - she admits that there is a room for improvement in her food and drink choices. - Suggestion is made for her to avoid simple carbohydrates  from her diet including Cakes, Sweet Desserts, Ice Cream, Soda (diet and regular), Sweet Tea, Candies, Chips, Cookies, Store Bought Juices, Alcohol in Excess of  1-2 drinks a day, Artificial Sweeteners,  Coffee Creamer, and "Sugar-free" Products. This will help patient to have more stable blood glucose profile and potentially avoid unintended weight gain.  - she will be scheduled with Norm Salt, RDN, CDE for diabetes education.  - I have approached her with the following individualized plan to manage  her diabetes and patient agrees:   - she will likely require insulin treatment in order for her to achieve control of diabetes to target.   -In preparation, she is approached to start monitoring blood glucose 4 times a day-before meals and at bedtime and return for visit in 2 weeks with her meter and logs. -The meantime, she is advised to continue Januvia 100 mg daily at breakfast.  I also discussed and added  glipizide 5 mg XL p.o. daily at breakfast.    -Her son accompanies her to clinic is offering help in case she needs to be on insulin.   - she is encouraged to call clinic for blood glucose levels less than 70 or above 300 mg /dl.  - she is not a candidate for Metformin, SGLT2 inhibitors,  due to concurrent renal insufficiency.  - she is not a suitable candidate for incretin therapy either.    - Specific targets for  A1c;  LDL, HDL,  and Triglycerides were discussed with the patient.  2) Blood Pressure /Hypertension:  her blood pressure is not controlled to target.   she is not on any antihypertensive medications.  She will be considered for low-dose losartan with next visit.     3) Lipids/Hyperlipidemia:   Review of her recent lipid panel showed  controlled  LDL at 49 .  she  is advised to continue    Crestor 10 mg daily at bedtime.  Side effects and precautions discussed with her.  4)  Weight/Diet:  Body mass index is 23.95 kg/m.  -   she is not a candidate for weight loss.  She cannot exercise optimally, detailed carbohydrates information provided  -  detailed on discharge instructions.  5) Chronic Care/Health Maintenance:  -she  is on  Statin medications and  is encouraged to initiate and continue to follow up with Ophthalmology, Dentist,  Podiatrist at least yearly or according to recommendations, and advised to   stay away from smoking. I have recommended yearly flu vaccine and pneumonia vaccine at least every 5 years; moderate intensity exercise for up to 150 minutes weekly; and  sleep for at least 7 hours a day.  - she is  advised to [facility] for [facility] care needs, as well as her other providers for optimal and coordinated care.   - Time spent in this patient care: 60 min, of which > 50% was spent in  counseling  her about her currently uncontrolled, complicated type 2 diabetes, hyperlipidemia and the rest reviewing her  blood glucose logs , discussing her hypoglycemia and hyperglycemia episodes, reviewing her current and  previous labs / studies  ( including abstraction from other facilities) and medications  doses and developing a  long term treatment plan based on the latest standards of care/ guidelines; and documenting her care.    Please refer to Patient Instructions for Blood Glucose Monitoring and Insulin/Medications Dosing Guide"  in media tab for additional information. Please  also refer to " Patient Self Inventory" in the Media  tab for reviewed elements of pertinent patient history.  Linda CelestineMargaret P Hess participated in the discussions, expressed understanding, and voiced agreement with the above plans.  All questions were answered to her satisfaction. she is encouraged to contact clinic should she have any questions or concerns prior to her return visit.   Follow up plan: - Return in about 2 weeks (around 04/29/2020), or in office, for Follow up with Meter and Logs Only - no Labs.  Marquis LunchGebre Sigismund Cross, MD [facility] [address] [place], [place] [address] Phone: [phone]  Fax: [phone]    04/15/2020, 6:20 PM  This note was partially dictated with voice recognition software. Similar sounding words can be transcribed inadequately or may not  be corrected upon review.

## 2020-04-16 NOTE — Patient Instructions (Signed)

## 2020-04-30 ENCOUNTER — Other Ambulatory Visit: Payer: Self-pay

## 2020-04-30 ENCOUNTER — Encounter: Payer: Self-pay | Admitting: "Endocrinology

## 2020-04-30 ENCOUNTER — Ambulatory Visit: Payer: Medicare Other | Admitting: "Endocrinology

## 2020-04-30 VITALS — BP 150/91 | HR 84 | Ht 66.0 in | Wt 150.0 lb

## 2020-04-30 DIAGNOSIS — I10 Essential (primary) hypertension: Secondary | ICD-10-CM

## 2020-04-30 DIAGNOSIS — IMO0002 Reserved for concepts with insufficient information to code with codable children: Secondary | ICD-10-CM

## 2020-04-30 DIAGNOSIS — E782 Mixed hyperlipidemia: Secondary | ICD-10-CM

## 2020-04-30 DIAGNOSIS — E1165 Type 2 diabetes mellitus with hyperglycemia: Secondary | ICD-10-CM

## 2020-04-30 DIAGNOSIS — E1122 Type 2 diabetes mellitus with diabetic chronic kidney disease: Secondary | ICD-10-CM

## 2020-04-30 DIAGNOSIS — N183 Chronic kidney disease, stage 3 unspecified: Secondary | ICD-10-CM

## 2020-04-30 NOTE — Progress Notes (Signed)
Endocrinology Consult Note       04/30/2020, 12:45 PM   Subjective:    Patient ID: Linda Hess, female    DOB: Apr 24, 1931.  Linda Hess is being seen in consultation for management of currently uncontrolled symptomatic diabetes requested by  [facility]   Past Medical History:  Diagnosis Date  . CIDP (chronic inflammatory demyelinating polyneuropathy) (HCC)   . Essential hypertension, benign    pt denies  . History of DVT (deep vein thrombosis)    Reportedly left leg, details not clear  . Mixed hyperlipidemia   . Type 2 diabetes mellitus (HCC)     Past Surgical History:  Procedure Laterality Date  . CHOLECYSTECTOMY    . Left quadriceps muscle biopsy  March 2010  . Right knee replacement  September 2014    Social History   Socioeconomic History  . Marital status: Widowed    Spouse name: Not on file  . Number of children: Not on file  . Years of education: Not on file  . Highest education level: Not on file  Occupational History  . Not on file  Tobacco Use  . Smoking status: Never Smoker  . Smokeless tobacco: Never Used  Substance and Sexual Activity  . Alcohol use: No  . Drug use: No  . Sexual activity: Not Currently  Other Topics Concern  . Not on file  Social History Narrative  . Not on file   Social Determinants of Health   Financial Resource Strain:   . Difficulty of Paying Living Expenses:   Food Insecurity:   . Worried About Programme researcher, broadcasting/film/video in the Last Year:   . Barista in the Last Year:   Transportation Needs:   . Freight forwarder (Medical):   Marland Kitchen Lack of Transportation (Non-Medical):   Physical Activity:   . Days of Exercise per Week:   . Minutes of Exercise per Session:   Stress:   . Feeling of Stress :   Social Connections:   . Frequency of Communication with Friends and Family:   . Frequency of Social  Gatherings with Friends and Family:   . Attends Religious Services:   . Active Member of Clubs or Organizations:   . Attends Banker Meetings:   Marland Kitchen Marital Status:     Family History  Problem Relation Age of Onset  . Heart attack Father   . Stroke Mother   . Cancer Brother     Outpatient Encounter Medications as of 04/30/2020  Medication Sig  . acetaminophen (TYLENOL) 325 MG tablet Take 325 mg by mouth every 6 (six) hours as needed for mild pain.  . calcium-vitamin D (OSCAL WITH D) 500-200 MG-UNIT per tablet Take 1 tablet by mouth at bedtime.   . folic acid (FOLVITE) 1 MG tablet Take 1 mg by mouth every morning.   . gabapentin (NEURONTIN) 100 MG capsule Take 100 mg by mouth 3 (three) times daily.   Marland Kitchen glipiZIDE (GLUCOTROL XL) 5 MG 24 hr tablet Take 1 tablet (5 mg total) by mouth daily with breakfast.  . JANUVIA 100  MG tablet Take 100 mg by mouth daily.  . Omega-3 Fatty Acids (FISH OIL) 1200 MG CAPS Take 1 capsule by mouth every morning.   . predniSONE (DELTASONE) 10 MG tablet Take 10 mg by mouth daily.   . rivaroxaban (XARELTO) 20 MG TABS tablet Take 20 mg by mouth every morning.  . rosuvastatin (CRESTOR) 10 MG tablet Take 10 mg by mouth daily.  . traMADol (ULTRAM) 50 MG tablet Take 50 mg by mouth every 6 (six) hours as needed for moderate pain.   . vitamin C (ASCORBIC ACID) 500 MG tablet Take 500 mg by mouth daily.   No facility-administered encounter medications on file as of 04/30/2020.    ALLERGIES: Allergies  Allergen Reactions  . Amoxicillin-Pot Clavulanate Hives  . Other Hives and Itching    RED MEAT  . Penicillins     hives    VACCINATION STATUS:  There is no immunization history on file for this patient.  Diabetes She presents for her follow-up diabetic visit. She has type 2 diabetes mellitus. Onset time: She was diagnosed with type 2 diabetes at approximately age of 63 years. Her disease course has been improving (She was previously seen in this clinic  with reasonable control of diabetes.  She did not return for follow-up.  She is being referred with higher A1c of 12.2%.). There are no hypoglycemic associated symptoms. Pertinent negatives for hypoglycemia include no confusion, headaches, pallor or seizures. Associated symptoms include blurred vision, fatigue, polydipsia and polyuria. Pertinent negatives for diabetes include no chest pain and no polyphagia. There are no hypoglycemic complications. Symptoms are improving. Diabetic complications include nephropathy. Risk factors for coronary artery disease include dyslipidemia, diabetes mellitus, tobacco exposure, sedentary lifestyle and post-menopausal. Current diabetic treatments: She is on Januvia 100 mg daily. Her weight is fluctuating minimally. She is following a generally unhealthy diet. When asked about meal planning, she reported none. She has not had a previous visit with a dietitian. She never participates in exercise. Her home blood glucose trend is decreasing steadily. Her breakfast blood glucose range is generally 140-180 mg/dl. Her lunch blood glucose range is generally 180-200 mg/dl. Her dinner blood glucose range is generally 180-200 mg/dl. Her bedtime blood glucose range is generally 180-200 mg/dl. Her overall blood glucose range is 180-200 mg/dl. (She was put on low-dose glipizide during her last visit.  She is accompanied by her other son.  She is documenting improving glycemic profile, on target at fasting, slightly above target postprandial.   ) An ACE inhibitor/angiotensin II receptor blocker is not being taken. Eye exam is current.  Hyperlipidemia This is a chronic problem. The current episode started more than 1 year ago. The problem is uncontrolled. Exacerbating diseases include diabetes. Pertinent negatives include no chest pain or shortness of breath. Current antihyperlipidemic treatment includes statins. Risk factors for coronary artery disease include dyslipidemia, diabetes mellitus,  a sedentary lifestyle and post-menopausal.     Review of Systems  Constitutional: Positive for fatigue. Negative for chills, fever and unexpected weight change.  HENT: Negative for trouble swallowing and voice change.   Eyes: Positive for blurred vision. Negative for visual disturbance.  Respiratory: Negative for cough, shortness of breath and wheezing.   Cardiovascular: Negative for chest pain, palpitations and leg swelling.  Gastrointestinal: Negative for diarrhea, nausea and vomiting.  Endocrine: Positive for polydipsia and polyuria. Negative for cold intolerance, heat intolerance and polyphagia.  Musculoskeletal: Positive for arthralgias and gait problem.  Skin: Negative for color change, pallor, rash and wound.  Neurological: Negative for seizures and headaches.  Psychiatric/Behavioral: Negative for confusion and suicidal ideas.    Objective:    Vitals with BMI 04/30/2020 04/15/2020 04/07/2019  Height 5\' 6"  5\' 6"  -  Weight 150 lbs 148 lbs 6 oz -  BMI 24.22 23.96 -  Systolic 150 160  Diastolic 91 85 80  Pulse 84 72 66    BP (!) 150/91   Pulse 84   Ht 5\' 6"  (1.676 m)   Wt 150 lb (68 kg)   BMI 24.21 kg/m   Wt Readings from Last 3 Encounters:  04/30/20 150 lb (68 kg)  04/15/20 148 lb 6.4 oz (67.3 kg)  04/07/19 158 lb (71.7 kg)     Physical Exam Constitutional:      Appearance: She is well-developed.  HENT:     Head: Normocephalic and atraumatic.  Neck:     Thyroid: No thyromegaly.     Trachea: No tracheal deviation.  Cardiovascular:     Rate and Rhythm: Normal rate.  Pulmonary:     Effort: Pulmonary effort is normal.  Abdominal:     Tenderness: There is no abdominal tenderness. There is no guarding.  Musculoskeletal:        General: Normal range of motion.     Cervical back: Normal range of motion and neck supple.     Comments: She ambulates with a walker due to diffuse deconditioning from diffuse arthritis  and disequilibrium.    Skin:    General: Skin is  warm and dry.     Coloration: Skin is not pale.     Findings: No erythema or rash.  Neurological:     Mental Status: She is alert and oriented to person, place, and time.     Cranial Nerves: No cranial nerve deficit.     Coordination: Coordination normal.     Deep Tendon Reflexes: Reflexes are normal and symmetric.  Psychiatric:        Judgment: Judgment normal.      CMP ( most recent) CMP     Component Value Date/Time   NA 135 04/02/2019 1224   NA 139 08/30/2013 0508   K 4.6 04/02/2019 1224   K 4.3 08/31/2013 0443   CL 104 04/02/2019 1224   CL 107 08/30/2013 0508   CO2 23 04/02/2019 1224   CO2 26 08/30/2013 0508   GLUCOSE 371 (H) 04/02/2019 1224   GLUCOSE 164 (H) 08/30/2013 0508   BUN 34 (A) 03/13/2020 0000   BUN 27 (H) 08/30/2013 0508   CREATININE 1.1 03/13/2020 0000   CREATININE 1.34 (H) 04/02/2019 1224   CREATININE 1.34 (H) 04/28/2016 0925   CALCIUM 9.1 04/02/2019 1224   CALCIUM 9.1 08/30/2013 0508   PROT 6.2 (L) 04/02/2019 1224   ALBUMIN 3.6 04/02/2019 1224   AST 19 04/02/2019 1224   ALT 21 04/02/2019 1224   ALKPHOS 50 04/02/2019 1224   BILITOT 1.3 (H) 04/02/2019 1224   GFRNONAA 36 (L) 04/02/2019 1224   GFRNONAA 52 (L) 08/30/2013 0508   GFRAA 42 (L) 04/02/2019 1224   GFRAA >60 08/30/2013 0508     Diabetic Labs (most recent): Lab Results  Component Value Date   HGBA1C 12.2 03/13/2020   HGBA1C 7.3 (H) 04/28/2016   HGBA1C 8.2 01/28/2016     Lipid Panel ( most recent) Lipid Panel     Component Value Date/Time   CHOL 148 04/28/2016 0925   TRIG 178 (H) 04/28/2016 0925   HDL 63 04/28/2016 0925   CHOLHDL 2.3  04/28/2016 0925   VLDL 36 (H) 04/28/2016 0925   LDLCALC 49 04/28/2016 0925      Lab Results  Component Value Date   TSH 1.41 04/28/2016   FREET4 1.2 04/28/2016      Assessment & Plan:   1. Uncontrolled type 2 diabetes mellitus with stage 3 chronic kidney disease ([place])  - Linda Hess has currently uncontrolled symptomatic type 2 DM  since  86 years of age.  She was put on low-dose glipizide during her last visit.  She is accompanied by her other son.  She is documenting improving glycemic profile, on target at fasting, slightly above target postprandial.  Her most recent A1c was 12.2%. Recent labs reviewed. - I had a long discussion with her about the progressive nature of diabetes and the pathology behind its complications.   -her diabetes is complicated by CKD and she remains at a high risk for more acute and chronic complications which include CAD, CVA, CKD, retinopathy, and neuropathy. These are all discussed in detail with her.  - I have counseled her on diet management  by adopting a carbohydrate restricted/protein rich diet. Patient is encouraged to switch to  unprocessed or minimally processed     complex starch and increased protein intake (animal or plant source), fruits, and vegetables. -  she is advised to stick to a routine mealtimes to eat 3 meals  a day and avoid unnecessary snacks ( to snack only to correct hypoglycemia).   - - she  admits there is a room for improvement in her diet and drink choices. -  Suggestion is made for her to avoid simple carbohydrates  from her diet including Cakes, Sweet Desserts / Pastries, Ice Cream, Soda (diet and regular), Sweet Tea, Candies, Chips, Cookies, Sweet Pastries,  Store Bought Juices, Alcohol in Excess of  1-2 drinks a day, Artificial Sweeteners, Coffee Creamer, and "Sugar-free" Products. This will help patient to have stable blood glucose profile and potentially avoid unintended weight gain.   - she will be scheduled with Jearld Fenton, RDN, CDE for diabetes education.  - I have approached her with the following individualized plan to manage  her diabetes and patient agrees:   -She has responded to low-dose glipizide.  Based on her presentation with near target postprandial glycemic profile and tight fasting glycemic profile, she will not be considered for insulin  treatment for now.  She wishes to avoid insulin treatment. -In the meantime, she is advised to continue Januvia 100 mg daily at breakfast. -She will benefit from low-dose glipizide.  She is advised to continue glipizide 5 mg XL daily at breakfast.    -Her son accompanies her to clinic is offering help in case she needs to be on insulin.   - she is encouraged to call clinic for blood glucose levels less than 70 or above 200 mg /dl.  - she is not a candidate for Metformin, SGLT2 inhibitors,  due to concurrent renal insufficiency.  Her most recent BUN is 34 along with creatinine of 1.1, will be retried with low-dose metformin if necessary.    - she is not a suitable candidate for incretin therapy either.    - Specific targets for  A1c;  LDL, HDL,  and Triglycerides were discussed with the patient.  2) Blood Pressure /Hypertension: Her blood pressure is not controlled to target.  she is not on any antihypertensive medications.  She will be considered for low-dose losartan with next visit.     3)  Lipids/Hyperlipidemia:   Review of her recent lipid panel showed  controlled  LDL at 49 .  she  is advised to continue Crestor 10 mg p.o. daily at bedtime.  Side effects and precautions discussed with her.  4)  Weight/Diet:  Body mass index is 24.21 kg/m.  She is not a candidate for weight loss.  She cannot exercise optimally, detailed carbohydrates information provided  -  detailed on discharge instructions.  5) Chronic Care/Health Maintenance:  -she  is on  Statin medications and  is encouraged to initiate and continue to follow up with Ophthalmology, Dentist,  Podiatrist at least yearly or according to recommendations, and advised to   stay away from smoking. I have recommended yearly flu vaccine and pneumonia vaccine at least every 5 years; moderate intensity exercise for up to 150 minutes weekly; and  sleep for at least 7 hours a day.  - she is  advised to maintain close follow up with [facility] for primary care needs, as well as her other providers for optimal and coordinated care.  - Time spent on this patient care encounter:  35 min, of which > 50% was spent in  counseling and the rest reviewing her blood glucose logs , discussing her hypoglycemia and hyperglycemia episodes, reviewing her current and  previous labs / studies  ( including abstraction from other facilities) and medications  doses and developing a  long term treatment plan and documenting her care.   Please refer to Patient Instructions for Blood Glucose Monitoring and Insulin/Medications Dosing Guide"  in media tab for additional information. Please  also refer to " Patient Self Inventory" in the Media  tab for reviewed elements of pertinent patient history.  Linda CelestineMargaret P Hess participated in the discussions, expressed understanding, and voiced agreement with the above plans.  All questions were answered to her satisfaction. she is encouraged to contact clinic should she have any questions or concerns prior to her return visit.    Follow up plan: - Return in about 7 weeks (around 06/18/2020) for Bring Meter and Logs- A1c in Office.  Marquis LunchGebre Aviendha Azbell, MD [facility] [address] [place], [place] [address] Phone: [phone]  Fax: [phone]    04/30/2020, 12:45 PM  This note was partially dictated with voice recognition software. Similar sounding words can be transcribed inadequately or may not  be corrected upon review.

## 2020-04-30 NOTE — Patient Instructions (Signed)

## 2020-06-12 ENCOUNTER — Other Ambulatory Visit: Payer: Self-pay

## 2020-06-12 MED ORDER — GLIPIZIDE ER 5 MG PO TB24: 5.0000 mg | ORAL_TABLET | Freq: Every day | ORAL | 0 refills | Status: DC

## 2020-06-20 ENCOUNTER — Encounter: Payer: Self-pay | Admitting: "Endocrinology

## 2020-06-20 ENCOUNTER — Ambulatory Visit: Payer: Medicare Other | Admitting: "Endocrinology

## 2020-06-20 ENCOUNTER — Other Ambulatory Visit: Payer: Self-pay

## 2020-06-20 VITALS — BP 136/75 | HR 76 | Ht 66.0 in | Wt 154.0 lb

## 2020-06-20 DIAGNOSIS — IMO0002 Reserved for concepts with insufficient information to code with codable children: Secondary | ICD-10-CM

## 2020-06-20 DIAGNOSIS — E1122 Type 2 diabetes mellitus with diabetic chronic kidney disease: Secondary | ICD-10-CM

## 2020-06-20 DIAGNOSIS — N183 Chronic kidney disease, stage 3 unspecified: Secondary | ICD-10-CM | POA: Diagnosis not present

## 2020-06-20 DIAGNOSIS — E1165 Type 2 diabetes mellitus with hyperglycemia: Secondary | ICD-10-CM | POA: Diagnosis not present

## 2020-06-20 DIAGNOSIS — E782 Mixed hyperlipidemia: Secondary | ICD-10-CM | POA: Diagnosis not present

## 2020-06-20 DIAGNOSIS — I10 Essential (primary) hypertension: Secondary | ICD-10-CM | POA: Diagnosis not present

## 2020-06-20 LAB — POCT GLYCOSYLATED HEMOGLOBIN (HGB A1C): Hemoglobin A1C: 7.9 % — AB (ref 4.0–5.6)

## 2020-06-20 MED ORDER — METFORMIN HCL ER 500 MG PO TB24: 500.0000 mg | ORAL_TABLET | Freq: Every day | ORAL | 3 refills | Status: DC

## 2020-06-20 NOTE — Progress Notes (Signed)
06/20/2020, 5:17 PM  Endocrinology follow-up note   Subjective:    Patient ID: Linda Hess, female    DOB: [birth year].  Linda Hess is being seen in follow-up after she was seen in consultation for management of currently uncontrolled symptomatic diabetes requested by  [facility]   Past Medical History:  Diagnosis Date  . CIDP (chronic inflammatory demyelinating polyneuropathy) (HCC)   . Essential hypertension, benign    pt denies  . History of DVT (deep vein thrombosis)    Reportedly left leg, details not clear  . Mixed hyperlipidemia   . Type 2 diabetes mellitus (HCC)     Past Surgical History:  Procedure Laterality Date  . CHOLECYSTECTOMY    . Left quadriceps muscle biopsy  March 2010  . Right knee replacement  September 2014    Social History   Socioeconomic History  . Marital status: Widowed    Spouse name: Not on file  . Number of children: Not on file  . Years of education: Not on file  . Highest education level: Not on file  Occupational History  . Not on file  Tobacco Use  . Smoking status: Never Smoker  . Smokeless tobacco: Never Used  Vaping Use  . Vaping Use: Never used  Substance and Sexual Activity  . Alcohol use: No  . Drug use: No  . Sexual activity: Not Currently  Other Topics Concern  . Not on file  Social History Narrative  . Not on file   Social Determinants of Health   Financial Resource Strain:   . Difficulty of Paying Living Expenses:   Food Insecurity:   . Worried About Programme researcher, broadcasting/film/video in the Last Year:   . Barista in the Last Year:   Transportation Needs:   . Freight forwarder (Medical):   Marland Kitchen Lack of Transportation (Non-Medical):   Physical Activity:   . Days of Exercise per Week:   . Minutes of Exercise per Session:   Stress:   . Feeling of Stress :   Social Connections:   .  Frequency of Communication with Friends and Family:   . Frequency of Social Gatherings with Friends and Family:   . Attends Religious Services:   . Active Member of Clubs or Organizations:   . Attends Banker Meetings:   Marland Kitchen Marital Status:     Family History  Problem Relation Age of Onset  . Heart attack Father   . Stroke Mother   . Cancer Brother     Outpatient Encounter Medications as of 06/20/2020  Medication Sig  . acetaminophen (TYLENOL) 325 MG tablet Take 325 mg by mouth every 6 (six) hours as needed for mild pain.  . calcium-vitamin D (OSCAL WITH D) 500-200 MG-UNIT per tablet Take 1 tablet by mouth at bedtime.   . folic acid (FOLVITE) 1 MG tablet Take 1 mg by mouth every morning.   . gabapentin (NEURONTIN) 100 MG capsule Take 100 mg by mouth 3 (three) times daily.   Marland Kitchen JANUVIA 100 MG tablet Take 100 mg by  mouth daily.  . Omega-3 Fatty Acids (FISH OIL) 1200 MG CAPS Take 1 capsule by mouth every morning.   . predniSONE (DELTASONE) 10 MG tablet Take 10 mg by mouth daily.   . rivaroxaban (XARELTO) 20 MG TABS tablet Take 20 mg by mouth every morning.  . rosuvastatin (CRESTOR) 10 MG tablet Take 10 mg by mouth daily.  . traMADol (ULTRAM) 50 MG tablet Take 50 mg by mouth every 6 (six) hours as needed for moderate pain.   . vitamin C (ASCORBIC ACID) 500 MG tablet Take 500 mg by mouth daily.  . [DISCONTINUED] glipiZIDE (GLUCOTROL XL) 5 MG 24 hr tablet Take 1 tablet (5 mg total) by mouth daily with breakfast.  . metFORMIN (GLUCOPHAGE XR) 500 MG 24 hr tablet Take 1 tablet (500 mg total) by mouth daily with breakfast.   No facility-administered encounter medications on file as of 06/20/2020.    ALLERGIES: Allergies  Allergen Reactions  . Amoxicillin-Pot Clavulanate Hives  . Other Hives and Itching    RED MEAT  . Penicillins     hives    VACCINATION STATUS:  There is no immunization history on file for this patient.  Diabetes She presents for her follow-up diabetic  visit. She has type 2 diabetes mellitus. Onset time: She was diagnosed with type 2 diabetes at approximately age of 67 years. Her disease course has been improving (She was previously seen in this clinic with reasonable control of diabetes.  She did not return for follow-up.  She is being referred with higher A1c of 12.2%.). There are no hypoglycemic associated symptoms. Pertinent negatives for hypoglycemia include no confusion, headaches, pallor or seizures. Associated symptoms include blurred vision and fatigue. Pertinent negatives for diabetes include no chest pain, no polydipsia, no polyphagia and no polyuria. There are no hypoglycemic complications. Symptoms are improving. Diabetic complications include nephropathy. Risk factors for coronary artery disease include dyslipidemia, diabetes mellitus, tobacco exposure, sedentary lifestyle and post-menopausal. Current diabetic treatments: She is on Januvia 100 mg daily. Her weight is fluctuating minimally. She is following a generally unhealthy diet. When asked about meal planning, she reported none. She has not had a previous visit with a dietitian. She never participates in exercise. Her home blood glucose trend is decreasing steadily. Her breakfast blood glucose range is generally 110-130 mg/dl. Her bedtime blood glucose range is generally 140-180 mg/dl. Her overall blood glucose range is 140-180 mg/dl. (She presents with tightly controlled glycemic profile at fasting and near target glycemic profile postprandially.  Her point-of-care A1c 7.9%, significantly improving from last visit A1c of 12.2%.  ) An ACE inhibitor/angiotensin II receptor blocker is not being taken. Eye exam is current.  Hyperlipidemia This is a chronic problem. The current episode started more than 1 year ago. The problem is uncontrolled. Exacerbating diseases include diabetes. Pertinent negatives include no chest pain or shortness of breath. Current antihyperlipidemic treatment includes  statins. Risk factors for coronary artery disease include dyslipidemia, diabetes mellitus, a sedentary lifestyle and post-menopausal.     Review of Systems  Constitutional: Positive for fatigue. Negative for chills, fever and unexpected weight change.  HENT: Negative for trouble swallowing and voice change.   Eyes: Positive for blurred vision. Negative for visual disturbance.  Respiratory: Negative for cough, shortness of breath and wheezing.   Cardiovascular: Negative for chest pain, palpitations and leg swelling.  Gastrointestinal: Negative for diarrhea, nausea and vomiting.  Endocrine: Negative for cold intolerance, heat intolerance, polydipsia, polyphagia and polyuria.  Musculoskeletal: Positive for arthralgias and gait  problem.  Skin: Negative for color change, pallor, rash and wound.  Neurological: Negative for seizures and headaches.  Psychiatric/Behavioral: Negative for confusion and suicidal ideas.    Objective:    Vitals with BMI 06/20/2020 04/30/2020 04/15/2020  Height 5\' 6"  5\' 6"  5\' 6"   Weight 154 lbs 150 lbs 148 lbs 6 oz  BMI 24.87 24.22 23.96  Systolic 136 150 [id number]  Diastolic 75 91 85  Pulse 76 84 72    BP 136/75 (BP Location: Right Arm, Patient Position: Sitting)   Pulse 76   Ht 5\' 6"  (1.676 m)   Wt 154 lb (69.9 kg)   BMI 24.86 kg/m   Wt Readings from Last 3 Encounters:  06/20/20 154 lb (69.9 kg)  04/30/20 150 lb (68 kg)  04/15/20 148 lb 6.4 oz (67.3 kg)     Physical Exam Constitutional:      Appearance: She is well-developed.  HENT:     Head: Normocephalic and atraumatic.  Neck:     Thyroid: No thyromegaly.     Trachea: No tracheal deviation.  Cardiovascular:     Rate and Rhythm: Normal rate.  Pulmonary:     Effort: Pulmonary effort is normal.  Abdominal:     Tenderness: There is no abdominal tenderness. There is no guarding.  Musculoskeletal:        General: Normal range of motion.     Cervical back: Normal range of motion and neck supple.      Comments: She ambulates with a walker due to diffuse deconditioning from diffuse arthritis  and disequilibrium.    Skin:    General: Skin is warm and dry.     Coloration: Skin is not pale.     Findings: No erythema or rash.  Neurological:     Mental Status: She is alert and oriented to person, place, and time.     Cranial Nerves: No cranial nerve deficit.     Coordination: Coordination normal.     Deep Tendon Reflexes: Reflexes are normal and symmetric.  Psychiatric:        Judgment: Judgment normal.      CMP ( most recent) CMP     Component Value Date/Time   NA 135 04/02/2019 1224   NA 139 08/30/2013 0508   K 4.6 04/02/2019 1224   K 4.3 08/31/2013 0443   CL 104 04/02/2019 1224   CL 107 08/30/2013 0508   CO2 23 04/02/2019 1224   CO2 26 08/30/2013 0508   GLUCOSE 371 (H) 04/02/2019 1224   GLUCOSE 164 (H) 08/30/2013 0508   BUN 34 (A) 03/13/2020 0000   BUN 27 (H) 08/30/2013 0508   CREATININE 1.1 03/13/2020 0000   CREATININE 1.34 (H) 04/02/2019 1224   CREATININE 1.34 (H) 04/28/2016 0925   CALCIUM 9.1 04/02/2019 1224   CALCIUM 9.1 08/30/2013 0508   PROT 6.2 (L) 04/02/2019 1224   ALBUMIN 3.6 04/02/2019 1224   AST 19 04/02/2019 1224   ALT 21 04/02/2019 1224   ALKPHOS 50 04/02/2019 1224   BILITOT 1.3 (H) 04/02/2019 1224   GFRNONAA 36 (L) 04/02/2019 1224   GFRNONAA 52 (L) 08/30/2013 0508   GFRAA 42 (L) 04/02/2019 1224   GFRAA >60 08/30/2013 0508     Diabetic Labs (most recent): Lab Results  Component Value Date   HGBA1C 7.9 (A) 06/20/2020   HGBA1C 12.2 03/13/2020   HGBA1C 7.3 (H) 04/28/2016     Lipid Panel ( most recent) Lipid Panel     Component Value Date/Time  CHOL 148 04/28/2016 0925   TRIG 178 (H) 04/28/2016 0925   HDL 63 04/28/2016 0925   CHOLHDL 2.3 04/28/2016 0925   VLDL 36 (H) 04/28/2016 0925   LDLCALC 49 04/28/2016 0925      Lab Results  Component Value Date   TSH 1.41 04/28/2016   FREET4 1.2 04/28/2016      Assessment & Plan:   1.  Uncontrolled type 2 diabetes mellitus with stage 3 chronic kidney disease ([place])  - Lowell Bouton has currently uncontrolled symptomatic type 2 DM since  86 years of age.  -  She returns with tight control of glycemic profile at fasting, point-of-care A1c of 7.9% improved from 12.2%.  Recent labs reviewed. - I had a long discussion with her about the progressive nature of diabetes and the pathology behind its complications.   -her diabetes is complicated by CKD and she remains at a high risk for more acute and chronic complications which include CAD, CVA, CKD, retinopathy, and neuropathy. These are all discussed in detail with her.  - I have counseled her on diet management  by adopting a carbohydrate restricted/protein rich diet. Patient is encouraged to switch to  unprocessed or minimally processed     complex starch and increased protein intake (animal or plant source), fruits, and vegetables. -  she is advised to stick to a routine mealtimes to eat 3 meals  a day and avoid unnecessary snacks ( to snack only to correct hypoglycemia).   - she  admits there is a room for improvement in her diet and drink choices. -  Suggestion is made for her to avoid simple carbohydrates  from her diet including Cakes, Sweet Desserts / Pastries, Ice Cream, Soda (diet and regular), Sweet Tea, Candies, Chips, Cookies, Sweet Pastries,  Store Bought Juices, Alcohol in Excess of  1-2 drinks a day, Artificial Sweeteners, Coffee Creamer, and "Sugar-free" Products. This will help patient to have stable blood glucose profile and potentially avoid unintended weight gain.    - she will be scheduled with Jearld Fenton, RDN, CDE for diabetes education.  - I have approached her with the following individualized plan to manage  her diabetes and patient agrees:   -She has responded to low-dose glipizide, with some rare, mild fasting hypoglycemia.  Priority in this patient will be to avoid hypoglycemia.   -She promises  to stay engaged in the modified lifestyle.  She really qualifies for low-dose Metformin given her recent renal function.  I discussed and resumed her on Metformin 500 mg XR p.o. daily at breakfast.  She is also on Januvia 100 mg p.o. daily at breakfast.  She is advised to discontinue glipizide at this time.  -Her son accompanies her to clinic is offering help in case she needs to be on insulin.   -She will continue to monitor blood glucose at least once a day-before breakfast. - she is encouraged to call clinic for blood glucose levels less than 70 or above 200 mg /dl.   - she is not a suitable candidate for incretin therapy either.    - Specific targets for  A1c;  LDL, HDL,  and Triglycerides were discussed with the patient.  2) Blood Pressure /Hypertension: Her blood pressure is controlled to target. she is not on any antihypertensive medications.  She will be considered for low-dose losartan with next visit.     3) Lipids/Hyperlipidemia:   Review of her recent lipid panel showed  controlled  LDL at 49 .  she  is advised to continue Crestor 10 mg p.o. daily at bedtime.   Side effects and precautions discussed with her.  4)  Weight/Diet:  Body mass index is 24.86 kg/m.  She is not a candidate for weight loss.  She cannot exercise optimally, detailed carbohydrates information provided  -  detailed on discharge instructions.  5) Chronic Care/Health Maintenance:  -she  is on  Statin medications and  is encouraged to initiate and continue to follow up with Ophthalmology, Dentist,  Podiatrist at least yearly or according to recommendations, and advised to   stay away from smoking. I have recommended yearly flu vaccine and pneumonia vaccine at least every 5 years; moderate intensity exercise for up to 150 minutes weekly; and  sleep for at least 7 hours a day.  - she is  advised to maintain close follow up with [facility] for primary care needs, as well as her other  providers for optimal and coordinated care.  - Time spent on this patient care encounter:  35 min, of which > 50% was spent in  counseling and the rest reviewing her blood glucose logs , discussing her hypoglycemia and hyperglycemia episodes, reviewing her current and  previous labs / studies  ( including abstraction from other facilities) and medications  doses and developing a  long term treatment plan and documenting her care.   Please refer to Patient Instructions for Blood Glucose Monitoring and Insulin/Medications Dosing Guide"  in media tab for additional information. Please  also refer to " Patient Self Inventory" in the Media  tab for reviewed elements of pertinent patient history.  Linda Hess participated in the discussions, expressed understanding, and voiced agreement with the above plans.  All questions were answered to her satisfaction. she is encouraged to contact clinic should she have any questions or concerns prior to her return visit.   Follow up plan: - Return in about 4 months (around 10/20/2020) for Bring Meter and Logs- A1c in Office.  Marquis Lunch, MD [facility] [address] [place], [place] [address] Phone: [phone]  Fax: [phone]    06/20/2020, 5:17 PM  This note was partially dictated with voice recognition software. Similar sounding words can be transcribed inadequately or may not  be corrected upon review.

## 2020-07-04 ENCOUNTER — Encounter (HOSPITAL_COMMUNITY): Payer: Self-pay | Admitting: Emergency Medicine

## 2020-07-04 ENCOUNTER — Other Ambulatory Visit: Payer: Self-pay

## 2020-07-04 ENCOUNTER — Emergency Department (HOSPITAL_COMMUNITY)
Admission: EM | Admit: 2020-07-04 | Discharge: 2020-07-04 | Disposition: A | Discharge status: Home / Self Care | Payer: Medicare Other | Attending: Emergency Medicine | Admitting: Emergency Medicine

## 2020-07-04 DIAGNOSIS — E1122 Type 2 diabetes mellitus with diabetic chronic kidney disease: Secondary | ICD-10-CM | POA: Diagnosis not present

## 2020-07-04 DIAGNOSIS — Z7901 Long term (current) use of anticoagulants: Secondary | ICD-10-CM | POA: Diagnosis not present

## 2020-07-04 DIAGNOSIS — N183 Chronic kidney disease, stage 3 unspecified: Secondary | ICD-10-CM | POA: Insufficient documentation

## 2020-07-04 DIAGNOSIS — Z86718 Personal history of other venous thrombosis and embolism: Secondary | ICD-10-CM | POA: Insufficient documentation

## 2020-07-04 DIAGNOSIS — S81812A Laceration without foreign body, left lower leg, initial encounter: Secondary | ICD-10-CM | POA: Diagnosis not present

## 2020-07-04 DIAGNOSIS — I129 Hypertensive chronic kidney disease with stage 1 through stage 4 chronic kidney disease, or unspecified chronic kidney disease: Secondary | ICD-10-CM | POA: Insufficient documentation

## 2020-07-04 DIAGNOSIS — Y999 Unspecified external cause status: Secondary | ICD-10-CM | POA: Insufficient documentation

## 2020-07-04 DIAGNOSIS — Y92009 Unspecified place in unspecified non-institutional (private) residence as the place of occurrence of the external cause: Secondary | ICD-10-CM | POA: Insufficient documentation

## 2020-07-04 DIAGNOSIS — W228XXA Striking against or struck by other objects, initial encounter: Secondary | ICD-10-CM | POA: Diagnosis not present

## 2020-07-04 DIAGNOSIS — Z23 Encounter for immunization: Secondary | ICD-10-CM | POA: Insufficient documentation

## 2020-07-04 DIAGNOSIS — Z7984 Long term (current) use of oral hypoglycemic drugs: Secondary | ICD-10-CM | POA: Diagnosis not present

## 2020-07-04 DIAGNOSIS — Y939 Activity, unspecified: Secondary | ICD-10-CM | POA: Insufficient documentation

## 2020-07-04 DIAGNOSIS — Z5189 Encounter for other specified aftercare: Secondary | ICD-10-CM

## 2020-07-04 MED ORDER — TETANUS-DIPHTH-ACELL PERTUSSIS 5-2.5-18.5 LF-MCG/0.5 IM SUSP
0.5000 mL | Freq: Once | INTRAMUSCULAR | Status: AC
  Administered 2020-07-04: 0.5 mL via INTRAMUSCULAR
  Filled 2020-07-04: qty 0.5

## 2020-07-04 NOTE — Discharge Instructions (Addendum)
Keep your wound dry for 24 hours, then you can shower normally.  We put on quick clotting medicine and wrapped it tight in a bandage.  Keep off your leg for the rest of the night.  The bleeding should slow down.  If it starts bleeding again, have a family member hold VERY FIRM PRESSURE directly over the bleeding site with a clean paper towel for 10 minutes.  If it is still bleeding you can come back to the ER (or an urgent care), as you may need stitches at that time.  I did not see anything to stitch today.

## 2020-07-04 NOTE — ED Notes (Signed)
QuickClot gauze and bulky bandage gauze applied to left calf wound.

## 2020-07-04 NOTE — ED Triage Notes (Signed)
Pt from home via [facility]. Pt reports hitting her left calf on a screen door yesterday. Pt states that she cannot get the wound to stop bleeding. Pt also received 4mg  zofran IM en route due to nausea.

## 2020-07-04 NOTE — ED Provider Notes (Signed)
[facility] EMERGENCY DEPARTMENT Provider Note   CSN: [id number] Arrival date & time: 07/04/20  2123     History Chief Complaint  Patient presents with   Wound Check    Linda Hess is a 86 y.o. female on xarelto presenting with laceration to left leg Cut leg on storm door yesterday Reports her leg keeps bleeding when she tries to walk on it  Does not think she's had tetanus booster in 5 years  HPI     Past Medical History:  Diagnosis Date   CIDP (chronic inflammatory demyelinating polyneuropathy) (HCC)    Essential hypertension, benign    pt denies   History of DVT (deep vein thrombosis)    Reportedly left leg, details not clear   Mixed hyperlipidemia    Type 2 diabetes mellitus (HCC)     Patient Active Problem List   Diagnosis Date Noted   Cellulitis of right upper extremity 12/14/2018   Peripheral arterial disease (HCC) 03/14/2014   CIDP (chronic inflammatory demyelinating polyneuropathy) (HCC) 03/14/2014   Mixed hyperlipidemia 03/12/2014   Essential hypertension, benign 03/12/2014   Uncontrolled type 2 diabetes mellitus with stage 3 chronic kidney disease (HCC) 03/12/2014    Past Surgical History:  Procedure Laterality Date   CHOLECYSTECTOMY     Left quadriceps muscle biopsy  March 2010   Right knee replacement  September 2014     OB History    Gravida  4   Para  4   Term  4   Preterm      AB      Living        SAB      TAB      Ectopic      Multiple      Live Births              Family History  Problem Relation Age of Onset   Heart attack Father    Stroke Mother    Cancer Brother     Social History   Tobacco Use   Smoking status: Never Smoker   Smokeless tobacco: Never Used  Building services engineer Use: Never used  Substance Use Topics   Alcohol use: No   Drug use: No    Home Medications Prior to Admission medications   Medication Sig Start Date End Date Taking? Authorizing Provider    acetaminophen (TYLENOL) 325 MG tablet Take 325 mg by mouth every 6 (six) hours as needed for mild pain.    [provider]  calcium-vitamin D (OSCAL WITH D) 500-200 MG-UNIT per tablet Take 1 tablet by mouth at bedtime.  03/14/14   Jonelle Sidle, MD  folic acid (FOLVITE) 1 MG tablet Take 1 mg by mouth every morning.     [provider]  gabapentin (NEURONTIN) 100 MG capsule Take 100 mg by mouth 3 (three) times daily.  08/30/18   [provider]  JANUVIA 100 MG tablet Take 100 mg by mouth daily. 03/21/20   [provider]  metFORMIN (GLUCOPHAGE XR) 500 MG 24 hr tablet Take 1 tablet (500 mg total) by mouth daily with breakfast. 06/20/20   Nida, Denman George, MD  Omega-3 Fatty Acids (FISH OIL) 1200 MG CAPS Take 1 capsule by mouth every morning.     [provider]  predniSONE (DELTASONE) 10 MG tablet Take 10 mg by mouth daily.     [provider]  rivaroxaban (XARELTO) 20 MG TABS tablet Take 20 mg by mouth  every morning.    [provider]  rosuvastatin (CRESTOR) 10 MG tablet Take 10 mg by mouth daily. 03/30/19   [provider]  traMADol (ULTRAM) 50 MG tablet Take 50 mg by mouth every 6 (six) hours as needed for moderate pain.     [provider]  vitamin C (ASCORBIC ACID) 500 MG tablet Take 500 mg by mouth daily.    [provider]    Allergies    Amoxicillin-pot clavulanate, Other, and Penicillins  Review of Systems   Review of Systems  Constitutional: Negative for chills and fever.  Respiratory: Negative for cough and shortness of breath.   Cardiovascular: Negative for chest pain and palpitations.  Gastrointestinal: Negative for abdominal pain and vomiting.  Musculoskeletal: Negative for arthralgias and back pain.  Skin: Positive for rash and wound.  Neurological: Negative for syncope and light-headedness.  Psychiatric/Behavioral: Negative for agitation and confusion.  All other systems reviewed  and are negative.   Physical Exam Updated Vital Signs BP (!) 129/91    Pulse 81    Temp 98.2 F (36.8 C) (Oral)    Resp 18    Ht 5\' 6"  (1.676 m)    Wt 69.9 kg    SpO2 99%    BMI 24.86 kg/m   Physical Exam Vitals and nursing note reviewed.  Constitutional:      General: She is not in acute distress.    Appearance: She is well-developed.  HENT:     Head: Normocephalic and atraumatic.  Eyes:     Conjunctiva/sclera: Conjunctivae normal.  Cardiovascular:     Rate and Rhythm: Normal rate.     Pulses: Normal pulses.  Pulmonary:     Effort: Pulmonary effort is normal. No respiratory distress.  Abdominal:     Palpations: Abdomen is soft.     Tenderness: There is no abdominal tenderness.  Musculoskeletal:     Cervical back: Neck supple.  Skin:    General: Skin is warm and dry.     Comments: Curvilinear abrasion with superficial skin flap to left mid-posterior calf, no active bleeding on exam  Neurological:     Mental Status: She is alert.  Psychiatric:        Mood and Affect: Mood normal.        Behavior: Behavior normal.     ED Results / Procedures / Treatments   Labs (all labs ordered are listed, but only abnormal results are displayed) Labs Reviewed - No data to display  EKG None  Radiology No results found.  Procedures Procedures (including critical care time)  Medications Ordered in ED Medications  Tdap (BOOSTRIX) injection 0.5 mL (0.5 mLs Intramuscular Given 07/04/20 2209)    ED Course  I have reviewed the triage vital signs and the nursing notes.  Pertinent labs & imaging results that were available during my care of the patient were reviewed by me and considered in my medical decision making (see chart for details).  86 yo F here with abrasion to left calf with storm door injury yesterday.  Doubtful of fracture.  No active bleeding here.   There is nothing to suture, no visible artery bleed.  We'll apply quick clot, bandage, and I anticipate recovery in the  next day.  Will update tdap as well.    Final Clinical Impression(s) / ED Diagnoses Final diagnoses:  Laceration of calf, left, initial encounter  Visit for wound check    Rx / DC Orders ED Discharge Orders    None  Terald Sleeper, MD 07/05/20 [phone]

## 2020-09-09 ENCOUNTER — Other Ambulatory Visit: Payer: Self-pay | Admitting: "Endocrinology

## 2020-09-24 LAB — LIPID PANEL
Cholesterol: 139 (ref 0–200)
HDL: 67 (ref 35–70)
LDL Cholesterol: 51
Triglycerides: 129 (ref 40–160)

## 2020-09-24 LAB — COMPREHENSIVE METABOLIC PANEL
Calcium: 10.3 (ref 8.7–10.7)
GFR calc Af Amer: 48
GFR calc non Af Amer: 41

## 2020-09-24 LAB — BASIC METABOLIC PANEL
BUN: 38 — AB (ref 4–21)
Creatinine: 1.2 — AB (ref 0.5–1.1)

## 2020-09-24 LAB — HEMOGLOBIN A1C: Hemoglobin A1C: 8.3

## 2020-09-24 LAB — MICROALBUMIN, URINE: Microalb, Ur: 0.8

## 2020-10-23 ENCOUNTER — Ambulatory Visit: Payer: Medicare Other | Admitting: "Endocrinology

## 2020-10-23 ENCOUNTER — Other Ambulatory Visit: Payer: Self-pay

## 2020-10-23 ENCOUNTER — Encounter: Payer: Self-pay | Admitting: "Endocrinology

## 2020-10-23 VITALS — BP 110/74 | HR 64 | Ht 66.0 in | Wt 151.2 lb

## 2020-10-23 DIAGNOSIS — E1165 Type 2 diabetes mellitus with hyperglycemia: Secondary | ICD-10-CM

## 2020-10-23 DIAGNOSIS — E782 Mixed hyperlipidemia: Secondary | ICD-10-CM

## 2020-10-23 DIAGNOSIS — E1122 Type 2 diabetes mellitus with diabetic chronic kidney disease: Secondary | ICD-10-CM | POA: Diagnosis not present

## 2020-10-23 DIAGNOSIS — IMO0002 Reserved for concepts with insufficient information to code with codable children: Secondary | ICD-10-CM

## 2020-10-23 DIAGNOSIS — I10 Essential (primary) hypertension: Secondary | ICD-10-CM | POA: Diagnosis not present

## 2020-10-23 DIAGNOSIS — N183 Chronic kidney disease, stage 3 unspecified: Secondary | ICD-10-CM

## 2020-10-23 NOTE — Progress Notes (Signed)
10/23/2020, 10:46 AM  Endocrinology follow-up note   Subjective:    Patient ID: Linda Hess, female    DOB: May 25, 1931.  Linda Hess is being seen in follow-up after she was seen in consultation for management of currently uncontrolled type 2 diabetes.  PMD: Smith RobertKikel, Stephen, MD.   Past Medical History:  Diagnosis Date  . CIDP (chronic inflammatory demyelinating polyneuropathy) (HCC)   . Essential hypertension, benign    pt denies  . History of DVT (deep vein thrombosis)    Reportedly left leg, details not clear  . Mixed hyperlipidemia   . Type 2 diabetes mellitus (HCC)     Past Surgical History:  Procedure Laterality Date  . CHOLECYSTECTOMY    . Left quadriceps muscle biopsy  March 2010  . Right knee replacement  September 2014    Social History   Socioeconomic History  . Marital status: Widowed    Spouse name: Not on file  . Number of children: Not on file  . Years of education: Not on file  . Highest education level: Not on file  Occupational History  . Not on file  Tobacco Use  . Smoking status: Never Smoker  . Smokeless tobacco: Never Used  Vaping Use  . Vaping Use: Never used  Substance and Sexual Activity  . Alcohol use: No  . Drug use: No  . Sexual activity: Not Currently  Other Topics Concern  . Not on file  Social History Narrative  . Not on file   Social Determinants of Health   Financial Resource Strain:   . Difficulty of Paying Living Expenses: Not on file  Food Insecurity:   . Worried About Programme researcher, broadcasting/film/videounning Out of Food in the Last Year: Not on file  . Ran Out of Food in the Last Year: Not on file  Transportation Needs:   . Lack of Transportation (Medical): Not on file  . Lack of Transportation (Non-Medical): Not on file  Physical Activity:   . Days of Exercise per Week: Not on file  . Minutes of Exercise per Session: Not on file  Stress:   . Feeling  of Stress : Not on file  Social Connections:   . Frequency of Communication with Friends and Family: Not on file  . Frequency of Social Gatherings with Friends and Family: Not on file  . Attends Religious Services: Not on file  . Active Member of Clubs or Organizations: Not on file  . Attends BankerClub or Organization Meetings: Not on file  . Marital Status: Not on file    Family History  Problem Relation Age of Onset  . Heart attack Father   . Stroke Mother   . Cancer Brother     Outpatient Encounter Medications as of 10/23/2020  Medication Sig  . acetaminophen (TYLENOL) 325 MG tablet Take 325 mg by mouth every 6 (six) hours as needed for mild pain.  . calcium-vitamin D (OSCAL WITH D) 500-200 MG-UNIT per tablet Take 1 tablet by mouth at bedtime.   . gabapentin (NEURONTIN) 100 MG capsule Take 100 mg by mouth 3 (three) times daily.   .Marland Kitchen  JANUVIA 100 MG tablet Take 100 mg by mouth daily.  . metFORMIN (GLUCOPHAGE-XR) 500 MG 24 hr tablet TAKE 1 TABLET IN THE MORNING WITH FOOD  . Omega-3 Fatty Acids (FISH OIL) 1200 MG CAPS Take 1 capsule by mouth every morning.   . predniSONE (DELTASONE) 10 MG tablet Take 10 mg by mouth daily.   . rivaroxaban (XARELTO) 20 MG TABS tablet Take 20 mg by mouth every morning.  . rosuvastatin (CRESTOR) 10 MG tablet Take 10 mg by mouth daily.  . traMADol (ULTRAM) 50 MG tablet Take 50 mg by mouth every 6 (six) hours as needed for moderate pain.   . vitamin C (ASCORBIC ACID) 500 MG tablet Take 500 mg by mouth daily.  . [DISCONTINUED] folic acid (FOLVITE) 1 MG tablet Take 1 mg by mouth every morning.    No facility-administered encounter medications on file as of 10/23/2020.    ALLERGIES: Allergies  Allergen Reactions  . Amoxicillin-Pot Clavulanate Hives  . Other Hives and Itching    RED MEAT  . Penicillins     hives    VACCINATION STATUS: Immunization History  Administered Date(s) Administered  . Tdap 07/04/2020    Diabetes She presents for her  follow-up diabetic visit. She has type 2 diabetes mellitus. Onset time: She was diagnosed with type 2 diabetes at approximately age of 36 years. Her disease course has been worsening (She was previously seen in this clinic with reasonable control of diabetes.  She did not return for follow-up.  She is being referred with higher A1c of 12.2%.). There are no hypoglycemic associated symptoms. Pertinent negatives for hypoglycemia include no confusion, headaches, pallor or seizures. Associated symptoms include blurred vision and fatigue. Pertinent negatives for diabetes include no chest pain, no polydipsia, no polyphagia and no polyuria. There are no hypoglycemic complications. Symptoms are improving. Diabetic complications include nephropathy. Risk factors for coronary artery disease include dyslipidemia, diabetes mellitus, tobacco exposure, sedentary lifestyle and post-menopausal. Current diabetic treatments: She is on Januvia 100 mg daily. Her weight is fluctuating minimally. She is following a generally unhealthy diet. When asked about meal planning, she reported none. She has not had a previous visit with a dietitian. She never participates in exercise. There is no change in her home blood glucose trend. Her breakfast blood glucose range is generally 110-130 mg/dl. Her bedtime blood glucose range is generally 110-130 mg/dl. Her overall blood glucose range is 110-130 mg/dl. (She presents with tightly controlled glycemic profile at fasting and near target glycemic profile postprandially.  Her point-of-care A1c 7.9%, significantly improving from last visit A1c of 12.2%.  ) An ACE inhibitor/angiotensin II receptor blocker is not being taken. Eye exam is current.  Hyperlipidemia This is a chronic problem. The current episode started more than 1 year ago. The problem is uncontrolled. Exacerbating diseases include diabetes. Pertinent negatives include no chest pain or shortness of breath. Current antihyperlipidemic  treatment includes statins. Risk factors for coronary artery disease include dyslipidemia, diabetes mellitus, a sedentary lifestyle and post-menopausal.     Review of Systems  Constitutional: Positive for fatigue. Negative for chills, fever and unexpected weight change.  HENT: Negative for trouble swallowing and voice change.   Eyes: Positive for blurred vision. Negative for visual disturbance.  Respiratory: Negative for cough, shortness of breath and wheezing.   Cardiovascular: Negative for chest pain, palpitations and leg swelling.  Gastrointestinal: Negative for diarrhea, nausea and vomiting.  Endocrine: Negative for cold intolerance, heat intolerance, polydipsia, polyphagia and polyuria.  Musculoskeletal: Positive for arthralgias  and gait problem.  Skin: Negative for color change, pallor, rash and wound.  Neurological: Negative for seizures and headaches.  Psychiatric/Behavioral: Negative for confusion and suicidal ideas.    Objective:    Vitals with BMI 10/23/2020 07/04/2020 07/04/2020  Height  -   Weight 151 lbs 3 oz - 154 lbs  BMI 24.42 - 24.87  Systolic 110 129 -  Diastolic 74 91 -  Pulse 64 81 -    BP 110/74   Pulse 64   Ht  (1.676 m)   Wt 151 lb 3.2 oz (68.6 kg)   BMI 24.40 kg/m   Wt Readings from Last 3 Encounters:  10/23/20 151 lb 3.2 oz (68.6 kg)  07/04/20 154 lb (69.9 kg)  06/20/20 154 lb (69.9 kg)       CMP ( most recent) CMP     Component Value Date/Time   NA 135 04/02/2019 1224   NA 139 08/30/2013 0508   K 4.6 04/02/2019 1224   K 4.3 08/31/2013 0443   CL 104 04/02/2019 1224   CL 107 08/30/2013 0508   CO2 23 04/02/2019 1224   CO2 26 08/30/2013 0508   GLUCOSE 371 (H) 04/02/2019 1224   GLUCOSE 164 (H) 08/30/2013 0508   BUN 38 (A) 09/24/2020 0000   BUN 27 (H) 08/30/2013 0508   CREATININE 1.2 (A) 09/24/2020 0000   CREATININE 1.34 (H) 04/02/2019 1224   CREATININE 1.34 (H) 04/28/2016 0925   CALCIUM 10.3 09/24/2020 0000   CALCIUM 9.1  08/30/2013 0508   PROT 6.2 (L) 04/02/2019 1224   ALBUMIN 3.6 04/02/2019 1224   AST 19 04/02/2019 1224   ALT 21 04/02/2019 1224   ALKPHOS 50 04/02/2019 1224   BILITOT 1.3 (H) 04/02/2019 1224   GFRNONAA 41 09/24/2020 0000   GFRNONAA 52 (L) 08/30/2013 0508   GFRAA 48 09/24/2020 0000   GFRAA >60 08/30/2013 0508     Diabetic Labs (most recent): Lab Results  Component Value Date   HGBA1C 8.3 09/24/2020   HGBA1C 7.9 (A) 06/20/2020   HGBA1C 12.2 03/13/2020     Lipid Panel ( most recent) Lipid Panel     Component Value Date/Time   CHOL 139 09/24/2020 0000   TRIG 129 09/24/2020 0000   HDL 67 09/24/2020 0000   CHOLHDL 2.3 04/28/2016 0925   VLDL 36 (H) 04/28/2016 0925   LDLCALC 51 09/24/2020 0000      Lab Results  Component Value Date   TSH 1.41 04/28/2016   FREET4 1.2 04/28/2016      Assessment & Plan:   1. Uncontrolled type 2 diabetes mellitus with stage 3 chronic kidney disease (HCC)  - Linda Hess has currently uncontrolled symptomatic type 2 DM since  86 years of age.  -  She returns with controlled fasting glycemic profile, point-of-care A1c of 8.3%, overall improving from 12.2%.  She did not document or report hypoglycemia.    Recent labs reviewed. - I had a long discussion with her about the progressive nature of diabetes and the pathology behind its complications.   -her diabetes is complicated by CKD and she remains at a high risk for more acute and chronic complications which include CAD, CVA, CKD, retinopathy, and neuropathy. These are all discussed in detail with her.  - I have counseled her on diet management  by adopting a carbohydrate restricted/protein rich diet. Patient is encouraged to switch to  unprocessed or minimally processed     complex starch and increased protein intake (  animal or plant source), fruits, and vegetables. -  she is advised to stick to a routine mealtimes to eat 3 meals  a day and avoid unnecessary snacks ( to snack only to  correct hypoglycemia).   - she  admits there is a room for improvement in her diet and drink choices. -  Suggestion is made for her to avoid simple carbohydrates  from her diet including Cakes, Sweet Desserts / Pastries, Ice Cream, Soda (diet and regular), Sweet Tea, Candies, Chips, Cookies, Sweet Pastries,  Store Bought Juices, Alcohol in Excess of  1-2 drinks a day, Artificial Sweeteners, Coffee Creamer, and "Sugar-free" Products. This will help patient to have stable blood glucose profile and potentially avoid unintended weight gain.   - she will be scheduled with Norm Salt, RDN, CDE for diabetes education.  - I have approached her with the following individualized plan to manage  her diabetes and patient agrees:   -She was taken off of glipizide due to fasting hypoglycemia.  She will continue to benefit from her current regimen to avoid inadvertent hypoglycemia.   -Priority #1 in this patient will be to avoid hypoglycemia.   -She is advised to continue Metformin 500 mg XR p.o. daily at breakfast as well as Januvia 100 mg p.o. daily at breakfast.   -She is advised to continue monitoring blood glucose at least once a day-daily before breakfast.  - she is encouraged to call clinic for blood glucose levels less than 70 or above 200 mg /dl.   - she is not a suitable candidate for incretin therapy either.    - Specific targets for  A1c;  LDL, HDL,  and Triglycerides were discussed with the patient.  2) Blood Pressure /Hypertension: Her blood pressure is controlled to target. she is not on any antihypertensive medications.  She will be considered for low-dose losartan with next visit.     3) Lipids/Hyperlipidemia:   Review of her recent lipid panel showed  controlled  LDL at 49 .  she  is advised to continue Crestor 10 mg p.o. daily at bedtime.   Side effects and precautions discussed with her.  4)  Weight/Diet:  Body mass index is 24.4 kg/m.  She is not a candidate for weight loss.   She cannot exercise optimally, detailed carbohydrates information provided  -  detailed on discharge instructions.  5) Chronic Care/Health Maintenance:  -she  is on  Statin medications and  is encouraged to initiate and continue to follow up with Ophthalmology, Dentist,  Podiatrist at least yearly or according to recommendations, and advised to   stay away from smoking. I have recommended yearly flu vaccine and pneumonia vaccine at least every 5 years; moderate intensity exercise for up to 150 minutes weekly; and  sleep for at least 7 hours a day.  - she is  advised to maintain close follow up with Smith Robert, MD for primary care needs, as well as her other providers for optimal and coordinated care.  - Time spent on this patient care encounter:  35 min, of which > 50% was spent in  counseling and the rest reviewing her blood glucose logs , discussing her hypoglycemia and hyperglycemia episodes, reviewing her current and  previous labs / studies  ( including abstraction from other facilities) and medications  doses and developing a  long term treatment plan and documenting her care.   Please refer to Patient Instructions for Blood Glucose Monitoring and Insulin/Medications Dosing Guide"  in media tab for  additional information. Please  also refer to " Patient Self Inventory" in the Media  tab for reviewed elements of pertinent patient history.  Linda Hess participated in the discussions, expressed understanding, and voiced agreement with the above plans.  All questions were answered to her satisfaction. she is encouraged to contact clinic should she have any questions or concerns prior to her return visit.   Follow up plan: - Return in about 6 months (around 04/23/2021) for F/U with Pre-visit Labs, Meter, Logs, A1c here.Marquis Lunch, MD [facility] [address] [place], [place] [address] Phone: [phone]  Fax: [phone]     10/23/2020, 10:46 AM  This note was partially dictated with voice recognition software. Similar sounding words can be transcribed inadequately or may not  be corrected upon review.

## 2020-10-31 ENCOUNTER — Other Ambulatory Visit: Payer: Self-pay | Admitting: "Endocrinology

## 2021-04-07 LAB — BASIC METABOLIC PANEL
BUN: 36 — AB (ref 4–21)
Creatinine: 1.4 — AB (ref 0.5–1.1)

## 2021-04-07 LAB — COMPREHENSIVE METABOLIC PANEL
Calcium: 9.9 (ref 8.7–10.7)
GFR calc Af Amer: 40
GFR calc non Af Amer: 35

## 2021-04-07 LAB — HEMOGLOBIN A1C: Hemoglobin A1C: 8.5

## 2021-04-23 ENCOUNTER — Telehealth: Payer: Self-pay | Admitting: "Endocrinology

## 2021-04-23 NOTE — Telephone Encounter (Signed)
I called office, they are going to fax over

## 2021-04-23 NOTE — Telephone Encounter (Signed)
Pt has an appt tomorrow and she said she did her labs at Dr. Bryna Colander office and they told her they sent them here. Can you try to get them, pt is going to try as well.

## 2021-04-24 ENCOUNTER — Encounter: Payer: Self-pay | Admitting: "Endocrinology

## 2021-04-24 ENCOUNTER — Ambulatory Visit: Payer: Medicare Other | Admitting: "Endocrinology

## 2021-04-24 ENCOUNTER — Other Ambulatory Visit: Payer: Self-pay

## 2021-04-24 VITALS — BP 122/78 | HR 64 | Ht 66.0 in | Wt 149.8 lb

## 2021-04-24 DIAGNOSIS — IMO0002 Reserved for concepts with insufficient information to code with codable children: Secondary | ICD-10-CM

## 2021-04-24 DIAGNOSIS — E1122 Type 2 diabetes mellitus with diabetic chronic kidney disease: Secondary | ICD-10-CM

## 2021-04-24 DIAGNOSIS — I10 Essential (primary) hypertension: Secondary | ICD-10-CM

## 2021-04-24 DIAGNOSIS — N183 Chronic kidney disease, stage 3 unspecified: Secondary | ICD-10-CM

## 2021-04-24 DIAGNOSIS — E1165 Type 2 diabetes mellitus with hyperglycemia: Secondary | ICD-10-CM | POA: Diagnosis not present

## 2021-04-24 DIAGNOSIS — E782 Mixed hyperlipidemia: Secondary | ICD-10-CM

## 2021-04-24 NOTE — Patient Instructions (Signed)

## 2021-04-24 NOTE — Progress Notes (Signed)
04/24/2021, 4:32 PM  Endocrinology follow-up note   Subjective:    Patient ID: Linda Hess, female    DOB: May 10, 1931.  DAYLE SHERPA is being seen in follow-up after she was seen in consultation for management of currently uncontrolled type 2 diabetes.  PMD: Smith Robert, MD.   Past Medical History:  Diagnosis Date  . CIDP (chronic inflammatory demyelinating polyneuropathy) (HCC)   . Essential hypertension, benign    pt denies  . History of DVT (deep vein thrombosis)    Reportedly left leg, details not clear  . Mixed hyperlipidemia   . Type 2 diabetes mellitus (HCC)     Past Surgical History:  Procedure Laterality Date  . CHOLECYSTECTOMY    . Left quadriceps muscle biopsy  March 2010  . Right knee replacement  September 2014    Social History   Socioeconomic History  . Marital status: Widowed    Spouse name: Not on file  . Number of children: Not on file  . Years of education: Not on file  . Highest education level: Not on file  Occupational History  . Not on file  Tobacco Use  . Smoking status: Never Smoker  . Smokeless tobacco: Never Used  Vaping Use  . Vaping Use: Never used  Substance and Sexual Activity  . Alcohol use: No  . Drug use: No  . Sexual activity: Not Currently  Other Topics Concern  . Not on file  Social History Narrative  . Not on file   Social Determinants of Health   Financial Resource Strain: Not on file  Food Insecurity: Not on file  Transportation Needs: Not on file  Physical Activity: Not on file  Stress: Not on file  Social Connections: Not on file    Family History  Problem Relation Age of Onset  . Heart attack Father   . Stroke Mother   . Cancer Brother     Outpatient Encounter Medications as of 04/24/2021  Medication Sig  . acetaminophen (TYLENOL) 325 MG tablet Take 325 mg by mouth every 6 (six) hours as needed for mild  pain.  . calcium-vitamin D (OSCAL WITH D) 500-200 MG-UNIT per tablet Take 1 tablet by mouth at bedtime.   . gabapentin (NEURONTIN) 100 MG capsule Take 100 mg by mouth 3 (three) times daily.   Marland Kitchen JANUVIA 100 MG tablet Take 100 mg by mouth daily.  . metFORMIN (GLUCOPHAGE-XR) 500 MG 24 hr tablet TAKE 1 TABLET IN THE MORNING WITH FOOD  . Omega-3 Fatty Acids (FISH OIL) 1200 MG CAPS Take 1 capsule by mouth every morning.   . predniSONE (DELTASONE) 10 MG tablet Take 10 mg by mouth daily.   . rivaroxaban (XARELTO) 20 MG TABS tablet Take 20 mg by mouth every morning.  . rosuvastatin (CRESTOR) 10 MG tablet Take 10 mg by mouth daily.  . traMADol (ULTRAM) 50 MG tablet Take 50 mg by mouth every 6 (six) hours as needed for moderate pain.   . vitamin C (ASCORBIC ACID) 500 MG tablet Take 500 mg by mouth daily.   No facility-administered encounter medications on file as  of 04/24/2021.    ALLERGIES: Allergies  Allergen Reactions  . Amoxicillin-Pot Clavulanate Hives  . Other Hives and Itching    RED MEAT  . Penicillins     hives    VACCINATION STATUS: Immunization History  Administered Date(s) Administered  . Tdap 07/04/2020    Diabetes She presents for her follow-up diabetic visit. She has type 2 diabetes mellitus. Onset time: She was diagnosed with type 2 diabetes at approximately age of 87 years. Her disease course has been fluctuating (She was previously seen in this clinic with reasonable control of diabetes.  She did not return for follow-up.  She is being referred with higher A1c of 12.2%.). There are no hypoglycemic associated symptoms. Pertinent negatives for hypoglycemia include no confusion, headaches, pallor or seizures. Associated symptoms include blurred vision and fatigue. Pertinent negatives for diabetes include no chest pain, no polydipsia, no polyphagia and no polyuria. There are no hypoglycemic complications. Symptoms are worsening. Diabetic complications include nephropathy. Risk  factors for coronary artery disease include dyslipidemia, diabetes mellitus, tobacco exposure, sedentary lifestyle and post-menopausal. Current diabetic treatments: She is on Januvia 100 mg daily. Her weight is fluctuating minimally. She is following a generally unhealthy diet. When asked about meal planning, she reported none. She has not had a previous visit with a dietitian. She never participates in exercise. There is no change in her home blood glucose trend. Her breakfast blood glucose range is generally 110-130 mg/dl. Her bedtime blood glucose range is generally 110-130 mg/dl. Her overall blood glucose range is 110-130 mg/dl. (She presents with controlled fasting glycemic profile averaging 111 for the last 30 days.  Her point-of-care A1c is 8.5%, overall improving from 12.2%.  She did not document any hypoglycemia.    ) An ACE inhibitor/angiotensin II receptor blocker is not being taken. Eye exam is current.  Hyperlipidemia This is a chronic problem. The current episode started more than 1 year ago. The problem is uncontrolled. Exacerbating diseases include diabetes. Pertinent negatives include no chest pain or shortness of breath. Current antihyperlipidemic treatment includes statins. Risk factors for coronary artery disease include dyslipidemia, diabetes mellitus, a sedentary lifestyle and post-menopausal.     Review of Systems  Constitutional: Positive for fatigue. Negative for chills, fever and unexpected weight change.  HENT: Negative for trouble swallowing and voice change.   Eyes: Positive for blurred vision. Negative for visual disturbance.  Respiratory: Negative for cough, shortness of breath and wheezing.   Cardiovascular: Negative for chest pain, palpitations and leg swelling.  Gastrointestinal: Negative for diarrhea, nausea and vomiting.  Endocrine: Negative for cold intolerance, heat intolerance, polydipsia, polyphagia and polyuria.  Musculoskeletal: Positive for arthralgias and  gait problem.  Skin: Negative for color change, pallor, rash and wound.  Neurological: Negative for seizures and headaches.  Psychiatric/Behavioral: Negative for confusion and suicidal ideas.    Objective:    Vitals with BMI 04/24/2021 10/23/2020 07/04/2020  Height 5\' 6"  5\' 6"  -  Weight 149 lbs 13 oz 151 lbs 3 oz -  BMI 24.19 24.42 -  Systolic 122 110 [id number]  Diastolic 78 74 91  Pulse 64 64 81    BP 122/78   Pulse 64   Ht 5\' 6"  (1.676 m)   Wt 149 lb 12.8 oz (67.9 kg)   BMI 24.18 kg/m   Wt Readings from Last 3 Encounters:  04/24/21 149 lb 12.8 oz (67.9 kg)  10/23/20 151 lb 3.2 oz (68.6 kg)  07/04/20 154 lb (69.9 kg)  CMP ( most recent) CMP     Component Value Date/Time   NA 135 04/02/2019 1224   NA 139 08/30/2013 0508   K 4.6 04/02/2019 1224   K 4.3 08/31/2013 0443   CL 104 04/02/2019 1224   CL 107 08/30/2013 0508   CO2 23 04/02/2019 1224   CO2 26 08/30/2013 0508   GLUCOSE 371 (H) 04/02/2019 1224   GLUCOSE 164 (H) 08/30/2013 0508   BUN 36 (A) 04/07/2021 0000   BUN 27 (H) 08/30/2013 0508   CREATININE 1.4 (A) 04/07/2021 0000   CREATININE 1.34 (H) 04/02/2019 1224   CREATININE 1.34 (H) 04/28/2016 0925   CALCIUM 9.9 04/07/2021 0000   CALCIUM 9.1 08/30/2013 0508   PROT 6.2 (L) 04/02/2019 1224   ALBUMIN 3.6 04/02/2019 1224   AST 19 04/02/2019 1224   ALT 21 04/02/2019 1224   ALKPHOS 50 04/02/2019 1224   BILITOT 1.3 (H) 04/02/2019 1224   GFRNONAA 35 04/07/2021 0000   GFRNONAA 52 (L) 08/30/2013 0508   GFRAA 40 04/07/2021 0000   GFRAA >60 08/30/2013 0508     Diabetic Labs (most recent): Lab Results  Component Value Date   HGBA1C 8.5 04/07/2021   HGBA1C 8.3 09/24/2020   HGBA1C 7.9 (A) 06/20/2020     Lipid Panel ( most recent) Lipid Panel     Component Value Date/Time   CHOL 139 09/24/2020 0000   TRIG 129 09/24/2020 0000   HDL 67 09/24/2020 0000   CHOLHDL 2.3 04/28/2016 0925   VLDL 36 (H) 04/28/2016 0925   LDLCALC 51 09/24/2020 0000      Lab  Results  Component Value Date   TSH 1.41 04/28/2016   FREET4 1.2 04/28/2016      Assessment & Plan:   1. Uncontrolled type 2 diabetes mellitus with stage 3 chronic kidney disease (HCC)  - Jannett Celestine has currently uncontrolled symptomatic type 2 DM since  87 years of age.  She presents with controlled fasting glycemic profile averaging 111 for the last 30 days.  Her point-of-care A1c is 8.5%, overall improving from 12.2%.  She did not document any hypoglycemia.    Recent labs reviewed. - I had a long discussion with her about the progressive nature of diabetes and the pathology behind its complications.   -her diabetes is complicated by CKD and she remains at a high risk for more acute and chronic complications which include CAD, CVA, CKD, retinopathy, and neuropathy. These are all discussed in detail with her.  - I have counseled her on diet management  by adopting a carbohydrate restricted/protein rich diet. Patient is encouraged to switch to  unprocessed or minimally processed     complex starch and increased protein intake (animal or plant source), fruits, and vegetables. -  she is advised to stick to a routine mealtimes to eat 3 meals  a day and avoid unnecessary snacks ( to snack only to correct hypoglycemia).   - she acknowledges that there is a room for improvement in her food and drink choices. - Suggestion is made for her to avoid simple carbohydrates  from her diet including Cakes, Sweet Desserts, Ice Cream, Soda (diet and regular), Sweet Tea, Candies, Chips, Cookies, Store Bought Juices, Alcohol in Excess of  1-2 drinks a day, Artificial Sweeteners,  Coffee Creamer, and "Sugar-free" Products, Lemonade. This will help patient to have more stable blood glucose profile and potentially avoid unintended weight gain.     - she will be scheduled with Norm Salt, RDN, CDE  for diabetes education.  - I have approached her with the following individualized plan to manage   her diabetes and patient agrees:   -Priority #1 in this patient remains to be avoiding hypoglycemia.  She was taken off of glipizide due to random hypoglycemia during her last visit.   -Given her advanced age, target A1c for her would be between 8-8.5%. -She wishes to avoid injectable treatment for diabetes.  She presents with several glycemic profile at fasting.  She is advised to continue metformin 500 mg p.o. daily along with Januvia 100 mg p.o. daily at breakfast.    -She is advised to continue monitoring blood glucose at least once a day-daily before breakfast.  - she is encouraged to call clinic for blood glucose levels less than 70 or above 200 mg /dl.   - she is not a suitable candidate for incretin therapy either.    - Specific targets for  A1c;  LDL, HDL,  and Triglycerides were discussed with the patient.  2) Blood Pressure /Hypertension: Her blood pressure is controlled to target.   she is not on any antihypertensive medications.  She will be considered for low-dose losartan with next visit.     3) Lipids/Hyperlipidemia:   Review of her recent lipid panel showed  controlled  LDL at 49 .  she  is advised to continue Crestor 10 mg p.o. daily at bedtime.    Side effects and precautions discussed with her.  4)  Weight/Diet:  Body mass index is 24.18 kg/m.  She is not a candidate for weight loss.  She cannot exercise optimally, detailed carbohydrates information provided  -  detailed on discharge instructions.  5) Chronic Care/Health Maintenance:  -she  is on  Statin medications and  is encouraged to initiate and continue to follow up with Ophthalmology, Dentist,  Podiatrist at least yearly or according to recommendations, and advised to   stay away from smoking. I have recommended yearly flu vaccine and pneumonia vaccine at least every 5 years; moderate intensity exercise for up to 150 minutes weekly; and  sleep for at least 7 hours a day.  - she is  advised to maintain close  follow up with Smith Robert, MD for primary care needs, as well as her other providers for optimal and coordinated care.   I spent 40 minutes in the care of the patient today including review of labs from CMP, Lipids, Thyroid Function, Hematology (current and previous including abstractions from other facilities); face-to-face time discussing  her blood glucose readings/logs, discussing hypoglycemia and hyperglycemia episodes and symptoms, medications doses, her options of short and long term treatment based on the latest standards of care / guidelines;  discussion about incorporating lifestyle medicine;  and documenting the encounter.    Please refer to Patient Instructions for Blood Glucose Monitoring and Insulin/Medications Dosing Guide"  in media tab for additional information. Please  also refer to " Patient Self Inventory" in the Media  tab for reviewed elements of pertinent patient history.  Jannett Celestine participated in the discussions, expressed understanding, and voiced agreement with the above plans.  All questions were answered to her satisfaction. she is encouraged to contact clinic should she have any questions or concerns prior to her return visit.   Follow up plan: - Return in about 4 months (around 08/24/2021) for Bring Meter and Logs- A1c in Office.  Marquis Lunch, MD [facility] [address] [place], [place] [address] Phone: [phone]  Fax: [phone]    04/24/2021, 4:32 PM  This note was partially dictated with voice recognition software. Similar sounding words can be transcribed inadequately or may not  be corrected upon review.

## 2021-05-20 ENCOUNTER — Ambulatory Visit: Payer: Medicare Other | Admitting: Orthopaedic Surgery

## 2021-05-20 ENCOUNTER — Other Ambulatory Visit: Payer: Self-pay

## 2021-05-20 ENCOUNTER — Encounter: Payer: Self-pay | Admitting: Orthopaedic Surgery

## 2021-05-20 DIAGNOSIS — Z7901 Long term (current) use of anticoagulants: Secondary | ICD-10-CM

## 2021-05-20 DIAGNOSIS — M25511 Pain in right shoulder: Secondary | ICD-10-CM

## 2021-05-20 DIAGNOSIS — G8929 Other chronic pain: Secondary | ICD-10-CM

## 2021-05-20 NOTE — Progress Notes (Signed)
Subjective:    Patient ID: Linda Hess, female    DOB: 10/15/1931, 87 y.o.   MRN: [id number]  HPI She has long history of pain of the right shoulder and neck.  She has been followed at [facility] in [place], [facility] of [facility].  I have reviewed the notes.  She was last seen there last year.  She has pain of the right shoulder that hurts during the day with overhead use or just using the arm. She has no numbness. She has no pain at night.  She uses a walker and wheelchair at home.  She has no new trauma,no swelling no redness, no numbness.  She is accompanied by her son (#3 she says).  She is on anticoagulation and cannot take NSAIDs.  She uses a rub but has not used ice or heat.  Review of Systems  Constitutional: Positive for activity change.  Musculoskeletal: Positive for arthralgias, gait problem and myalgias. Joint swelling: anticoaa.  All other systems reviewed and are negative.  For Review of Systems, all other systems reviewed and are negative.  The following is a summary of the past history medically, past history surgically, known current medicines, social history and family history.  This information is gathered electronically by the computer from prior information and documentation.  I review this each visit and have found including this information at this point in the chart is beneficial and informative.   Past Medical History:  Diagnosis Date  . CIDP (chronic inflammatory demyelinating polyneuropathy) (HCC)   . Essential hypertension, benign    pt denies  . History of DVT (deep vein thrombosis)    Reportedly left leg, details not clear  . Mixed hyperlipidemia   . Type 2 diabetes mellitus (HCC)     Past Surgical History:  Procedure Laterality Date  . CHOLECYSTECTOMY    . Left quadriceps muscle biopsy  March 2010  . Right knee replacement  September 2014    Current Outpatient Medications on File Prior to Visit  Medication Sig Dispense Refill   . acetaminophen (TYLENOL) 325 MG tablet Take 325 mg by mouth every 6 (six) hours as needed for mild pain.    . calcium-vitamin D (OSCAL WITH D) 500-200 MG-UNIT per tablet Take 1 tablet by mouth at bedtime.     . gabapentin (NEURONTIN) 100 MG capsule Take 100 mg by mouth 3 (three) times daily.     Marland Kitchen JANUVIA 100 MG tablet Take 100 mg by mouth daily.    . metFORMIN (GLUCOPHAGE-XR) 500 MG 24 hr tablet TAKE 1 TABLET IN THE MORNING WITH FOOD 28 tablet 11  . Omega-3 Fatty Acids (FISH OIL) 1200 MG CAPS Take 1 capsule by mouth every morning.     . predniSONE (DELTASONE) 10 MG tablet Take 10 mg by mouth daily.     . rivaroxaban (XARELTO) 20 MG TABS tablet Take 20 mg by mouth every morning.    . rosuvastatin (CRESTOR) 10 MG tablet Take 10 mg by mouth daily.    . traMADol (ULTRAM) 50 MG tablet Take 50 mg by mouth every 6 (six) hours as needed for moderate pain.     . vitamin C (ASCORBIC ACID) 500 MG tablet Take 500 mg by mouth daily.     No current facility-administered medications on file prior to visit.    Social History   Socioeconomic History  . Marital status: Widowed    Spouse name: Not on file  . Number of children: Not on file  .  Years of education: Not on file  . Highest education level: Not on file  Occupational History  . Not on file  Tobacco Use  . Smoking status: Never Smoker  . Smokeless tobacco: Never Used  Vaping Use  . Vaping Use: Never used  Substance and Sexual Activity  . Alcohol use: No  . Drug use: No  . Sexual activity: Not Currently  Other Topics Concern  . Not on file  Social History Narrative  . Not on file   Social Determinants of Health   Financial Resource Strain: Not on file  Food Insecurity: Not on file  Transportation Needs: Not on file  Physical Activity: Not on file  Stress: Not on file  Social Connections: Not on file  Intimate Partner Violence: Not on file    Family History  Problem Relation Age of Onset  . Heart attack Father   . Stroke  Mother   . Cancer Brother     There were no vitals taken for this visit.  There is no height or weight on file to calculate BMI.      Objective:   Physical Exam Vitals and nursing note reviewed. Exam conducted with a chaperone present.  Constitutional:      Appearance: She is well-developed.  HENT:     Head: Normocephalic and atraumatic.  Eyes:     Conjunctiva/sclera: Conjunctivae normal.     Pupils: Pupils are equal, round, and reactive to light.  Cardiovascular:     Rate and Rhythm: Normal rate and regular rhythm.  Pulmonary:     Effort: Pulmonary effort is normal.  Abdominal:     Palpations: Abdomen is soft.  Musculoskeletal:       Arms:     Cervical back: Normal range of motion and neck supple.  Skin:    General: Skin is warm and dry.  Neurological:     Mental Status: She is alert and oriented to person, place, and time.     Cranial Nerves: No cranial nerve deficit.     Motor: No abnormal muscle tone.     Coordination: Coordination normal.     Deep Tendon Reflexes: Reflexes are normal and symmetric. Reflexes normal.  Psychiatric:        Behavior: Behavior normal.        Thought Content: Thought content normal.        Judgment: Judgment normal.           Assessment & Plan:   Encounter Diagnoses  Name Primary?  . Chronic right shoulder pain Yes  . Anticoagulated    PROCEDURE NOTE:  The patient request injection, verbal consent was obtained.  The right shoulder was prepped appropriately after time out was performed.   Sterile technique was observed and injection of 1 cc of Celestone 6 mg with several cc's of plain xylocaine. Anesthesia was provided by ethyl chloride and a 20-gauge needle was used to inject the shoulder area. A posterior approach was used.  The injection was tolerated well.  A band aid dressing was applied.  The patient was advised to apply ice later today and tomorrow to the injection sight as needed.  I will see her in three  weeks.  Call if any problem.  Precautions discussed.   Electronically Signed Darreld Mclean, MD 5/24/20221:05 PM

## 2021-05-21 NOTE — Progress Notes (Signed)
She was given Celestone

## 2021-06-10 ENCOUNTER — Ambulatory Visit: Payer: Medicare Other

## 2021-06-10 ENCOUNTER — Encounter: Payer: Self-pay | Admitting: Orthopaedic Surgery

## 2021-06-10 ENCOUNTER — Other Ambulatory Visit: Payer: Self-pay

## 2021-06-10 ENCOUNTER — Ambulatory Visit: Payer: Medicare Other | Admitting: Orthopaedic Surgery

## 2021-06-10 VITALS — Temp 97.8°F | Ht 64.0 in | Wt 149.0 lb

## 2021-06-10 DIAGNOSIS — M545 Low back pain, unspecified: Secondary | ICD-10-CM

## 2021-06-10 NOTE — Progress Notes (Signed)
My back really hurts.  She has been watering flowers with 1/2 gallon container.  She began having lower back pain several days ago that has gotten much worse.  She usually is in a wheelchair.  She uses a walker.  She has an area of pain in the back that will not go away.  She takes Tramadol and that has helped only a little.  She has no weakness.  She has no numbness.  She does have a long standing foot drop right with brace in place.  Her back is tight but no spasm.  More pain is at lower thoracic area and upper lumbar.  Slight scoliosis is present.  NV intact.  X-rays were done of the lumbar spine, reported separately.  I am concerned about a new L1 compression fracture.  MRI will be ordered.  Encounter Diagnosis  Name Primary?   Lumbar back pain Yes   MRI of lumbar spine.  Take the Tramadol.  Use Ice.  Use tylenol also.  Return after MRI.  Call if any problem.  Precautions discussed.  Electronically Signed Darreld Mclean, MD 6/14/202211:32 AM

## 2021-06-19 ENCOUNTER — Ambulatory Visit (HOSPITAL_COMMUNITY)
Admission: RE | Admit: 2021-06-19 | Discharge: 2021-06-19 | Disposition: A | Discharge status: Home / Self Care | Payer: Medicare Other | Source: Ambulatory Visit | Attending: Orthopaedic Surgery | Admitting: Orthopaedic Surgery

## 2021-06-19 DIAGNOSIS — M545 Low back pain, unspecified: Secondary | ICD-10-CM

## 2021-06-24 ENCOUNTER — Encounter: Payer: Self-pay | Admitting: Orthopaedic Surgery

## 2021-06-24 ENCOUNTER — Other Ambulatory Visit: Payer: Self-pay

## 2021-06-24 ENCOUNTER — Ambulatory Visit (INDEPENDENT_AMBULATORY_CARE_PROVIDER_SITE_OTHER): Payer: Medicare Other | Admitting: Orthopaedic Surgery

## 2021-06-24 VITALS — Ht 66.0 in | Wt 149.0 lb

## 2021-06-24 DIAGNOSIS — M5442 Lumbago with sciatica, left side: Secondary | ICD-10-CM

## 2021-06-24 DIAGNOSIS — G8929 Other chronic pain: Secondary | ICD-10-CM

## 2021-06-24 NOTE — Progress Notes (Signed)
My back is still hurting.  She had the MRI of the lumbar spine and it showed:  IMPRESSION: 1. Advanced generalized lumbar spine degeneration with scoliosis. 2. Severe spinal stenosis at L3-4 and L4-5. 3. High-grade foraminal impingement on the right at L2-3 and L3-4. Compressive left foraminal impingement at L4-5 and L5-S1. 4. Partially covered nodule in the right posterior canal at the level of T11-12, favor meningioma and in retrospect stable from 2016.  I have explained the findings to her and her son who is present.  I have independently reviewed the MRI.    I would like her to see neurosurgeon to see if she is able to have consideration for epidural and if that would help.  I have gone over precautions about her activities.  Encounter Diagnosis  Name Primary?   Chronic midline low back pain with left-sided sciatica Yes   To neurosurgeon.  Call if any problem.  Precautions discussed.  Electronically Signed Darreld Mclean, MD 6/28/202211:20 AM

## 2021-06-24 NOTE — Patient Instructions (Signed)
You have been referred to [facility] on [address] in [place], they will call you with appointment. If you have not heard anything in one week call them to schedule [phone]   

## 2021-07-14 ENCOUNTER — Emergency Department (HOSPITAL_COMMUNITY): Payer: Medicare Other

## 2021-07-14 ENCOUNTER — Other Ambulatory Visit: Payer: Self-pay

## 2021-07-14 ENCOUNTER — Emergency Department (HOSPITAL_COMMUNITY)
Admission: EM | Admit: 2021-07-14 | Discharge: 2021-07-14 | Disposition: A | Discharge status: Home / Self Care | Payer: Medicare Other | Attending: Emergency Medicine | Admitting: Emergency Medicine

## 2021-07-14 ENCOUNTER — Encounter (HOSPITAL_COMMUNITY): Payer: Self-pay | Admitting: Emergency Medicine

## 2021-07-14 DIAGNOSIS — Z96651 Presence of right artificial knee joint: Secondary | ICD-10-CM | POA: Diagnosis not present

## 2021-07-14 DIAGNOSIS — S7012XA Contusion of left thigh, initial encounter: Secondary | ICD-10-CM | POA: Diagnosis not present

## 2021-07-14 DIAGNOSIS — I129 Hypertensive chronic kidney disease with stage 1 through stage 4 chronic kidney disease, or unspecified chronic kidney disease: Secondary | ICD-10-CM | POA: Insufficient documentation

## 2021-07-14 DIAGNOSIS — N183 Chronic kidney disease, stage 3 unspecified: Secondary | ICD-10-CM | POA: Insufficient documentation

## 2021-07-14 DIAGNOSIS — M4726 Other spondylosis with radiculopathy, lumbar region: Secondary | ICD-10-CM

## 2021-07-14 DIAGNOSIS — E1169 Type 2 diabetes mellitus with other specified complication: Secondary | ICD-10-CM | POA: Diagnosis not present

## 2021-07-14 DIAGNOSIS — S79922A Unspecified injury of left thigh, initial encounter: Secondary | ICD-10-CM | POA: Diagnosis present

## 2021-07-14 DIAGNOSIS — M5416 Radiculopathy, lumbar region: Secondary | ICD-10-CM

## 2021-07-14 DIAGNOSIS — Z79899 Other long term (current) drug therapy: Secondary | ICD-10-CM | POA: Diagnosis not present

## 2021-07-14 DIAGNOSIS — R52 Pain, unspecified: Secondary | ICD-10-CM

## 2021-07-14 DIAGNOSIS — Z7984 Long term (current) use of oral hypoglycemic drugs: Secondary | ICD-10-CM | POA: Insufficient documentation

## 2021-07-14 DIAGNOSIS — E1122 Type 2 diabetes mellitus with diabetic chronic kidney disease: Secondary | ICD-10-CM | POA: Diagnosis not present

## 2021-07-14 DIAGNOSIS — Z7901 Long term (current) use of anticoagulants: Secondary | ICD-10-CM | POA: Diagnosis not present

## 2021-07-14 DIAGNOSIS — M79605 Pain in left leg: Secondary | ICD-10-CM | POA: Diagnosis not present

## 2021-07-14 DIAGNOSIS — E785 Hyperlipidemia, unspecified: Secondary | ICD-10-CM | POA: Diagnosis not present

## 2021-07-14 DIAGNOSIS — Y939 Activity, unspecified: Secondary | ICD-10-CM | POA: Insufficient documentation

## 2021-07-14 DIAGNOSIS — X58XXXA Exposure to other specified factors, initial encounter: Secondary | ICD-10-CM | POA: Diagnosis not present

## 2021-07-14 LAB — COMPREHENSIVE METABOLIC PANEL
ALT: 15 U/L (ref 0–44)
AST: 17 U/L (ref 15–41)
Albumin: 3.4 g/dL — ABNORMAL LOW (ref 3.5–5.0)
Alkaline Phosphatase: 38 U/L (ref 38–126)
Anion gap: 11 (ref 5–15)
BUN: 47 mg/dL — ABNORMAL HIGH (ref 8–23)
CO2: 22 mmol/L (ref 22–32)
Calcium: 9.3 mg/dL (ref 8.9–10.3)
Chloride: 100 mmol/L (ref 98–111)
Creatinine, Ser: 1.5 mg/dL — ABNORMAL HIGH (ref 0.44–1.00)
GFR, Estimated: 34 mL/min — ABNORMAL LOW (ref >60)
Glucose, Bld: 271 mg/dL — ABNORMAL HIGH (ref 70–99)
Potassium: 4.6 mmol/L (ref 3.5–5.1)
Sodium: 133 mmol/L — ABNORMAL LOW (ref 135–145)
Total Bilirubin: 1.6 mg/dL — ABNORMAL HIGH (ref 0.3–1.2)
Total Protein: 5.6 g/dL — ABNORMAL LOW (ref 6.5–8.1)

## 2021-07-14 LAB — CBC WITH DIFFERENTIAL/PLATELET
Abs Immature Granulocytes: 0.18 10*3/uL — ABNORMAL HIGH (ref 0.00–0.07)
Basophils Absolute: 0.1 10*3/uL (ref 0.0–0.1)
Basophils Relative: 1 %
Eosinophils Absolute: 0 10*3/uL (ref 0.0–0.5)
Eosinophils Relative: 0 %
HCT: 36.9 % (ref 36.0–46.0)
Hemoglobin: 11.8 g/dL — ABNORMAL LOW (ref 12.0–15.0)
Immature Granulocytes: 1 %
Lymphocytes Relative: 6 %
Lymphs Abs: 0.9 10*3/uL (ref 0.7–4.0)
MCH: 30.3 pg (ref 26.0–34.0)
MCHC: 32 g/dL (ref 30.0–36.0)
MCV: 94.9 fL (ref 80.0–100.0)
Monocytes Absolute: 1.4 10*3/uL — ABNORMAL HIGH (ref 0.1–1.0)
Monocytes Relative: 10 %
Neutro Abs: 12.2 10*3/uL — ABNORMAL HIGH (ref 1.7–7.7)
Neutrophils Relative %: 82 %
Platelets: 128 10*3/uL — ABNORMAL LOW (ref 150–400)
RBC: 3.89 MIL/uL (ref 3.87–5.11)
RDW: 12.7 % (ref 11.5–15.5)
WBC: 14.8 10*3/uL — ABNORMAL HIGH (ref 4.0–10.5)
nRBC: 0 % (ref 0.0–0.2)

## 2021-07-14 MED ORDER — PREDNISONE 50 MG PO TABS
60.0000 mg | ORAL_TABLET | Freq: Once | ORAL | Status: AC
  Administered 2021-07-14: 60 mg via ORAL
  Filled 2021-07-14: qty 1

## 2021-07-14 MED ORDER — PREDNISONE 10 MG PO TABS: ORAL_TABLET | ORAL | 0 refills | Status: DC

## 2021-07-14 MED ORDER — HYDROCODONE-ACETAMINOPHEN 5-325 MG PO TABS: 1.0000 | ORAL_TABLET | ORAL | 0 refills | Status: DC | PRN

## 2021-07-14 NOTE — ED Notes (Signed)
Unable to take BP d/t pain

## 2021-07-14 NOTE — ED Provider Notes (Signed)
[facility] EMERGENCY DEPARTMENT Provider Note   CSN: [id number] Arrival date & time: 07/14/21  1052     History Chief Complaint  Patient presents with   Leg Injury    Linda Hess is a 87 y.o. female.  Patient complains of pain in her left thigh.  No history of fall.  The history is provided by the patient and medical records. No language interpreter was used.  Leg Pain Location:  Leg Time since incident:  4 days Injury: no   Leg location:  L leg Pain details:    Quality:  Aching   Radiates to:  Does not radiate   Severity:  Moderate   Onset quality:  Sudden   Timing:  Constant Associated symptoms: no back pain and no fatigue       Past Medical History:  Diagnosis Date   CIDP (chronic inflammatory demyelinating polyneuropathy) (HCC)    Essential hypertension, benign    pt denies   History of DVT (deep vein thrombosis)    Reportedly left leg, details not clear   Mixed hyperlipidemia    Type 2 diabetes mellitus (HCC)     Patient Active Problem List   Diagnosis Date Noted   Cellulitis of right upper extremity 12/14/2018   Peripheral arterial disease (HCC) 03/14/2014   CIDP (chronic inflammatory demyelinating polyneuropathy) (HCC) 03/14/2014   Mixed hyperlipidemia 03/12/2014   Essential hypertension, benign 03/12/2014   Uncontrolled type 2 diabetes mellitus with stage 3 chronic kidney disease (HCC) 03/12/2014    Past Surgical History:  Procedure Laterality Date   CHOLECYSTECTOMY     Left quadriceps muscle biopsy  March 2010   Right knee replacement  September 2014     OB History     Gravida  4   Para  4   Term  4   Preterm      AB      Living  4      SAB      IAB      Ectopic      Multiple      Live Births              Family History  Problem Relation Age of Onset   Heart attack Father    Stroke Mother    Cancer Brother     Social History   Tobacco Use   Smoking status: Never   Smokeless tobacco: Never  Vaping  Use   Vaping Use: Never used  Substance Use Topics   Alcohol use: No   Drug use: No    Home Medications Prior to Admission medications   Medication Sig Start Date End Date Taking? Authorizing Provider  acetaminophen (TYLENOL) 325 MG tablet Take 325 mg by mouth every 6 (six) hours as needed for mild pain.    [provider]  calcium-vitamin D (OSCAL WITH D) 500-200 MG-UNIT per tablet Take 1 tablet by mouth at bedtime.  03/14/14   Jonelle Sidle, MD  gabapentin (NEURONTIN) 100 MG capsule Take 100 mg by mouth 3 (three) times daily.  08/30/18   [provider]  JANUVIA 100 MG tablet Take 100 mg by mouth daily. 03/21/20   [provider]  metFORMIN (GLUCOPHAGE-XR) 500 MG 24 hr tablet TAKE 1 TABLET IN THE MORNING WITH FOOD 10/31/20   Roma Kayser, MD  Omega-3 Fatty Acids (FISH OIL) 1200 MG CAPS Take 1 capsule by mouth every morning.     [provider]  predniSONE (DELTASONE) 10  MG tablet Take 10 mg by mouth daily.     [provider]  rivaroxaban (XARELTO) 20 MG TABS tablet Take 20 mg by mouth every morning.    [provider]  rosuvastatin (CRESTOR) 10 MG tablet Take 10 mg by mouth daily. 03/30/19   [provider]  traMADol (ULTRAM) 50 MG tablet Take 50 mg by mouth every 6 (six) hours as needed for moderate pain.     [provider]  vitamin C (ASCORBIC ACID) 500 MG tablet Take 500 mg by mouth daily.    [provider]    Allergies    Amoxicillin-pot clavulanate, Other, and Penicillins  Review of Systems   Review of Systems  Constitutional:  Negative for appetite change and fatigue.  HENT:  Negative for congestion, ear discharge and sinus pressure.   Eyes:  Negative for discharge.  Respiratory:  Negative for cough.   Cardiovascular:  Negative for chest pain.  Gastrointestinal:  Negative for abdominal pain and diarrhea.  Genitourinary:  Negative for frequency and hematuria.  Musculoskeletal:   Negative for back pain.       Left leg pain  Skin:  Negative for rash.  Neurological:  Negative for seizures and headaches.  Psychiatric/Behavioral:  Negative for hallucinations.    Physical Exam Updated Vital Signs BP 137/83 (BP Location: Right Arm)   Pulse 90   Temp 98.3 F (36.8 C) (Oral)   Resp 16   Ht 5\' 6"  (1.676 m)   Wt 67.6 kg   SpO2 97%   BMI 24.05 kg/m   Physical Exam Vitals and nursing note reviewed.  Constitutional:      Appearance: She is well-developed.  HENT:     Head: Normocephalic.     Nose: Nose normal.  Eyes:     General: No scleral icterus.    Conjunctiva/sclera: Conjunctivae normal.  Neck:     Thyroid: No thyromegaly.  Cardiovascular:     Rate and Rhythm: Normal rate and regular rhythm.     Heart sounds: No murmur heard.   No friction rub. No gallop.  Pulmonary:     Breath sounds: No stridor. No wheezing or rales.  Chest:     Chest wall: No tenderness.  Abdominal:     General: There is no distension.     Tenderness: There is no abdominal tenderness. There is no rebound.  Musculoskeletal:        General: Normal range of motion.     Cervical back: Neck supple.     Comments: Patient with pain and bruising to left thigh.  Good femoral pulse.  Lymphadenopathy:     Cervical: No cervical adenopathy.  Skin:    Findings: No erythema or rash.  Neurological:     Mental Status: She is alert and oriented to person, place, and time.     Motor: No abnormal muscle tone.     Coordination: Coordination normal.  Psychiatric:        Behavior: Behavior normal.    ED Results / Procedures / Treatments   Labs (all labs ordered are listed, but only abnormal results are displayed) Labs Reviewed  CBC WITH DIFFERENTIAL/PLATELET - Abnormal; Notable for the following components:      Result Value   WBC 14.8 (*)    Hemoglobin 11.8 (*)    Platelets 128 (*)    Neutro Abs 12.2 (*)    Monocytes Absolute 1.4 (*)    Abs Immature Granulocytes 0.18 (*)    All other  components  within normal limits  COMPREHENSIVE METABOLIC PANEL - Abnormal; Notable for the following components:   Sodium 133 (*)    Glucose, Bld 271 (*)    BUN 47 (*)    Creatinine, Ser 1.50 (*)    Total Protein 5.6 (*)    Albumin 3.4 (*)    Total Bilirubin 1.6 (*)    GFR, Estimated 34 (*)    All other components within normal limits    EKG None  Radiology DG HIP UNILAT WITH PELVIS 2-3 VIEWS LEFT  Result Date: 07/14/2021 CLINICAL DATA:  Hip pain since Saturday, no known injury EXAM: DG HIP (WITH OR WITHOUT PELVIS) 2-3V LEFT COMPARISON:  04/02/2019 FINDINGS: Osseous demineralization. Mild narrowing of the hip joints bilaterally. SI joints preserved. Old fractures of the LEFT superior and inferior pubic rami. No acute fracture, dislocation, or bone destruction. IMPRESSION: Osseous demineralization with old LEFT pubic fractures. No acute osseous abnormalities. Electronically Signed   By: Ulyses Southward M.D.   On: 07/14/2021 12:35    Procedures Procedures   Medications Ordered in ED Medications - No data to display  ED Course  I have reviewed the triage vital signs and the nursing notes.  Pertinent labs & imaging results that were available during my care of the patient were reviewed by me and considered in my medical decision making (see chart for details). Patient with left thigh pain and left hip pain plain films are negative.  We will get an ultrasound look for DVT and possibly CT of the leg   MDM Rules/Calculators/A&P                           Final Clinical Impression(s) / ED Diagnoses Final diagnoses:  Pain    Rx / DC Orders ED Discharge Orders     None        Bethann Berkshire, MD 07/17/21 1043

## 2021-07-14 NOTE — Discharge Instructions (Addendum)
Use heat on sore areas 3-4 times a day.  Follow-up with your neurosurgeon as planned and see your PCP for problems.  Start the prednisone prescription tomorrow.  Continue taking her baseline 10 mg prednisone every day.  Do not use the hydrocodone and tramadol together however you can use them alternately depending on whether you want stronger or less strong narcotic.  The hydrocodone is stronger than the tramadol

## 2021-07-14 NOTE — ED Provider Notes (Signed)
3:50 PM-checkout from Dr. Estell Harpin to evaluate patient after DVT study to evaluate for bruising and pain in left anterior thigh.  5:50 PM.  Patient states no pain at this time.  She states chronic bruising in extremities, that she feels is secondary to her CIDP.  She states she bruises very easily and feels like the bruising of her left thigh is because she had a portable vacuum cleaner sitting on that leg recently.  She saw neurosurgery last week to evaluate for back pain with radicular left leg pain; plans are being made for spinal injection to treat this condition.  No plans for surgery.  Patient takes prednisone chronically 10 mg a day.  She is here with her son, discussed findings with patient and son.  They are agreeable to the plan as stated on the discharge instructions.  She is given a dose of prednisone to start the treatment, will give prescription for prednisone taper, 12-day, and sent prescription for hydrocodone to her pharmacy.  Until then she can double up on her tramadol.  She is instructed that she can use either tramadol or hydrocodone, but not together as needed for pain.  I doubt that she has a DVT or other acute left leg disorder.  Otherwise follow-up with neurosurgery and PCP, as planned and as needed   Mancel Bale, MD 07/14/21 1758

## 2021-07-14 NOTE — ED Notes (Signed)
Unable to obtain BP d/t pain.

## 2021-07-14 NOTE — ED Triage Notes (Signed)
Pt arrived via [facility] with c/o left upper leg pain that started while she was walking yesterday. No known mechanism of injury noted.

## 2021-07-14 NOTE — ED Notes (Signed)
Pt refuses bp cuff at this time as she experiences severe pain d/t neurological dx. Will attempt bp again prior to discharge

## 2021-07-24 ENCOUNTER — Emergency Department (HOSPITAL_COMMUNITY): Payer: Medicare Other

## 2021-07-24 ENCOUNTER — Observation Stay (HOSPITAL_COMMUNITY)
Admission: EM | Admit: 2021-07-24 | Discharge: 2021-07-25 | Disposition: A | Discharge status: Home / Self Care | Payer: Medicare Other | Attending: Family Medicine | Admitting: Family Medicine

## 2021-07-24 ENCOUNTER — Encounter (HOSPITAL_COMMUNITY): Payer: Self-pay

## 2021-07-24 ENCOUNTER — Other Ambulatory Visit: Payer: Self-pay

## 2021-07-24 DIAGNOSIS — G6181 Chronic inflammatory demyelinating polyneuritis: Secondary | ICD-10-CM | POA: Diagnosis present

## 2021-07-24 DIAGNOSIS — M7989 Other specified soft tissue disorders: Principal | ICD-10-CM | POA: Insufficient documentation

## 2021-07-24 DIAGNOSIS — E1165 Type 2 diabetes mellitus with hyperglycemia: Secondary | ICD-10-CM

## 2021-07-24 DIAGNOSIS — Z20822 Contact with and (suspected) exposure to covid-19: Secondary | ICD-10-CM | POA: Diagnosis not present

## 2021-07-24 DIAGNOSIS — I739 Peripheral vascular disease, unspecified: Secondary | ICD-10-CM | POA: Diagnosis present

## 2021-07-24 DIAGNOSIS — I129 Hypertensive chronic kidney disease with stage 1 through stage 4 chronic kidney disease, or unspecified chronic kidney disease: Secondary | ICD-10-CM | POA: Insufficient documentation

## 2021-07-24 DIAGNOSIS — E1122 Type 2 diabetes mellitus with diabetic chronic kidney disease: Secondary | ICD-10-CM | POA: Diagnosis not present

## 2021-07-24 DIAGNOSIS — Z79899 Other long term (current) drug therapy: Secondary | ICD-10-CM | POA: Diagnosis not present

## 2021-07-24 DIAGNOSIS — IMO0002 Reserved for concepts with insufficient information to code with codable children: Secondary | ICD-10-CM | POA: Diagnosis present

## 2021-07-24 DIAGNOSIS — N183 Chronic kidney disease, stage 3 unspecified: Secondary | ICD-10-CM | POA: Insufficient documentation

## 2021-07-24 DIAGNOSIS — I10 Essential (primary) hypertension: Secondary | ICD-10-CM | POA: Diagnosis present

## 2021-07-24 DIAGNOSIS — Z7984 Long term (current) use of oral hypoglycemic drugs: Secondary | ICD-10-CM | POA: Diagnosis not present

## 2021-07-24 DIAGNOSIS — I82409 Acute embolism and thrombosis of unspecified deep veins of unspecified lower extremity: Secondary | ICD-10-CM | POA: Diagnosis present

## 2021-07-24 LAB — CBC WITH DIFFERENTIAL/PLATELET
Abs Immature Granulocytes: 0.28 10*3/uL — ABNORMAL HIGH (ref 0.00–0.07)
Basophils Absolute: 0 10*3/uL (ref 0.0–0.1)
Basophils Relative: 0 %
Eosinophils Absolute: 0 10*3/uL (ref 0.0–0.5)
Eosinophils Relative: 0 %
HCT: 36.6 % (ref 36.0–46.0)
Hemoglobin: 11.8 g/dL — ABNORMAL LOW (ref 12.0–15.0)
Immature Granulocytes: 2 %
Lymphocytes Relative: 6 %
Lymphs Abs: 0.8 10*3/uL (ref 0.7–4.0)
MCH: 30.9 pg (ref 26.0–34.0)
MCHC: 32.2 g/dL (ref 30.0–36.0)
MCV: 95.8 fL (ref 80.0–100.0)
Monocytes Absolute: 0.6 10*3/uL (ref 0.1–1.0)
Monocytes Relative: 5 %
Neutro Abs: 10.2 10*3/uL — ABNORMAL HIGH (ref 1.7–7.7)
Neutrophils Relative %: 87 %
Platelets: 174 10*3/uL (ref 150–400)
RBC: 3.82 MIL/uL — ABNORMAL LOW (ref 3.87–5.11)
RDW: 14.1 % (ref 11.5–15.5)
WBC: 11.9 10*3/uL — ABNORMAL HIGH (ref 4.0–10.5)
nRBC: 0.2 % (ref 0.0–0.2)

## 2021-07-24 LAB — COMPREHENSIVE METABOLIC PANEL
ALT: 18 U/L (ref 0–44)
AST: 17 U/L (ref 15–41)
Albumin: 3.3 g/dL — ABNORMAL LOW (ref 3.5–5.0)
Alkaline Phosphatase: 61 U/L (ref 38–126)
Anion gap: 9 (ref 5–15)
BUN: 39 mg/dL — ABNORMAL HIGH (ref 8–23)
CO2: 25 mmol/L (ref 22–32)
Calcium: 8.9 mg/dL (ref 8.9–10.3)
Chloride: 98 mmol/L (ref 98–111)
Creatinine, Ser: 1.23 mg/dL — ABNORMAL HIGH (ref 0.44–1.00)
GFR, Estimated: 43 mL/min — ABNORMAL LOW (ref >60)
Glucose, Bld: 487 mg/dL — ABNORMAL HIGH (ref 70–99)
Potassium: 5 mmol/L (ref 3.5–5.1)
Sodium: 132 mmol/L — ABNORMAL LOW (ref 135–145)
Total Bilirubin: 2.5 mg/dL — ABNORMAL HIGH (ref 0.3–1.2)
Total Protein: 5.8 g/dL — ABNORMAL LOW (ref 6.5–8.1)

## 2021-07-24 LAB — PROTIME-INR
INR: 1.3 — ABNORMAL HIGH (ref 0.8–1.2)
Prothrombin Time: 16.1 seconds — ABNORMAL HIGH (ref 11.4–15.2)

## 2021-07-24 LAB — HEMOGLOBIN AND HEMATOCRIT, BLOOD
HCT: 35.1 % — ABNORMAL LOW (ref 36.0–46.0)
Hemoglobin: 11.4 g/dL — ABNORMAL LOW (ref 12.0–15.0)

## 2021-07-24 LAB — GLUCOSE, CAPILLARY: Glucose-Capillary: 210 mg/dL — ABNORMAL HIGH (ref 70–99)

## 2021-07-24 LAB — CBG MONITORING, ED: Glucose-Capillary: 294 mg/dL — ABNORMAL HIGH (ref 70–99)

## 2021-07-24 MED ORDER — ONDANSETRON HCL 4 MG/2ML IJ SOLN: 4.0000 mg | Freq: Four times a day (QID) | INTRAMUSCULAR | Status: DC | PRN

## 2021-07-24 MED ORDER — POLYETHYLENE GLYCOL 3350 17 G PO PACK: 17.0000 g | PACK | Freq: Every day | ORAL | Status: DC | PRN

## 2021-07-24 MED ORDER — INSULIN ASPART 100 UNIT/ML IJ SOLN
0.0000 [IU] | Freq: Every day | INTRAMUSCULAR | Status: DC
  Administered 2021-07-24: 2 [IU] via SUBCUTANEOUS

## 2021-07-24 MED ORDER — ACETAMINOPHEN 325 MG PO TABS: 650.0000 mg | ORAL_TABLET | Freq: Four times a day (QID) | ORAL | Status: DC | PRN

## 2021-07-24 MED ORDER — IOHEXOL 300 MG/ML  SOLN
75.0000 mL | Freq: Once | INTRAMUSCULAR | Status: AC | PRN
  Administered 2021-07-24: 75 mL via INTRAVENOUS

## 2021-07-24 MED ORDER — SODIUM CHLORIDE 0.9 % IV BOLUS
500.0000 mL | Freq: Once | INTRAVENOUS | Status: AC
  Administered 2021-07-24: 500 mL via INTRAVENOUS

## 2021-07-24 MED ORDER — GABAPENTIN 100 MG PO CAPS
100.0000 mg | ORAL_CAPSULE | Freq: Three times a day (TID) | ORAL | Status: DC
  Administered 2021-07-24 – 2021-07-25 (×2): 100 mg via ORAL
  Filled 2021-07-24 (×2): qty 1

## 2021-07-24 MED ORDER — LINAGLIPTIN 5 MG PO TABS
5.0000 mg | ORAL_TABLET | Freq: Every day | ORAL | Status: DC
  Administered 2021-07-25: 5 mg via ORAL
  Filled 2021-07-24: qty 1

## 2021-07-24 MED ORDER — ONDANSETRON HCL 4 MG PO TABS: 4.0000 mg | ORAL_TABLET | Freq: Four times a day (QID) | ORAL | Status: DC | PRN

## 2021-07-24 MED ORDER — PREDNISONE 20 MG PO TABS
10.0000 mg | ORAL_TABLET | Freq: Every day | ORAL | Status: DC
  Administered 2021-07-25: 10 mg via ORAL
  Filled 2021-07-24: qty 1

## 2021-07-24 MED ORDER — ROSUVASTATIN CALCIUM 10 MG PO TABS
10.0000 mg | ORAL_TABLET | Freq: Every day | ORAL | Status: DC
  Administered 2021-07-25: 10 mg via ORAL
  Filled 2021-07-24: qty 1

## 2021-07-24 MED ORDER — INSULIN ASPART 100 UNIT/ML IJ SOLN
0.0000 [IU] | Freq: Three times a day (TID) | INTRAMUSCULAR | Status: DC
  Administered 2021-07-24: 8 [IU] via SUBCUTANEOUS
  Administered 2021-07-25 (×2): 5 [IU] via SUBCUTANEOUS

## 2021-07-24 MED ORDER — HYDROCODONE-ACETAMINOPHEN 5-325 MG PO TABS: 1.0000 | ORAL_TABLET | Freq: Four times a day (QID) | ORAL | Status: DC | PRN

## 2021-07-24 MED ORDER — ACETAMINOPHEN 650 MG RE SUPP: 650.0000 mg | Freq: Four times a day (QID) | RECTAL | Status: DC | PRN

## 2021-07-24 NOTE — ED Triage Notes (Signed)
Patient here with edema to bilateral lower extremities. LLE worse than right with large area of swelling to left calf with discoloration. Edema pitting to both legs and feet. States that she has decreased sensation due to recent epidural and DM. Currently takes Xarelto for hx of DVT.

## 2021-07-24 NOTE — ED Notes (Signed)
ED Provider at bedside. 

## 2021-07-24 NOTE — ED Notes (Signed)
Report received from [facility]; bedside ultrasound being performed

## 2021-07-24 NOTE — H&P (Signed)
History and Physical    Linda Hess GUR:[id number] DOB: February 12, 1931 DOA: 07/24/2021  PCP: Smith Robert, MD   Patient coming from: Home  I have personally briefly reviewed patient's old medical records in [facility]  Chief Complaint: Leg swelling and discoloration  HPI: Linda Hess is a 87 y.o. female with medical history significant for HTN, DM, CIDP.  Patient presented to the ED with complaints of swelling and redness involving her left lower extremity, particularly in the back of her knees that was noticed about 5 days ago.   Patient's son is present at bedside and assists with the history.  Patient uses a wheelchair, patient's outpatient provider, and is thought that the pressure from the wheelchair may have caused trauma to her left posterior knee.  She otherwise denies known trauma to the area, no falls.  No fevers no chills.  No dizziness no chest pain. Patient was in the ED 7/18 for back pain and some bruising to left posterior thigh. Was discharged with prednisone taper.  But son says discoloration started after that ED visit. She was referred to a pain specialist and has had epidural injection for her back pain and this has significantly improved her pain.  ED Course: Blood pressure systolic 130s to 283T.  Heart rate mostly 50s to 60s.  Hemoglobin 11.8.  Lower extremity ultrasound negative for DVT, suggest hematoma.  Subsequent CT of left lower extremity-heterogeneous collection in distal iliopsoas muscle and abductor longus muscles, hematoma is favored, infection less likely. (Pls see CT scan for full details). Hospitalist to admit.  Review of Systems: As per HPI all other systems reviewed and negative.  Past Medical History:  Diagnosis Date   CIDP (chronic inflammatory demyelinating polyneuropathy) (HCC)    Essential hypertension, benign    pt denies   History of DVT (deep vein thrombosis)    Reportedly left leg, details not clear   Mixed hyperlipidemia     Type 2 diabetes mellitus (HCC)     Past Surgical History:  Procedure Laterality Date   CHOLECYSTECTOMY     Left quadriceps muscle biopsy  March 2010   Right knee replacement  September 2014     reports that she has never smoked. She has never used smokeless tobacco. She reports that she does not drink alcohol and does not use drugs.  Allergies  Allergen Reactions   Amoxicillin-Pot Clavulanate Hives   Other Hives and Itching    RED MEAT   Penicillins     hives    Family History  Problem Relation Age of Onset   Heart attack Father    Stroke Mother    Cancer Brother    Prior to Admission medications   Medication Sig Start Date End Date Taking? Authorizing Provider  acetaminophen (TYLENOL) 325 MG tablet Take 325 mg by mouth every 6 (six) hours as needed for mild pain.   Yes [provider]  calcium-vitamin D (OSCAL WITH D) 500-200 MG-UNIT per tablet Take 1 tablet by mouth at bedtime.  03/14/14  Yes Jonelle Sidle, MD  gabapentin (NEURONTIN) 100 MG capsule Take 100 mg by mouth 3 (three) times daily.  08/30/18  Yes [provider]  HYDROcodone-acetaminophen (NORCO) 5-325 MG tablet Take 1 tablet by mouth every 4 (four) hours as needed. 07/14/21  Yes Mancel Bale, MD  JANUVIA 100 MG tablet Take 100 mg by mouth daily. 03/21/20  Yes [provider]  metFORMIN (GLUCOPHAGE-XR) 500 MG 24 hr tablet TAKE 1 TABLET IN THE  MORNING WITH FOOD Patient taking differently: Take 500 mg by mouth daily with breakfast. 10/31/20  Yes Nida, Denman George, MD  Omega-3 Fatty Acids (FISH OIL) 1200 MG CAPS Take 1 capsule by mouth every morning.    Yes [provider]  predniSONE (DELTASONE) 10 MG tablet Take 10 mg by mouth daily.    Yes [provider]  rivaroxaban (XARELTO) 20 MG TABS tablet Take 20 mg by mouth every morning.   Yes [provider]  rosuvastatin (CRESTOR) 10 MG tablet Take 10 mg by mouth daily. 03/30/19  Yes [provider]   traMADol (ULTRAM) 50 MG tablet Take 50 mg by mouth every 6 (six) hours as needed for moderate pain.    Yes [provider]  vitamin C (ASCORBIC ACID) 500 MG tablet Take 500 mg by mouth daily.   Yes [provider]  predniSONE (DELTASONE) 10 MG tablet Take 1 day 6,6,5,5,4,4,3,3,2,2,1,1 Patient not taking: Reported on 07/24/2021 07/14/21   Mancel Bale, MD    Physical Exam: Vitals:   07/24/21 1515 07/24/21 1700 07/24/21 1715 07/24/21 1800  BP:   136/74 (!) 150/68  Pulse: (!) 59 62  66  Resp: Temp:    97.6 F (36.4 C)  TempSrc:    Oral  SpO2: 100% 100%  100%  Weight:    67.9 kg  Height:     (1.676 m)    Constitutional: NAD, calm, comfortable Vitals:   07/24/21 1515 07/24/21 1700 07/24/21 1715 07/24/21 1800  BP:   136/74 (!) 150/68  Pulse: (!) 59 62  66  Resp: Temp:    97.6 F (36.4 C)  TempSrc:    Oral  SpO2: 100% 100%  100%  Weight:    67.9 kg  Height:     (1.676 m)   Eyes: PERRL, lids and conjunctivae normal ENMT: Mucous membranes are dry  Neck: normal, supple, no masses, no thyromegaly Respiratory: clear to auscultation bilaterally, no wheezing, no crackles. Normal respiratory effort. No accessory muscle use.  Cardiovascular: Regular rate and rhythm, no murmurs / rubs / gallops.  Swelling to bilateral lower extremities worse on the left. 2+ pedal pulses.  Abdomen: no tenderness, no masses palpated. No hepatosplenomegaly. Bowel sounds positive.  Musculoskeletal: no clubbing / cyanosis. No joint deformity upper and lower extremities. Good ROM, no contractures. Normal muscle tone.  Skin: Significant discoloration to left lower extremity, obvious large areas of ecchymosis, with subcutaneous bleeding and collections into the posterior cubital fossa, extending superiorly into thighs and inferiorly into calf area.  Purpuric and ecchymotic areas to bilateral upper extremities also. Neurologic: No apparent cranial formality, moving  extremities spontaneously.  Psychiatric: Normal judgment and insight. Alert and oriented x 3. Normal mood.   Labs on Admission: I have personally reviewed following labs and imaging studies  CBC: Recent Labs  Lab 07/24/21 1005  WBC 11.9*  NEUTROABS 10.2*  HGB 11.8*  HCT 36.6  MCV 95.8  PLT 174   Basic Metabolic Panel: Recent Labs  Lab 07/24/21 1005  NA 132*  K 5.0  CL 98  CO2 25  GLUCOSE 487*  BUN 39*  CREATININE 1.23*  CALCIUM 8.9   Liver Function Tests: Recent Labs  Lab 07/24/21 1005  AST 17  ALT 18  ALKPHOS 61  BILITOT 2.5*  PROT 5.8*  ALBUMIN 3.3*   CBG: Recent Labs  Lab 07/24/21 1548  GLUCAP 294*    Radiological Exams on Admission:  CT TIBIA FIBULA LEFT W CONTRAST  Result Date: 07/24/2021 CLINICAL DATA:  Soft tissue infection suspected, hip, xray done EXAM: CT OF THE LOWER RIGHT EXTREMITY WITH CONTRAST TECHNIQUE: Multidetector CT imaging of the lower right extremity including the left femur, left tibia and fibula, and left foot, was performed according to the standard protocol following intravenous contrast administration. CONTRAST:  41mL OMNIPAQUE IOHEXOL 300 MG/ML  SOLN COMPARISON:  Same day ultrasound, left hip MRI 04/26/2019 FINDINGS: Bones/Joint/Cartilage There are chronic fractures of the left superior and inferior pubic rami. There is no evidence of left femur fracture. 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 There is no evidence of acute fracture. 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 There is moderate tibiotalar osteoarthritis and degenerative changes throughout the foot. Diffuse osteopenia. There is no osseous destruction or periostitis to suggest osteomyelitis. Partially visualized right knee arthroplasty hardware. Muscles and Tendons There is a heterogeneous collection along the iliopsoas muscle of the left hip, with heterotopic ossification, measuring up to  5.0 x 3.3 cm in the axial dimension. There is also a heterogeneous collection extending within the abductor longus muscle measuring up to 4.2 x 3.4 cm in the axial dimension. There is moderate-severe atrophy of the lower leg musculature, and a intramuscular lipoma involving the medial head gastrocnemius measuring 4.1 x 1.7 cm. Soft tissues There is a coarse calcification along the lateral soft tissues at the level of the distal femur, which is favored to represent sequela of prior chronic hematoma. Massive localized lymphedema of the upper inner thigh. There is extensive soft tissue swelling of the left lower extremity with skin thickening, relatively sparing the anterior thigh. There is irregular skin thickening with subcutaneous water density collection which extends along the skin surface of the posterior distal thigh and posteriorly along the knee. There is diffuse skin thickening and soft tissue swelling of the lower leg as well extending through the ankle and foot. There is no soft tissue gas. Partially visualized low right lower extremity edema. Partially visualized pelvic structures are unremarkable. IMPRESSION: Heterogeneous collections along the distal iliopsoas muscle and abductor longus muscles measuring up to 5.0 and 4.2 cm short axis respectively, extending along the length of the muscles. These are favored to represent hematomas, however infection cannot be completely excluded. Extensive skin thickening and subcutaneous soft tissue swelling involving the thigh, lower leg, ankle and foot as can be seen in lymphedema or cellulitis. No evidence of osteomyelitis. No deep intramuscular compartment involvement of the lower leg. Massive localized lymphedema along the upper inner thigh. The extensive complex collection along the posterior aspect of the knee superficially recently evaluated on ultrasound is also favored to represent massive localized lymphedema. Electronically Signed   By: Caprice Renshaw   On:  07/24/2021 14:28   CT FEMUR LEFT W CONTRAST  Result Date: 07/24/2021 CLINICAL DATA:  Soft tissue infection suspected, hip, xray done EXAM: CT OF THE LOWER RIGHT EXTREMITY WITH CONTRAST TECHNIQUE: Multidetector CT imaging of the lower right extremity including the left femur, left tibia and fibula, and left foot, was performed according to the standard protocol following intravenous contrast administration. CONTRAST:  35mL OMNIPAQUE IOHEXOL 300 MG/ML  SOLN COMPARISON:  Same day ultrasound, left hip MRI 04/26/2019 FINDINGS: Bones/Joint/Cartilage There are chronic fractures of the left superior and inferior pubic rami. There is no evidence of left femur fracture. 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 There is no evidence of acute fracture. There is tricompartment knee arthritis,  with moderate-severe medial compartment joint space narrowing and trace joint effusion. There is moderate tibiotalar osteoarthritis and degenerative changes throughout the foot. Diffuse osteopenia. There is no osseous destruction or periostitis to suggest osteomyelitis. Partially visualized right knee arthroplasty hardware. Muscles and Tendons There is a heterogeneous collection along the iliopsoas muscle of the left hip, with heterotopic ossification, measuring up to 5.0 x 3.3 cm in the axial dimension. There is also a heterogeneous collection extending within the abductor longus muscle measuring up to 4.2 x 3.4 cm in the axial dimension. There is moderate-severe atrophy of the lower leg musculature, and a intramuscular lipoma involving the medial head gastrocnemius measuring 4.1 x 1.7 cm. Soft tissues There is a coarse calcification along the lateral soft tissues at the level of the distal femur, which is favored to represent sequela of prior chronic hematoma. Massive localized lymphedema of the upper inner thigh. There is extensive soft tissue swelling of the left lower extremity with skin thickening, relatively sparing the anterior  thigh. There is irregular skin thickening with subcutaneous water density collection which extends along the skin surface of the posterior distal thigh and posteriorly along the knee. There is diffuse skin thickening and soft tissue swelling of the lower leg as well extending through the ankle and foot. There is no soft tissue gas. Partially visualized low right lower extremity edema. Partially visualized pelvic structures are unremarkable. IMPRESSION: Heterogeneous collections along the distal iliopsoas muscle and abductor longus muscles measuring up to 5.0 and 4.2 cm short axis respectively, extending along the length of the muscles. These are favored to represent hematomas, however infection cannot be completely excluded. Extensive skin thickening and subcutaneous soft tissue swelling involving the thigh, lower leg, ankle and foot as can be seen in lymphedema or cellulitis. No evidence of osteomyelitis. No deep intramuscular compartment involvement of the lower leg. Massive localized lymphedema along the upper inner thigh. The extensive complex collection along the posterior aspect of the knee superficially recently evaluated on ultrasound is also favored to represent massive localized lymphedema. Electronically Signed   By: Caprice RenshawJacob  Kahn   On: 07/24/2021 14:28   CT FOOT LEFT W CONTRAST  Result Date: 07/24/2021 CLINICAL DATA:  Soft tissue infection suspected, hip, xray done EXAM: CT OF THE LOWER RIGHT EXTREMITY WITH CONTRAST TECHNIQUE: Multidetector CT imaging of the lower right extremity including the left femur, left tibia and fibula, and left foot, was performed according to the standard protocol following intravenous contrast administration. CONTRAST:  75mL OMNIPAQUE IOHEXOL 300 MG/ML  SOLN COMPARISON:  Same day ultrasound, left hip MRI 04/26/2019 FINDINGS: Bones/Joint/Cartilage There are chronic fractures of the left superior and inferior pubic rami. There is no evidence of left femur fracture. There is  moderate left hip osteoarthritis. There is no evidence of acute fracture. 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 There is moderate tibiotalar osteoarthritis and degenerative changes throughout the foot. Diffuse osteopenia. There is no osseous destruction or periostitis to suggest osteomyelitis. Partially visualized right knee arthroplasty hardware. Muscles and Tendons There is a heterogeneous collection along the iliopsoas muscle of the left hip, with heterotopic ossification, measuring up to 5.0 x 3.3 cm in the axial dimension. There is also a heterogeneous collection extending within the abductor longus muscle measuring up to 4.2 x 3.4 cm in the axial dimension. There is moderate-severe atrophy of the lower leg musculature, and a intramuscular lipoma involving the medial head gastrocnemius measuring 4.1 x 1.7 cm. Soft tissues  There is a coarse calcification along the lateral soft tissues at the level of the distal femur, which is favored to represent sequela of prior chronic hematoma. Massive localized lymphedema of the upper inner thigh. There is extensive soft tissue swelling of the left lower extremity with skin thickening, relatively sparing the anterior thigh. There is irregular skin thickening with subcutaneous water density collection which extends along the skin surface of the posterior distal thigh and posteriorly along the knee. There is diffuse skin thickening and soft tissue swelling of the lower leg as well extending through the ankle and foot. There is no soft tissue gas. Partially visualized low right lower extremity edema. Partially visualized pelvic structures are unremarkable. IMPRESSION: Heterogeneous collections along the distal iliopsoas muscle and abductor longus muscles measuring up to 5.0 and 4.2 cm short axis respectively, extending along the length of the muscles. These are favored to represent  hematomas, however infection cannot be completely excluded. Extensive skin thickening and subcutaneous soft tissue swelling involving the thigh, lower leg, ankle and foot as can be seen in lymphedema or cellulitis. No evidence of osteomyelitis. No deep intramuscular compartment involvement of the lower leg. Massive localized lymphedema along the upper inner thigh. The extensive complex collection along the posterior aspect of the knee superficially recently evaluated on ultrasound is also favored to represent massive localized lymphedema. Electronically Signed   By: Caprice Renshaw   On: 07/24/2021 14:28   US Venous Img Lower Unilateral Left  Result Date: 07/24/2021 CLINICAL DATA:  87 year old female with left lower extremity pain and edema for 1 week EXAM: LEFT LOWER EXTREMITY VENOUS DOPPLER ULTRASOUND TECHNIQUE: Gray-scale sonography with compression, as well as color and duplex ultrasound, were performed to evaluate the deep venous system(s) from the level of the common femoral vein through the popliteal and proximal calf veins. COMPARISON:  None. FINDINGS: VENOUS Normal compressibility of the common femoral, superficial femoral, and popliteal veins, as well as the visualized calf veins. Visualized portions of profunda femoral vein and great saphenous vein unremarkable. No filling defects to suggest DVT on grayscale or color Doppler imaging. Doppler waveforms show normal direction of venous flow, normal respiratory plasticity and response to augmentation. Limited views of the contralateral common femoral vein are unremarkable. OTHER Large left superficial inguinal lymph node measuring 7.9 x 2.6 x 4.7 cm. The cortex is diffusely thickened. Additionally, there is a large highly complex spongiform cystic mass in the superficial soft tissues of the popliteal fossa just deep to the skin surface. The mass measures approximately 8.7 x 2.5 x 8.3 cm. No definite internal vascularity. Limitations: none IMPRESSION: 1. No  evidence of deep venous thrombosis in the left lower extremity. 2. Prominent left inguinal lymphadenopathy may be reactive or reflective of an underlying lymphoproliferative process. Recommend clinical correlation for evidence of additional sites of lymphadenopathy. 3. Large complex spongiform mass in the superficial aspect of the popliteal fossa. The imaging appearance is nonspecific. Differential considerations include hematoma, highly complex Baker's cyst, and potentially a soft tissue neoplasm such as a soft tissue sarcoma. The imaging appearance would be atypical for a Baker's cyst. If the patient has a recent history of injury or muscle strain and there is visible bruising, then hematoma would be favored. If the lesion is palpable, and firm, then concern for soft tissue sarcoma would be elevated. If further imaging is clinically warranted, MRI of the knee with and without gadolinium contrast would be the test of choice. Electronically Signed   By: Isac Caddy.D.  On: 07/24/2021 12:36    EKG: EKG   Assessment/Plan Principal Problem:   Leg swelling Active Problems:   Essential hypertension, benign   Uncontrolled type 2 diabetes mellitus with stage 3 chronic kidney disease (HCC)   Peripheral arterial disease (HCC)   CIDP (chronic inflammatory demyelinating polyneuropathy) (HCC)   DVT (deep venous thrombosis) (HCC)   Hematomas involving the left lower extremities, onset about 5 days ago.  Last dose of Xarelto was this morning.  Considering location and absence of obvious trauma, etiology of hematoma- likely pressure of patient's wheelchair.  Vitals stable at this time. -Hold Xarelto - Monitor hemoglobin closely -PT/INR  DVT x 2 - 2017, unprovoked, with persistent risk factors, patient is not active. -Hold Xarelto for now  CIDP on chronic prednisone 10mg  daily -Resume  Uncontrolled diabetes mellitus with hyperglycemia random glucose 487 > 294.  A1c 8.5.  On chronic steroids. -  SSI- M -Resume Januvia, -Hold metformin  DVT prophylaxis: SCds Code Status: Full code, confirmed with patient and son Rickey at bedside. Family Communication: Son 04-25-1995 at bedside. Disposition Plan: ~ 1 - 2 days Consults called: None Admission status: Obs, tele   Clide Cliff MD [facility]  07/24/2021, 7:46 PM

## 2021-07-24 NOTE — ED Notes (Signed)
Put pillow under pt lef

## 2021-07-24 NOTE — ED Provider Notes (Signed)
[facility] EMERGENCY DEPARTMENT Provider Note   CSN: [id number] Arrival date & time: 07/24/21  6226     History Chief Complaint  Patient presents with   Leg Swelling    Linda Hess is a 87 y.o. female.  Patient presents with severely swollen left leg and bruising for the last couple weeks  The history is provided by the patient and medical records. No language interpreter was used.  Leg Pain Location:  Leg Injury: no   Leg location:  L leg Pain details:    Quality:  Aching   Radiates to:  Does not radiate   Severity:  Moderate   Onset quality:  Sudden   Timing:  Constant   Progression:  Worsening Chronicity:  New Dislocation: no   Foreign body present:  No foreign bodies Relieved by:  Nothing Associated symptoms: no back pain and no fatigue       Past Medical History:  Diagnosis Date   CIDP (chronic inflammatory demyelinating polyneuropathy) (HCC)    Essential hypertension, benign    pt denies   History of DVT (deep vein thrombosis)    Reportedly left leg, details not clear   Mixed hyperlipidemia    Type 2 diabetes mellitus (HCC)     Patient Active Problem List   Diagnosis Date Noted   Cellulitis of right upper extremity 12/14/2018   Peripheral arterial disease (HCC) 03/14/2014   CIDP (chronic inflammatory demyelinating polyneuropathy) (HCC) 03/14/2014   Mixed hyperlipidemia 03/12/2014   Essential hypertension, benign 03/12/2014   Uncontrolled type 2 diabetes mellitus with stage 3 chronic kidney disease (HCC) 03/12/2014    Past Surgical History:  Procedure Laterality Date   CHOLECYSTECTOMY     Left quadriceps muscle biopsy  March 2010   Right knee replacement  September 2014     OB History     Gravida  4   Para  4   Term  4   Preterm      AB      Living  4      SAB      IAB      Ectopic      Multiple      Live Births              Family History  Problem Relation Age of Onset   Heart attack Father    Stroke  Mother    Cancer Brother     Social History   Tobacco Use   Smoking status: Never   Smokeless tobacco: Never  Vaping Use   Vaping Use: Never used  Substance Use Topics   Alcohol use: No   Drug use: No    Home Medications Prior to Admission medications   Medication Sig Start Date End Date Taking? Authorizing Provider  acetaminophen (TYLENOL) 325 MG tablet Take 325 mg by mouth every 6 (six) hours as needed for mild pain.   Yes [provider]  calcium-vitamin D (OSCAL WITH D) 500-200 MG-UNIT per tablet Take 1 tablet by mouth at bedtime.  03/14/14  Yes Jonelle Sidle, MD  gabapentin (NEURONTIN) 100 MG capsule Take 100 mg by mouth 3 (three) times daily.  08/30/18  Yes [provider]  HYDROcodone-acetaminophen (NORCO) 5-325 MG tablet Take 1 tablet by mouth every 4 (four) hours as needed. 07/14/21  Yes Mancel Bale, MD  JANUVIA 100 MG tablet Take 100 mg by mouth daily. 03/21/20  Yes [provider]  metFORMIN (GLUCOPHAGE-XR) 500 MG 24 hr tablet TAKE  1 TABLET IN THE MORNING WITH FOOD Patient taking differently: Take 500 mg by mouth daily with breakfast. 10/31/20  Yes Nida, Denman George, MD  Omega-3 Fatty Acids (FISH OIL) 1200 MG CAPS Take 1 capsule by mouth every morning.    Yes [provider]  predniSONE (DELTASONE) 10 MG tablet Take 10 mg by mouth daily.    Yes [provider]  rivaroxaban (XARELTO) 20 MG TABS tablet Take 20 mg by mouth every morning.   Yes [provider]  rosuvastatin (CRESTOR) 10 MG tablet Take 10 mg by mouth daily. 03/30/19  Yes [provider]  traMADol (ULTRAM) 50 MG tablet Take 50 mg by mouth every 6 (six) hours as needed for moderate pain.    Yes [provider]  vitamin C (ASCORBIC ACID) 500 MG tablet Take 500 mg by mouth daily.   Yes [provider]  predniSONE (DELTASONE) 10 MG tablet Take 1 day 6,6,5,5,4,4,3,3,2,2,1,1 Patient not taking: Reported on 07/24/2021 07/14/21   Mancel Bale, MD    Allergies    Amoxicillin-pot clavulanate, Other, and Penicillins  Review of Systems   Review of Systems  Constitutional:  Negative for appetite change and fatigue.  HENT:  Negative for congestion, ear discharge and sinus pressure.   Eyes:  Negative for discharge.  Respiratory:  Negative for cough.   Cardiovascular:  Negative for chest pain.  Gastrointestinal:  Negative for abdominal pain and diarrhea.  Genitourinary:  Negative for frequency and hematuria.  Musculoskeletal:  Negative for back pain.       Left leg pain  Skin:  Negative for rash.  Neurological:  Negative for seizures and headaches.  Psychiatric/Behavioral:  Negative for hallucinations.    Physical Exam Updated Vital Signs BP (!) 155/88   Pulse (!) 59   Temp (!) 97.3 F (36.3 C)   Resp 14   Ht  (1.676 m)   Wt 67.6 kg   SpO2 100%   BMI 24.05 kg/m   Physical Exam Vitals reviewed.  Constitutional:      Appearance: Linda Hess is well-developed.  HENT:     Head: Normocephalic.     Nose: Nose normal.  Eyes:     General: No scleral icterus.    Conjunctiva/sclera: Conjunctivae normal.  Neck:     Thyroid: No thyromegaly.  Cardiovascular:     Rate and Rhythm: Normal rate and regular rhythm.     Heart sounds: No murmur heard.   No friction rub. No gallop.  Pulmonary:     Breath sounds: No stridor. No wheezing or rales.  Chest:     Chest wall: No tenderness.  Abdominal:     General: There is no distension.     Tenderness: There is no abdominal tenderness. There is no rebound.  Musculoskeletal:     Cervical back: Neck supple.     Comments: Significant edema and bruising and tenderness left lower leg  Lymphadenopathy:     Cervical: No cervical adenopathy.  Skin:    Findings: No erythema or rash.  Neurological:     Mental Status: Linda Hess is oriented to person, place, and time.     Motor: No abnormal muscle tone.     Coordination: Coordination normal.  Psychiatric:        Behavior: Behavior  normal.    ED Results / Procedures / Treatments   Labs (all labs ordered are listed, but only abnormal results are displayed) Labs Reviewed  CBC WITH DIFFERENTIAL/PLATELET - Abnormal; Notable for the following components:  Result Value   WBC 11.9 (*)    RBC 3.82 (*)    Hemoglobin 11.8 (*)    Neutro Abs 10.2 (*)    Abs Immature Granulocytes 0.28 (*)    All other components within normal limits  COMPREHENSIVE METABOLIC PANEL - Abnormal; Notable for the following components:   Sodium 132 (*)    Glucose, Bld 487 (*)    BUN 39 (*)    Creatinine, Ser 1.23 (*)    Total Protein 5.8 (*)    Albumin 3.3 (*)    Total Bilirubin 2.5 (*)    GFR, Estimated 43 (*)    All other components within normal limits    EKG None  Radiology CT TIBIA FIBULA LEFT W CONTRAST  Result Date: 07/24/2021 CLINICAL DATA:  Soft tissue infection suspected, hip, xray done EXAM: CT OF THE LOWER RIGHT EXTREMITY WITH CONTRAST TECHNIQUE: Multidetector CT imaging of the lower right extremity including the left femur, left tibia and fibula, and left foot, was performed according to the standard protocol following intravenous contrast administration. CONTRAST:  42mL OMNIPAQUE IOHEXOL 300 MG/ML  SOLN COMPARISON:  Same day ultrasound, left hip MRI 04/26/2019 FINDINGS: Bones/Joint/Cartilage There are chronic fractures of the left superior and inferior pubic rami. There is no evidence of left femur fracture. 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 There is no evidence of acute fracture. 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 There is moderate tibiotalar osteoarthritis and degenerative changes throughout the foot. Diffuse osteopenia. There is no osseous destruction or periostitis to suggest osteomyelitis. Partially visualized right knee arthroplasty hardware. Muscles and Tendons There is a heterogeneous collection along the iliopsoas muscle  of the left hip, with heterotopic ossification, measuring up to 5.0 x 3.3 cm in the axial dimension. There is also a heterogeneous collection extending within the abductor longus muscle measuring up to 4.2 x 3.4 cm in the axial dimension. There is moderate-severe atrophy of the lower leg musculature, and a intramuscular lipoma involving the medial head gastrocnemius measuring 4.1 x 1.7 cm. Soft tissues There is a coarse calcification along the lateral soft tissues at the level of the distal femur, which is favored to represent sequela of prior chronic hematoma. Massive localized lymphedema of the upper inner thigh. There is extensive soft tissue swelling of the left lower extremity with skin thickening, relatively sparing the anterior thigh. There is irregular skin thickening with subcutaneous water density collection which extends along the skin surface of the posterior distal thigh and posteriorly along the knee. There is diffuse skin thickening and soft tissue swelling of the lower leg as well extending through the ankle and foot. There is no soft tissue gas. Partially visualized low right lower extremity edema. Partially visualized pelvic structures are unremarkable. IMPRESSION: Heterogeneous collections along the distal iliopsoas muscle and abductor longus muscles measuring up to 5.0 and 4.2 cm short axis respectively, extending along the length of the muscles. These are favored to represent hematomas, however infection cannot be completely excluded. Extensive skin thickening and subcutaneous soft tissue swelling involving the thigh, lower leg, ankle and foot as can be seen in lymphedema or cellulitis. No evidence of osteomyelitis. No deep intramuscular compartment involvement of the lower leg. Massive localized lymphedema along the upper inner thigh. The extensive complex collection along the posterior aspect of the knee superficially recently evaluated on ultrasound is also favored to represent massive  localized lymphedema. Electronically Signed   By: Caprice Renshaw  On: 07/24/2021 14:28   CT FEMUR LEFT W CONTRAST  Result Date: 07/24/2021 CLINICAL DATA:  Soft tissue infection suspected, hip, xray done EXAM: CT OF THE LOWER RIGHT EXTREMITY WITH CONTRAST TECHNIQUE: Multidetector CT imaging of the lower right extremity including the left femur, left tibia and fibula, and left foot, was performed according to the standard protocol following intravenous contrast administration. CONTRAST:  75mL OMNIPAQUE IOHEXOL 300 MG/ML  SOLN COMPARISON:  Same day ultrasound, left hip MRI 04/26/2019 FINDINGS: Bones/Joint/Cartilage There are chronic fractures of the left superior and inferior pubic rami. There is no evidence of left femur fracture. 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 There is no evidence of acute fracture. 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 There is moderate tibiotalar osteoarthritis and degenerative changes throughout the foot. Diffuse osteopenia. There is no osseous destruction or periostitis to suggest osteomyelitis. Partially visualized right knee arthroplasty hardware. Muscles and Tendons There is a heterogeneous collection along the iliopsoas muscle of the left hip, with heterotopic ossification, measuring up to 5.0 x 3.3 cm in the axial dimension. There is also a heterogeneous collection extending within the abductor longus muscle measuring up to 4.2 x 3.4 cm in the axial dimension. There is moderate-severe atrophy of the lower leg musculature, and a intramuscular lipoma involving the medial head gastrocnemius measuring 4.1 x 1.7 cm. Soft tissues There is a coarse calcification along the lateral soft tissues at the level of the distal femur, which is favored to represent sequela of prior chronic hematoma. Massive localized lymphedema of the upper inner thigh. There is extensive soft tissue swelling of the left  lower extremity with skin thickening, relatively sparing the anterior thigh. There is irregular skin thickening with subcutaneous water density collection which extends along the skin surface of the posterior distal thigh and posteriorly along the knee. There is diffuse skin thickening and soft tissue swelling of the lower leg as well extending through the ankle and foot. There is no soft tissue gas. Partially visualized low right lower extremity edema. Partially visualized pelvic structures are unremarkable. IMPRESSION: Heterogeneous collections along the distal iliopsoas muscle and abductor longus muscles measuring up to 5.0 and 4.2 cm short axis respectively, extending along the length of the muscles. These are favored to represent hematomas, however infection cannot be completely excluded. Extensive skin thickening and subcutaneous soft tissue swelling involving the thigh, lower leg, ankle and foot as can be seen in lymphedema or cellulitis. No evidence of osteomyelitis. No deep intramuscular compartment involvement of the lower leg. Massive localized lymphedema along the upper inner thigh. The extensive complex collection along the posterior aspect of the knee superficially recently evaluated on ultrasound is also favored to represent massive localized lymphedema. Electronically Signed   By: Caprice Renshaw   On: 07/24/2021 14:28   CT FOOT LEFT W CONTRAST  Result Date: 07/24/2021 CLINICAL DATA:  Soft tissue infection suspected, hip, xray done EXAM: CT OF THE LOWER RIGHT EXTREMITY WITH CONTRAST TECHNIQUE: Multidetector CT imaging of the lower right extremity including the left femur, left tibia and fibula, and left foot, was performed according to the standard protocol following intravenous contrast administration. CONTRAST:  75mL OMNIPAQUE IOHEXOL 300 MG/ML  SOLN COMPARISON:  Same day ultrasound, left hip MRI 04/26/2019 FINDINGS: Bones/Joint/Cartilage There are chronic fractures of the left superior and  inferior pubic rami. There is no evidence of left femur fracture. 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 There is no evidence of acute fracture. There  is tricompartment knee arthritis, with moderate-severe medial compartment joint space narrowing and trace joint effusion. There is moderate tibiotalar osteoarthritis and degenerative changes throughout the foot. Diffuse osteopenia. There is no osseous destruction or periostitis to suggest osteomyelitis. Partially visualized right knee arthroplasty hardware. Muscles and Tendons There is a heterogeneous collection along the iliopsoas muscle of the left hip, with heterotopic ossification, measuring up to 5.0 x 3.3 cm in the axial dimension. There is also a heterogeneous collection extending within the abductor longus muscle measuring up to 4.2 x 3.4 cm in the axial dimension. There is moderate-severe atrophy of the lower leg musculature, and a intramuscular lipoma involving the medial head gastrocnemius measuring 4.1 x 1.7 cm. Soft tissues There is a coarse calcification along the lateral soft tissues at the level of the distal femur, which is favored to represent sequela of prior chronic hematoma. Massive localized lymphedema of the upper inner thigh. There is extensive soft tissue swelling of the left lower extremity with skin thickening, relatively sparing the anterior thigh. There is irregular skin thickening with subcutaneous water density collection which extends along the skin surface of the posterior distal thigh and posteriorly along the knee. There is diffuse skin thickening and soft tissue swelling of the lower leg as well extending through the ankle and foot. There is no soft tissue gas. Partially visualized low right lower extremity edema. Partially visualized pelvic structures are unremarkable. IMPRESSION: Heterogeneous collections along the distal iliopsoas muscle and abductor longus muscles measuring up to 5.0 and 4.2 cm short axis respectively,  extending along the length of the muscles. These are favored to represent hematomas, however infection cannot be completely excluded. Extensive skin thickening and subcutaneous soft tissue swelling involving the thigh, lower leg, ankle and foot as can be seen in lymphedema or cellulitis. No evidence of osteomyelitis. No deep intramuscular compartment involvement of the lower leg. Massive localized lymphedema along the upper inner thigh. The extensive complex collection along the posterior aspect of the knee superficially recently evaluated on ultrasound is also favored to represent massive localized lymphedema. Electronically Signed   By: Caprice RenshawJacob  Kahn   On: 07/24/2021 14:28   US Venous Img Lower Unilateral Left  Result Date: 07/24/2021 CLINICAL DATA:  [age] year old female with left lower extremity pain and edema for 1 week EXAM: LEFT LOWER EXTREMITY VENOUS DOPPLER ULTRASOUND TECHNIQUE: Gray-scale sonography with compression, as well as color and duplex ultrasound, were performed to evaluate the deep venous system(s) from the level of the common femoral vein through the popliteal and proximal calf veins. COMPARISON:  None. FINDINGS: VENOUS Normal compressibility of the common femoral, superficial femoral, and popliteal veins, as well as the visualized calf veins. Visualized portions of profunda femoral vein and great saphenous vein unremarkable. No filling defects to suggest DVT on grayscale or color Doppler imaging. Doppler waveforms show normal direction of venous flow, normal respiratory plasticity and response to augmentation. Limited views of the contralateral common femoral vein are unremarkable. OTHER Large left superficial inguinal lymph node measuring 7.9 x 2.6 x 4.7 cm. The cortex is diffusely thickened. Additionally, there is a large highly complex spongiform cystic mass in the superficial soft tissues of the popliteal fossa just deep to the skin surface. The mass measures approximately 8.7 x 2.5 x 8.3  cm. No definite internal vascularity. Limitations: none IMPRESSION: 1. No evidence of deep venous thrombosis in the left lower extremity. 2. Prominent left inguinal lymphadenopathy may be reactive or reflective of an underlying lymphoproliferative process. Recommend clinical correlation for evidence  of additional sites of lymphadenopathy. 3. Large complex spongiform mass in the superficial aspect of the popliteal fossa. The imaging appearance is nonspecific. Differential considerations include hematoma, highly complex Baker's cyst, and potentially a soft tissue neoplasm such as a soft tissue sarcoma. The imaging appearance would be atypical for a Baker's cyst. If the patient has a recent history of injury or muscle strain and there is visible bruising, then hematoma would be favored. If the lesion is palpable, and firm, then concern for soft tissue sarcoma would be elevated. If further imaging is clinically warranted, MRI of the knee with and without gadolinium contrast would be the test of choice. Electronically Signed   By: Malachy Moan M.D.   On: 07/24/2021 12:36    Procedures Procedures   Medications Ordered in ED Medications  sodium chloride 0.9 % bolus 500 mL (0 mLs Intravenous Stopped 07/24/21 1413)  iohexol (OMNIPAQUE) 300 MG/ML solution 75 mL (75 mLs Intravenous Contrast Given 07/24/21 1244)    ED Course  I have reviewed the triage vital signs and the nursing notes.  Pertinent labs & imaging results that were available during my care of the patient were reviewed by me and considered in my medical decision making (see chart for details).  Patient with significant lymphedema and hematoma in left leg.  We are consulting orthopedics. MDM Rules/Calculators/A&P                           Patient is being admitted for hematoma and lymphedema to the left leg by the hospitalist Final Clinical Impression(s) / ED Diagnoses Final diagnoses:  Leg swelling    Rx / DC Orders ED Discharge  Orders     None        Bethann Berkshire, MD 07/28/21 1044

## 2021-07-25 DIAGNOSIS — M7989 Other specified soft tissue disorders: Secondary | ICD-10-CM | POA: Diagnosis not present

## 2021-07-25 LAB — GLUCOSE, CAPILLARY
Glucose-Capillary: 204 mg/dL — ABNORMAL HIGH (ref 70–99)
Glucose-Capillary: 214 mg/dL — ABNORMAL HIGH (ref 70–99)

## 2021-07-25 LAB — BASIC METABOLIC PANEL
Anion gap: 7 (ref 5–15)
BUN: 29 mg/dL — ABNORMAL HIGH (ref 8–23)
CO2: 23 mmol/L (ref 22–32)
Calcium: 7.8 mg/dL — ABNORMAL LOW (ref 8.9–10.3)
Chloride: 102 mmol/L (ref 98–111)
Creatinine, Ser: 1.04 mg/dL — ABNORMAL HIGH (ref 0.44–1.00)
GFR, Estimated: 52 mL/min — ABNORMAL LOW (ref >60)
Glucose, Bld: 214 mg/dL — ABNORMAL HIGH (ref 70–99)
Potassium: 3.9 mmol/L (ref 3.5–5.1)
Sodium: 132 mmol/L — ABNORMAL LOW (ref 135–145)

## 2021-07-25 LAB — CBC
HCT: 34.7 % — ABNORMAL LOW (ref 36.0–46.0)
Hemoglobin: 10.9 g/dL — ABNORMAL LOW (ref 12.0–15.0)
MCH: 30.3 pg (ref 26.0–34.0)
MCHC: 31.4 g/dL (ref 30.0–36.0)
MCV: 96.4 fL (ref 80.0–100.0)
Platelets: 158 10*3/uL (ref 150–400)
RBC: 3.6 MIL/uL — ABNORMAL LOW (ref 3.87–5.11)
RDW: 14.5 % (ref 11.5–15.5)
WBC: 9.1 10*3/uL (ref 4.0–10.5)
nRBC: 0.2 % (ref 0.0–0.2)

## 2021-07-25 LAB — SARS CORONAVIRUS 2 (TAT 6-24 HRS): SARS Coronavirus 2: NEGATIVE

## 2021-07-25 LAB — HEMOGLOBIN AND HEMATOCRIT, BLOOD
HCT: 35.8 % — ABNORMAL LOW (ref 36.0–46.0)
Hemoglobin: 11.3 g/dL — ABNORMAL LOW (ref 12.0–15.0)

## 2021-07-25 MED ORDER — METFORMIN HCL ER 500 MG PO TB24: 500.0000 mg | ORAL_TABLET | Freq: Every day | ORAL | 5 refills | Status: DC

## 2021-07-25 MED ORDER — RIVAROXABAN 15 MG PO TABS: 15.0000 mg | ORAL_TABLET | Freq: Every day | ORAL | 3 refills | Status: DC

## 2021-07-25 MED ORDER — PREDNISONE 10 MG PO TABS: 10.0000 mg | ORAL_TABLET | Freq: Every day | ORAL | 3 refills | Status: DC

## 2021-07-25 NOTE — Progress Notes (Signed)
Nsg Discharge Note  Admit Date:  07/24/2021 Discharge date: 07/25/2021   Linda Hess to be D/C'd Home per MD order.  AVS completed.  Copy for chart, and copy for patient signed, and dated. Patient/caregiver able to verbalize understanding.  Discharge Medication: Allergies as of 07/25/2021       Reactions   Amoxicillin-pot Clavulanate Hives   Other Hives, Itching   RED MEAT   Penicillins    hives        Medication List     TAKE these medications    acetaminophen 325 MG tablet Commonly known as: TYLENOL Take 325 mg by mouth every 6 (six) hours as needed for mild pain.   calcium-vitamin D 500-200 MG-UNIT tablet Commonly known as: OSCAL WITH D Take 1 tablet by mouth at bedtime.   Fish Oil 1200 MG Caps Take 1 capsule by mouth every morning.   gabapentin 100 MG capsule Commonly known as: NEURONTIN Take 100 mg by mouth 3 (three) times daily.   HYDROcodone-acetaminophen 5-325 MG tablet Commonly known as: Norco Take 1 tablet by mouth every 4 (four) hours as needed.   Januvia 100 MG tablet Generic drug: sitaGLIPtin Take 100 mg by mouth daily.   metFORMIN 500 MG 24 hr tablet Commonly known as: GLUCOPHAGE-XR Take 1 tablet (500 mg total) by mouth daily with breakfast. What changed: See the new instructions.   predniSONE 10 MG tablet Commonly known as: DELTASONE Take 1 tablet (10 mg total) by mouth daily with breakfast. What changed:  when to take this Another medication with the same name was removed. Continue taking this medication, and follow the directions you see here.   Rivaroxaban 15 MG Tabs tablet Commonly known as: XARELTO Take 1 tablet (15 mg total) by mouth daily with supper. Start taking on: July 29, 2021 What changed:  medication strength how much to take when to take this These instructions start on July 29, 2021. If you are unsure what to do until then, ask your doctor or other care provider.   rosuvastatin 10 MG tablet Commonly known as:  CRESTOR Take 10 mg by mouth daily.   traMADol 50 MG tablet Commonly known as: ULTRAM Take 50 mg by mouth every 6 (six) hours as needed for moderate pain.   vitamin C 500 MG tablet Commonly known as: ASCORBIC ACID Take 500 mg by mouth daily.        Discharge Assessment: Vitals:   07/25/21 0333 07/25/21 1235  BP: (!) 142/56 129/72  Pulse: 82 84  Resp: 18 18  Temp: 98.4 F (36.9 C) 98.4 F (36.9 C)  SpO2: 100% 100%   Skin clean, dry and intact without evidence of skin break down, no evidence of skin tears noted. IV catheter discontinued intact. Site without signs and symptoms of complications - no redness or edema noted at insertion site, patient denies c/o pain - only slight tenderness at site.  Dressing with slight pressure applied.  D/c Instructions-Education: Discharge instructions given to patient/family with verbalized understanding. D/c education completed with patient/family including follow up instructions, medication list, d/c activities limitations if indicated, with other d/c instructions as indicated by MD - patient able to verbalize understanding, all questions fully answered. Patient instructed to return to ED, call 911, or call MD for any changes in condition.  Patient escorted via WC, and D/C home via private auto.  Demetrio Lapping, LPN 9/92/4268 3:41 PM

## 2021-07-25 NOTE — Progress Notes (Signed)
Pt was able to ambulate well with a walker. Pt was able to get out of bed on her own and ambulate to and from the door several times with a steady gait.

## 2021-07-25 NOTE — Consult Note (Addendum)
  Consult req by Dr Marisa Severin Mariea Clonts)  Chief Complaint  Patient presents with   Leg Swelling    HPI  87 year old female with history of blood clots presented with swelling of her left thigh.  She indicates that she bruises easily and is on anticoagulation for previous history of blood clots  She complains of pain on the medial side of her left thigh with bruising swelling and difficulty ambulating.  Symptoms present for several days worsened over the last day or so prior to admission  Body mass index is 24.16 kg/m.  BP (!) 142/56 (BP Location: Left Arm)   Pulse 82   Temp 98.4 F (36.9 C) (Oral)   Resp 18   Ht 5\' 6"  (1.676 m)   Wt 67.9 kg   SpO2 100%   BMI 24.16 kg/m   Past Medical History:  Diagnosis Date   CIDP (chronic inflammatory demyelinating polyneuropathy) (HCC)    Essential hypertension, benign    pt denies   History of DVT (deep vein thrombosis)    Reportedly left leg, details not clear   Mixed hyperlipidemia    Type 2 diabetes mellitus (HCC)     Physical Exam  Constitutional: Development normal, nutrition normal, body habitus small ectomorphic Mental status oriented x3 mood and affect no depression Cardiovascular pulses and temperature normal   Gait unable to ambulate at present but probably could get up if necessary  Musculoskeletal:  Inspection: Left leg from thigh to foot and then all of her extremities really have subcutaneous ecchymosis.  Old blood is noted on the remaining extremities and then new bruising is not noted on the left thigh and leg Range of motion: No effect on range of motion Stability: Hip is stable Muscle strength and tone: Normal  Skin warm dry and intact no erythema  Neurological sensation normal  Assessment and plan:  Hematoma left thigh   Stop all anticoagulants and observe H/H  No surgery needed   I would strongly consider changing her anticoagulant dose or using a different medication but she seems way too  sensitive to this dose and/or this medication

## 2021-07-25 NOTE — Discharge Summary (Signed)
Linda Hess, is a 87 y.o. female  DOB 26-Apr-1931  MRN [id number].  Admission date:  07/24/2021  Admitting Physician  Onnie Boer, MD  Discharge Date:  07/25/2021   Primary MD  Smith Robert, MD  Recommendations for primary care physician for things to follow:   1)Avoid ibuprofen/Advil/Aleve/Motrin/Goody Powders/Naproxen/BC powders/Meloxicam/Diclofenac/Indomethacin and other Nonsteroidal anti-inflammatory medications as these will make you more likely to bleed and can cause stomach ulcers, can also cause Kidney problems.   2)Please hold Xarelto through Monday, 07/28/2021--- you will need a repeat CBC blood test on Monday, 07/28/2021 -If your hemoglobin is staying above 11 you may restart Xarelto at 15 mg daily instead of 20 mg daily  3) physical therapist will come to your house to help you get stronger due to your back pain and leg pains  4) please see your primary care physician on Monday, 07/28/2021 as advised for repeat CBC blood test to determine if he can restart Xarelto at a lower dose of 15 mg daily after that   Admission Diagnosis  Leg swelling [M79.89]   Discharge Diagnosis  Leg swelling [M79.89]    Principal Problem:   Leg swelling Active Problems:   Essential hypertension, benign   Uncontrolled type 2 diabetes mellitus with stage 3 chronic kidney disease (HCC)   Peripheral arterial disease (HCC)   CIDP (chronic inflammatory demyelinating polyneuropathy) (HCC)   DVT (deep venous thrombosis) (HCC)      Past Medical History:  Diagnosis Date   CIDP (chronic inflammatory demyelinating polyneuropathy) (HCC)    Essential hypertension, benign    pt denies   History of DVT (deep vein thrombosis)    Reportedly left leg, details not clear   Mixed hyperlipidemia    Type 2 diabetes mellitus (HCC)     Past Surgical History:  Procedure Laterality Date   CHOLECYSTECTOMY     Left  quadriceps muscle biopsy  March 2010   Right knee replacement  September 2014       HPI  from the history and physical done on the day of admission:     Chief Complaint: Leg swelling and discoloration   HPI: Linda Hess is a 87 y.o. female with medical history significant for HTN, DM, CIDP.  Patient presented to the ED with complaints of swelling and redness involving her left lower extremity, particularly in the back of her knees that was noticed about 5 days ago.   Patient's son is present at bedside and assists with the history.  Patient uses a wheelchair, patient's outpatient provider, and is thought that the pressure from the wheelchair may have caused trauma to her left posterior knee.  She otherwise denies known trauma to the area, no falls.  No fevers no chills.  No dizziness no chest pain. Patient was in the ED 7/18 for back pain and some bruising to left posterior thigh. Was discharged with prednisone taper.  But son says discoloration started after that ED visit. She was referred to a pain specialist and has had epidural  injection for her back pain and this has significantly improved her pain.   ED Course: Blood pressure systolic 130s to 161W.  Heart rate mostly 50s to 60s.  Hemoglobin 11.8.  Lower extremity ultrasound negative for DVT, suggest hematoma.  Subsequent CT of left lower extremity-heterogeneous collection in distal iliopsoas muscle and abductor longus muscles, hematoma is favored, infection less likely. (Pls see CT scan for full details). Hospitalist to admit.   Review of Systems: As per HPI all other systems reviewed and negative.    Hospital Course:   Brief Summary:- 87 year old female with history of blood clots presented with swelling of her left thigh  in the setting of Xarelto use for H/o recurrent DVTs    A/p  Hematomas multiple areas of hematoma and an ecchymosis however patient has significant new hematoma involving the left lower extremities, onset  about 5 days PTA -  Last dose of Xarelto was am of admission. Suspect hematoma is related to- likely pressure of patient's wheelchair.   -Xarelto was held -Hold Xarelto through Monday, 07/28/2021--- you will need a repeat CBC blood test on Monday, 07/28/2021 -If your hemoglobin is staying above 11 you may restart Xarelto at 15 mg daily instead of 20 mg daily   DVT x 2 - 2017, had DVT around 2013 went on Coumadin for 6 months, then went off Coumadin and then in 2017 had unprovoked DVT, pt remains with persistent risk factors, patient is Not physically active- so she does need Lifelong anti-coagulation -Hold Xarelto for now -Recommend hold Xarelto through Monday, 07/28/2021--- epeat CBC blood test on Monday, 07/28/2021 -If hemoglobin is staying above 11,  may restart Xarelto at 15 mg daily instead of 20 mg daily   CIDP on chronic prednisone  daily -Resume   Uncontrolled diabetes mellitus with hyperglycemia -  A1c 8.5.  On chronic steroids. -Resume Januvia and Metformin   DVT prophylaxis: SCds Code Status: Full code, confirmed with patient and son Rickey at bedside. Family Communication: Son Clide Cliff at bedside.  Discharge Condition: stable  Follow UP   Follow-up Information     Smith Robert, MD. Schedule an appointment as soon as possible for a visit in 3 day(s).   Specialty: Family Medicine Why: For Repeat CBC Contact information: [address] [place] [address] [phone]                 Consults obtained -  Diet and Activity recommendation:  As advised  Discharge Instructions   Discharge Instructions     Call MD for:  difficulty breathing, headache or visual disturbances   Complete by: As directed    Call MD for:  persistant dizziness or light-headedness   Complete by: As directed    Call MD for:  persistant nausea and vomiting   Complete by: As directed    Call MD for:  severe uncontrolled pain   Complete by: As directed    Call MD for:  temperature  >100.4   Complete by: As directed    Diet - low sodium heart healthy   Complete by: As directed    Diet Carb Modified   Complete by: As directed    Discharge instructions   Complete by: As directed    1)Avoid ibuprofen/Advil/Aleve/Motrin/Goody Powders/Naproxen/BC powders/Meloxicam/Diclofenac/Indomethacin and other Nonsteroidal anti-inflammatory medications as these will make you more likely to bleed and can cause stomach ulcers, can also cause Kidney problems.   2)Please hold Xarelto through Monday, 07/28/2021--- you will need a repeat CBC blood test  on Monday, 07/28/2021 -If your hemoglobin is staying above 11 you may restart Xarelto at 15 mg daily instead of 20 mg daily  3) physical therapist will come to your house to help you get stronger due to your back pain and leg pains  4) please see your primary care physician on Monday, 07/28/2021 as advised for repeat CBC blood test to determine if he can restart Xarelto at a lower dose of 15 mg daily after that   Increase activity slowly   Complete by: As directed          Discharge Medications     Allergies as of 07/25/2021       Reactions   Amoxicillin-pot Clavulanate Hives   Other Hives, Itching   RED MEAT   Penicillins    hives        Medication List     TAKE these medications    acetaminophen 325 MG tablet Commonly known as: TYLENOL Take 325 mg by mouth every 6 (six) hours as needed for mild pain.   calcium-vitamin D 500-200 MG-UNIT tablet Commonly known as: OSCAL WITH D Take 1 tablet by mouth at bedtime.   Fish Oil 1200 MG Caps Take 1 capsule by mouth every morning.   gabapentin 100 MG capsule Commonly known as: NEURONTIN Take 100 mg by mouth 3 (three) times daily.   HYDROcodone-acetaminophen 5-325 MG tablet Commonly known as: Norco Take 1 tablet by mouth every 4 (four) hours as needed.   Januvia 100 MG tablet Generic drug: sitaGLIPtin Take 100 mg by mouth daily.   metFORMIN 500 MG 24 hr tablet Commonly  known as: GLUCOPHAGE-XR Take 1 tablet (500 mg total) by mouth daily with breakfast. What changed: See the new instructions.   predniSONE 10 MG tablet Commonly known as: DELTASONE Take 1 tablet (10 mg total) by mouth daily with breakfast. What changed:  when to take this Another medication with the same name was removed. Continue taking this medication, and follow the directions you see here.   Rivaroxaban 15 MG Tabs tablet Commonly known as: XARELTO Take 1 tablet (15 mg total) by mouth daily with supper. Start taking on: July 29, 2021 What changed:  medication strength how much to take when to take this These instructions start on July 29, 2021. If you are unsure what to do until then, ask your doctor or other care provider.   rosuvastatin 10 MG tablet Commonly known as: CRESTOR Take 10 mg by mouth daily.   traMADol 50 MG tablet Commonly known as: ULTRAM Take 50 mg by mouth every 6 (six) hours as needed for moderate pain.   vitamin C 500 MG tablet Commonly known as: ASCORBIC ACID Take 500 mg by mouth daily.        Major procedures and Radiology Reports - PLEASE review detailed and final reports for all details, in brief -   CT TIBIA FIBULA LEFT W CONTRAST  Result Date: 07/24/2021 CLINICAL DATA:  Soft tissue infection suspected, hip, xray done EXAM: CT OF THE LOWER RIGHT EXTREMITY WITH CONTRAST TECHNIQUE: Multidetector CT imaging of the lower right extremity including the left femur, left tibia and fibula, and left foot, was performed according to the standard protocol following intravenous contrast administration. CONTRAST:  94mL OMNIPAQUE IOHEXOL 300 MG/ML  SOLN COMPARISON:  Same day ultrasound, left hip MRI 04/26/2019 FINDINGS: Bones/Joint/Cartilage There are chronic fractures of the left superior and inferior pubic rami. There is no evidence of left femur fracture. 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  There is no evidence of acute fracture. There is tricompartment  knee arthritis, with moderate-severe medial compartment joint space narrowing and trace joint effusion. There is moderate tibiotalar osteoarthritis and degenerative changes throughout the foot. Diffuse osteopenia. There is no osseous destruction or periostitis to suggest osteomyelitis. Partially visualized right knee arthroplasty hardware. Muscles and Tendons There is a heterogeneous collection along the iliopsoas muscle of the left hip, with heterotopic ossification, measuring up to 5.0 x 3.3 cm in the axial dimension. There is also a heterogeneous collection extending within the abductor longus muscle measuring up to 4.2 x 3.4 cm in the axial dimension. There is moderate-severe atrophy of the lower leg musculature, and a intramuscular lipoma involving the medial head gastrocnemius measuring 4.1 x 1.7 cm. Soft tissues There is a coarse calcification along the lateral soft tissues at the level of the distal femur, which is favored to represent sequela of prior chronic hematoma. Massive localized lymphedema of the upper inner thigh. There is extensive soft tissue swelling of the left lower extremity with skin thickening, relatively sparing the anterior thigh. There is irregular skin thickening with subcutaneous water density collection which extends along the skin surface of the posterior distal thigh and posteriorly along the knee. There is diffuse skin thickening and soft tissue swelling of the lower leg as well extending through the ankle and foot. There is no soft tissue gas. Partially visualized low right lower extremity edema. Partially visualized pelvic structures are unremarkable. IMPRESSION: Heterogeneous collections along the distal iliopsoas muscle and abductor longus muscles measuring up to 5.0 and 4.2 cm short axis respectively, extending along the length of the muscles. These are favored to represent hematomas, however infection cannot be completely excluded. Extensive skin thickening and subcutaneous  soft tissue swelling involving the thigh, lower leg, ankle and foot as can be seen in lymphedema or cellulitis. No evidence of osteomyelitis. No deep intramuscular compartment involvement of the lower leg. Massive localized lymphedema along the upper inner thigh. The extensive complex collection along the posterior aspect of the knee superficially recently evaluated on ultrasound is also favored to represent massive localized lymphedema. Electronically Signed   By: Caprice Renshaw   On: 07/24/2021 14:28   CT FEMUR LEFT W CONTRAST  Result Date: 07/24/2021 CLINICAL DATA:  Soft tissue infection suspected, hip, xray done EXAM: CT OF THE LOWER RIGHT EXTREMITY WITH CONTRAST TECHNIQUE: Multidetector CT imaging of the lower right extremity including the left femur, left tibia and fibula, and left foot, was performed according to the standard protocol following intravenous contrast administration. CONTRAST:  75mL OMNIPAQUE IOHEXOL 300 MG/ML  SOLN COMPARISON:  Same day ultrasound, left hip MRI 04/26/2019 FINDINGS: Bones/Joint/Cartilage There are chronic fractures of the left superior and inferior pubic rami. There is no evidence of left femur fracture. 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 There is no evidence of acute fracture. 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 There is moderate tibiotalar osteoarthritis and degenerative changes throughout the foot. Diffuse osteopenia. There is no osseous destruction or periostitis to suggest osteomyelitis. Partially visualized right knee arthroplasty hardware. Muscles and Tendons There is a heterogeneous collection along the iliopsoas muscle of the left hip, with heterotopic ossification, measuring up to 5.0 x 3.3 cm in the axial dimension. There is also a heterogeneous collection extending within the abductor longus muscle measuring up to 4.2 x 3.4 cm in the axial dimension. There is  moderate-severe atrophy of the lower leg musculature, and a intramuscular  lipoma involving the medial head gastrocnemius measuring 4.1 x 1.7 cm. Soft tissues There is a coarse calcification along the lateral soft tissues at the level of the distal femur, which is favored to represent sequela of prior chronic hematoma. Massive localized lymphedema of the upper inner thigh. There is extensive soft tissue swelling of the left lower extremity with skin thickening, relatively sparing the anterior thigh. There is irregular skin thickening with subcutaneous water density collection which extends along the skin surface of the posterior distal thigh and posteriorly along the knee. There is diffuse skin thickening and soft tissue swelling of the lower leg as well extending through the ankle and foot. There is no soft tissue gas. Partially visualized low right lower extremity edema. Partially visualized pelvic structures are unremarkable. IMPRESSION: Heterogeneous collections along the distal iliopsoas muscle and abductor longus muscles measuring up to 5.0 and 4.2 cm short axis respectively, extending along the length of the muscles. These are favored to represent hematomas, however infection cannot be completely excluded. Extensive skin thickening and subcutaneous soft tissue swelling involving the thigh, lower leg, ankle and foot as can be seen in lymphedema or cellulitis. No evidence of osteomyelitis. No deep intramuscular compartment involvement of the lower leg. Massive localized lymphedema along the upper inner thigh. The extensive complex collection along the posterior aspect of the knee superficially recently evaluated on ultrasound is also favored to represent massive localized lymphedema. Electronically Signed   By: Caprice Renshaw   On: 07/24/2021 14:28   CT FOOT LEFT W CONTRAST  Result Date: 07/24/2021 CLINICAL DATA:  Soft tissue infection suspected, hip, xray done EXAM: CT OF THE LOWER RIGHT EXTREMITY WITH  CONTRAST TECHNIQUE: Multidetector CT imaging of the lower right extremity including the left femur, left tibia and fibula, and left foot, was performed according to the standard protocol following intravenous contrast administration. CONTRAST:  75mL OMNIPAQUE IOHEXOL 300 MG/ML  SOLN COMPARISON:  Same day ultrasound, left hip MRI 04/26/2019 FINDINGS: Bones/Joint/Cartilage There are chronic fractures of the left superior and inferior pubic rami. There is no evidence of left femur fracture. 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 There is no evidence of acute fracture. 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 There is moderate tibiotalar osteoarthritis and degenerative changes throughout the foot. Diffuse osteopenia. There is no osseous destruction or periostitis to suggest osteomyelitis. Partially visualized right knee arthroplasty hardware. Muscles and Tendons There is a heterogeneous collection along the iliopsoas muscle of the left hip, with heterotopic ossification, measuring up to 5.0 x 3.3 cm in the axial dimension. There is also a heterogeneous collection extending within the abductor longus muscle measuring up to 4.2 x 3.4 cm in the axial dimension. There is moderate-severe atrophy of the lower leg musculature, and a intramuscular lipoma involving the medial head gastrocnemius measuring 4.1 x 1.7 cm. Soft tissues There is a coarse calcification along the lateral soft tissues at the level of the distal femur, which is favored to represent sequela of prior chronic hematoma. Massive localized lymphedema of the upper inner thigh. There is extensive soft tissue swelling of the left lower extremity with skin thickening, relatively sparing the anterior thigh. There is irregular skin thickening with subcutaneous water density collection which extends along the skin surface of the posterior distal thigh and posteriorly along the  knee. There is diffuse skin thickening and soft tissue swelling of the lower leg as well extending through the ankle and foot. There is no soft  tissue gas. Partially visualized low right lower extremity edema. Partially visualized pelvic structures are unremarkable. IMPRESSION: Heterogeneous collections along the distal iliopsoas muscle and abductor longus muscles measuring up to 5.0 and 4.2 cm short axis respectively, extending along the length of the muscles. These are favored to represent hematomas, however infection cannot be completely excluded. Extensive skin thickening and subcutaneous soft tissue swelling involving the thigh, lower leg, ankle and foot as can be seen in lymphedema or cellulitis. No evidence of osteomyelitis. No deep intramuscular compartment involvement of the lower leg. Massive localized lymphedema along the upper inner thigh. The extensive complex collection along the posterior aspect of the knee superficially recently evaluated on ultrasound is also favored to represent massive localized lymphedema. Electronically Signed   By: Caprice Renshaw   On: 07/24/2021 14:28   US Venous Img Lower Unilateral Left  Result Date: 07/24/2021 CLINICAL DATA:  87 year old female with left lower extremity pain and edema for 1 week EXAM: LEFT LOWER EXTREMITY VENOUS DOPPLER ULTRASOUND TECHNIQUE: Gray-scale sonography with compression, as well as color and duplex ultrasound, were performed to evaluate the deep venous system(s) from the level of the common femoral vein through the popliteal and proximal calf veins. COMPARISON:  None. FINDINGS: VENOUS Normal compressibility of the common femoral, superficial femoral, and popliteal veins, as well as the visualized calf veins. Visualized portions of profunda femoral vein and great saphenous vein unremarkable. No filling defects to suggest DVT on grayscale or color Doppler imaging. Doppler waveforms show normal direction of venous flow, normal respiratory plasticity  and response to augmentation. Limited views of the contralateral common femoral vein are unremarkable. OTHER Large left superficial inguinal lymph node measuring 7.9 x 2.6 x 4.7 cm. The cortex is diffusely thickened. Additionally, there is a large highly complex spongiform cystic mass in the superficial soft tissues of the popliteal fossa just deep to the skin surface. The mass measures approximately 8.7 x 2.5 x 8.3 cm. No definite internal vascularity. Limitations: none IMPRESSION: 1. No evidence of deep venous thrombosis in the left lower extremity. 2. Prominent left inguinal lymphadenopathy may be reactive or reflective of an underlying lymphoproliferative process. Recommend clinical correlation for evidence of additional sites of lymphadenopathy. 3. Large complex spongiform mass in the superficial aspect of the popliteal fossa. The imaging appearance is nonspecific. Differential considerations include hematoma, highly complex Baker's cyst, and potentially a soft tissue neoplasm such as a soft tissue sarcoma. The imaging appearance would be atypical for a Baker's cyst. If the patient has a recent history of injury or muscle strain and there is visible bruising, then hematoma would be favored. If the lesion is palpable, and firm, then concern for soft tissue sarcoma would be elevated. If further imaging is clinically warranted, MRI of the knee with and without gadolinium contrast would be the test of choice. Electronically Signed   By: Malachy Moan M.D.   On: 07/24/2021 12:36   US Venous Img Lower Unilateral Left  Result Date: 07/14/2021 CLINICAL DATA:  Lower extremity pain for 4 days. History of left lower extremity DVT. EXAM: LEFT LOWER EXTREMITY VENOUS DOPPLER ULTRASOUND TECHNIQUE: Gray-scale sonography with compression, as well as color and duplex ultrasound, were performed to evaluate the deep venous system(s) from the level of the common femoral vein through the popliteal and proximal calf veins.  COMPARISON:  06/19/2015 FINDINGS: VENOUS Normal compressibility of the common femoral, superficial femoral, and popliteal veins, as well as the visualized calf veins. Visualized portions of profunda femoral vein and great saphenous  vein unremarkable. No filling defects to suggest DVT on grayscale or color Doppler imaging. Mild wall thickening of the left common femoral vein that may represent sequela of prior DVT, however no thrombus is visualized. Doppler waveforms show normal direction of venous flow, normal respiratory plasticity and response to augmentation. Limited views of the contralateral common femoral vein are unremarkable. OTHER None. Limitations: none IMPRESSION: No evidence of left lower extremity DVT. Electronically Signed   By: Narda RutherfordMelanie  Sanford M.D.   On: 07/14/2021 17:29   DG HIP UNILAT WITH PELVIS 2-3 VIEWS LEFT  Result Date: 07/14/2021 CLINICAL DATA:  Hip pain since Saturday, no known injury EXAM: DG HIP (WITH OR WITHOUT PELVIS) 2-3V LEFT COMPARISON:  04/02/2019 FINDINGS: Osseous demineralization. Mild narrowing of the hip joints bilaterally. SI joints preserved. Old fractures of the LEFT superior and inferior pubic rami. No acute fracture, dislocation, or bone destruction. IMPRESSION: Osseous demineralization with old LEFT pubic fractures. No acute osseous abnormalities. Electronically Signed   By: Ulyses SouthwardMark  Boles M.D.   On: 07/14/2021 12:35    Micro Results   Recent Results (from the past 240 hour(s))  SARS CORONAVIRUS 2 (TAT 6-24 HRS) Nasopharyngeal Nasopharyngeal Swab     Status: None   Collection Time: 07/24/21  8:59 PM   Specimen: Nasopharyngeal Swab  Result Value Ref Range Status   SARS Coronavirus 2 NEGATIVE NEGATIVE Final    Comment: (NOTE) SARS-CoV-2 target nucleic acids are NOT DETECTED.  The SARS-CoV-2 RNA is generally detectable in upper and lower respiratory specimens during the acute phase of infection. Negative results do not preclude SARS-CoV-2 infection, do not rule  out co-infections with other pathogens, and should not be used as the sole basis for treatment or other patient management decisions. Negative results must be combined with clinical observations, patient history, and epidemiological information. The expected result is Negative.  Fact Sheet for Patients: HairSlick.nohttps://www.fda.gov/media/138098/download  Fact Sheet for Healthcare Providers: quierodirigir.comhttps://www.fda.gov/media/138095/download  This test is not yet approved or cleared by the [place] FDA and  has been authorized for detection and/or diagnosis of SARS-CoV-2 by FDA under an Emergency Use Authorization (EUA). This EUA will remain  in effect (meaning this test can be used) for the duration of the COVID-19 declaration under Se ction 564(b)(1) of the Act, 21 U.S.C. section 360bbb-3(b)(1), unless the authorization is terminated or revoked sooner.  Performed at [facility], [address] [place], [place] [address]        Today   Subjective   Gaynelle ArabianMargaret Heffington today has no new c/o  No fever  Or chills   No Nausea, Vomiting or Diarrhea          Patient has been seen and examined prior to discharge   Objective   Blood pressure 129/72, pulse 84, temperature 98.4 F (36.9 C), temperature source Oral, resp. rate 18, height 5\' 6"  (1.676 m), weight 67.9 kg, SpO2 100 %.   Intake/Output Summary (Last 24 hours) at 07/25/2021 1506 Last data filed at 07/25/2021 1236 Gross per 24 hour  Intake 650 ml  Output 600 ml  Net 50 ml    Exam Gen:- Awake Alert, no acute distress  HEENT:- [place].AT, No sclera icterus Neck-Supple Neck,No JVD,.  Lungs-  CTAB , good air movement bilaterally  CV- S1, S2 normal, regular Abd-  +ve B.Sounds, Abd Soft, No tenderness,    Extremity/Skin:-  good pulses,, extensive bruising and hematoma at different stages of resolution (acute to subacute to chronic )on all extremities, hematoma on left lower  extremity appears to be more new Psych-affect is  appropriate, oriented x3 Neuro-no new focal deficits, no tremors    Data Review   CBC w Diff:  Lab Results  Component Value Date   WBC 9.1 07/25/2021   HGB 11.3 (L) 07/25/2021   HGB 12.8 08/30/2013   HCT 35.8 (L) 07/25/2021   HCT 42.3 08/22/2013   PLT 158 07/25/2021   PLT 124 (L) 08/30/2013   LYMPHOPCT 6 07/24/2021   MONOPCT 5 07/24/2021   EOSPCT 0 07/24/2021   BASOPCT 0 07/24/2021    CMP:  Lab Results  Component Value Date   NA 132 (L) 07/25/2021   NA 139 08/30/2013   K 3.9 07/25/2021   K 4.3 08/31/2013   CL 102 07/25/2021   CL 107 08/30/2013   CO2 23 07/25/2021   CO2 26 08/30/2013   BUN 29 (H) 07/25/2021   BUN 36 (A) 04/07/2021   BUN 27 (H) 08/30/2013   CREATININE 1.04 (H) 07/25/2021   CREATININE 1.34 (H) 04/28/2016   PROT 5.8 (L) 07/24/2021   ALBUMIN 3.3 (L) 07/24/2021   BILITOT 2.5 (H) 07/24/2021   ALKPHOS 61 07/24/2021   AST 17 07/24/2021   ALT 18 07/24/2021  .  Total Discharge time is about 33 minutes  Shon Hale M.D on 07/25/2021 at 3:06 PM  Go to [email or web address] -  for contact info  [facility] - Office  [phone]

## 2021-07-25 NOTE — Discharge Instructions (Signed)
1)Avoid ibuprofen/Advil/Aleve/Motrin/Goody Powders/Naproxen/BC powders/Meloxicam/Diclofenac/Indomethacin and other Nonsteroidal anti-inflammatory medications as these will make you more likely to bleed and can cause stomach ulcers, can also cause Kidney problems.   2)Please hold Xarelto through Monday, 07/28/2021--- you will need a repeat CBC blood test on Monday, 07/28/2021 -If your hemoglobin is staying above 11 you may restart Xarelto at 15 mg daily instead of 20 mg daily  3) physical therapist will come to your house to help you get stronger due to your back pain and leg pains  4) please see your primary care physician on Monday, 07/28/2021 as advised for repeat CBC blood test to determine if he can restart Xarelto at a lower dose of 15 mg daily after that

## 2021-07-25 NOTE — TOC Transition Note (Signed)
Transition of Care [facility]) - CM/SW Discharge Note   Patient Details  Name: LIBORIA PUTNAM MRN: [id number] Date of Birth: 07-31-1931  Transition of Care [facility]) CM/SW Contact:  Barry Brunner, LCSW Phone Number: 07/25/2021, 4:34 PM   Clinical Narrative:    CSW notified of patient's readiness for discharge. CSW referred patient to [place] with Frances Furbish. Denyse Amass agreeable to take patient. TOC to follow.    Final next level of care: Home w Home Health Services Barriers to Discharge: Barriers Resolved   Patient Goals and CMS Choice Patient states their goals for this hospitalization and ongoing recovery are:: Return home with [facility] CMS Medicare.gov Compare Post Acute Care list provided to:: Patient Choice offered to / list presented to : Patient  Discharge Placement                    Patient and family notified of of transfer: 07/25/21  Discharge Plan and Services                          HH Arranged: PT [facility] Agency: [facility] Date [facility] Agency Contacted: 07/25/21 Time HH Agency Contacted: 1633 Representative spoke with at [facility] Agency: Denyse Amass  Social Determinants of Health (SDOH) Interventions     Readmission Risk Interventions No flowsheet data found.

## 2021-07-25 NOTE — Progress Notes (Signed)
   07/25/21 0323 ECG Monitoring CV Strip Heart Rate 154 Cardiac Rhythm NSR Ectopy Paroxysmal supraventricular tachycardia (0220 strip Royetta Crochet notified) Provider Notification Provider Name/Title Dr. Thomes Dinning Date Provider Notified 07/25/21 Time Provider Notified 0330 Notification Type Page Notification Reason Other (Comment)

## 2021-07-25 NOTE — Progress Notes (Addendum)
Patient ID: Linda Hess, female   DOB: 07/29/1931, 87 y.o.   MRN: [id number] Left thigh hematoma   Rec   Stop anticoags Observe h/h  No surgery needed

## 2021-07-25 NOTE — Progress Notes (Signed)
Inpatient Diabetes Program Recommendations  AACE/ADA: New Consensus Statement on Inpatient Glycemic Control (2015)  Target Ranges:  Prepandial:   less than 140 mg/dL      Peak postprandial:   less than 180 mg/dL (1-2 hours)      Critically ill patients:  140 - 180 mg/dL   Lab Results  Component Value Date   GLUCAP 214 (H) 07/25/2021   HGBA1C 8.5 04/07/2021    Review of Glycemic Control Results for MYCHAL, DURIO (MRN [id number]) as of 07/25/2021 12:27  Ref. Range 07/24/2021 15:48 07/24/2021 21:14 07/25/2021 07:29 07/25/2021 10:57  Glucose-Capillary Latest Ref Range: 70 - 99 mg/dL 354 (H) 656 (H) 812 (H) 214 (H)   Diabetes history: DM2 Outpatient Diabetes medications: Januvia 100 mg QD, Metformin 500 mg QAM Current orders for Inpatient glycemic control: Novolog 0-15 units TID and 0-5 QHS  Inpatient Diabetes Program Recommendations:    Please consider adding Lantus 7 units (0.1 units/kg)  Will continue to follow while inpatient.  Thank you, Dulce Sellar, RN, BSN Diabetes Coordinator Inpatient Diabetes Program [phone] (team pager from 8a-5p)

## 2021-07-26 ENCOUNTER — Encounter (HOSPITAL_COMMUNITY): Payer: Self-pay | Admitting: Emergency Medicine

## 2021-07-26 ENCOUNTER — Other Ambulatory Visit: Payer: Self-pay

## 2021-07-26 ENCOUNTER — Emergency Department (HOSPITAL_COMMUNITY)
Admission: EM | Admit: 2021-07-26 | Discharge: 2021-07-26 | Disposition: A | Discharge status: Home / Self Care | Payer: Medicare Other | Attending: Emergency Medicine | Admitting: Emergency Medicine

## 2021-07-26 DIAGNOSIS — R609 Edema, unspecified: Secondary | ICD-10-CM

## 2021-07-26 DIAGNOSIS — Z96651 Presence of right artificial knee joint: Secondary | ICD-10-CM | POA: Diagnosis not present

## 2021-07-26 DIAGNOSIS — E1151 Type 2 diabetes mellitus with diabetic peripheral angiopathy without gangrene: Secondary | ICD-10-CM | POA: Insufficient documentation

## 2021-07-26 DIAGNOSIS — E1169 Type 2 diabetes mellitus with other specified complication: Secondary | ICD-10-CM | POA: Insufficient documentation

## 2021-07-26 DIAGNOSIS — S8012XA Contusion of left lower leg, initial encounter: Secondary | ICD-10-CM | POA: Diagnosis not present

## 2021-07-26 DIAGNOSIS — E782 Mixed hyperlipidemia: Secondary | ICD-10-CM | POA: Diagnosis not present

## 2021-07-26 DIAGNOSIS — X58XXXA Exposure to other specified factors, initial encounter: Secondary | ICD-10-CM | POA: Diagnosis not present

## 2021-07-26 DIAGNOSIS — Z79899 Other long term (current) drug therapy: Secondary | ICD-10-CM | POA: Diagnosis not present

## 2021-07-26 DIAGNOSIS — E1142 Type 2 diabetes mellitus with diabetic polyneuropathy: Secondary | ICD-10-CM | POA: Insufficient documentation

## 2021-07-26 DIAGNOSIS — Z7984 Long term (current) use of oral hypoglycemic drugs: Secondary | ICD-10-CM | POA: Diagnosis not present

## 2021-07-26 DIAGNOSIS — N183 Chronic kidney disease, stage 3 unspecified: Secondary | ICD-10-CM | POA: Insufficient documentation

## 2021-07-26 DIAGNOSIS — Z7901 Long term (current) use of anticoagulants: Secondary | ICD-10-CM | POA: Diagnosis not present

## 2021-07-26 DIAGNOSIS — R600 Localized edema: Secondary | ICD-10-CM | POA: Diagnosis not present

## 2021-07-26 DIAGNOSIS — E1122 Type 2 diabetes mellitus with diabetic chronic kidney disease: Secondary | ICD-10-CM | POA: Insufficient documentation

## 2021-07-26 DIAGNOSIS — I129 Hypertensive chronic kidney disease with stage 1 through stage 4 chronic kidney disease, or unspecified chronic kidney disease: Secondary | ICD-10-CM | POA: Diagnosis not present

## 2021-07-26 DIAGNOSIS — S8992XA Unspecified injury of left lower leg, initial encounter: Secondary | ICD-10-CM | POA: Diagnosis present

## 2021-07-26 LAB — CBC WITH DIFFERENTIAL/PLATELET
Abs Immature Granulocytes: 0.12 10*3/uL — ABNORMAL HIGH (ref 0.00–0.07)
Basophils Absolute: 0 10*3/uL (ref 0.0–0.1)
Basophils Relative: 0 %
Eosinophils Absolute: 0 10*3/uL (ref 0.0–0.5)
Eosinophils Relative: 0 %
HCT: 36.7 % (ref 36.0–46.0)
Hemoglobin: 11.8 g/dL — ABNORMAL LOW (ref 12.0–15.0)
Immature Granulocytes: 1 %
Lymphocytes Relative: 3 %
Lymphs Abs: 0.4 10*3/uL — ABNORMAL LOW (ref 0.7–4.0)
MCH: 30.2 pg (ref 26.0–34.0)
MCHC: 32.2 g/dL (ref 30.0–36.0)
MCV: 93.9 fL (ref 80.0–100.0)
Monocytes Absolute: 0.5 10*3/uL (ref 0.1–1.0)
Monocytes Relative: 4 %
Neutro Abs: 11.9 10*3/uL — ABNORMAL HIGH (ref 1.7–7.7)
Neutrophils Relative %: 92 %
Platelets: 156 10*3/uL (ref 150–400)
RBC: 3.91 MIL/uL (ref 3.87–5.11)
RDW: 14.2 % (ref 11.5–15.5)
WBC: 12.9 10*3/uL — ABNORMAL HIGH (ref 4.0–10.5)
nRBC: 0 % (ref 0.0–0.2)

## 2021-07-26 LAB — BASIC METABOLIC PANEL
Anion gap: 9 (ref 5–15)
BUN: 27 mg/dL — ABNORMAL HIGH (ref 8–23)
CO2: 22 mmol/L (ref 22–32)
Calcium: 8.3 mg/dL — ABNORMAL LOW (ref 8.9–10.3)
Chloride: 99 mmol/L (ref 98–111)
Creatinine, Ser: 1.31 mg/dL — ABNORMAL HIGH (ref 0.44–1.00)
GFR, Estimated: 39 mL/min — ABNORMAL LOW (ref >60)
Glucose, Bld: 474 mg/dL — ABNORMAL HIGH (ref 70–99)
Potassium: 4.8 mmol/L (ref 3.5–5.1)
Sodium: 130 mmol/L — ABNORMAL LOW (ref 135–145)

## 2021-07-26 NOTE — ED Triage Notes (Signed)
Pt reports edema and discoloration to LLE x 1 week with large hematoma to posterior LLE.  Denies injury.  Denies pain.

## 2021-07-26 NOTE — ED Provider Notes (Signed)
Emergency Medicine Provider Triage Evaluation Note  VARVARA LEGAULT , a 87 y.o. female  was evaluated in triage.  Pt complains of large hematoma to the posterior aspect of her left lower extremity.  States that it is gotten bigger since she was discharged from [place] yesterday.  Has stopped taking her Xarelto and was due to be reevaluated on Monday.  Review of Systems  Positive: Leg bruising Negative: Fever, chills  Physical Exam  BP (!) 139/109 (BP Location: Right Arm)   Pulse 89   Temp 98.5 F (36.9 C) (Oral)   Resp 16   SpO2 98%  Gen:   Awake, no distress   Resp:  Normal effort  MSK:   Large hematoma located on the posterior LLE behind the knee   Medical Decision Making  Medically screening exam initiated at 3:21 PM.  Appropriate orders placed.  Jannett Celestine was informed that the remainder of the evaluation will be completed by another provider, this initial triage assessment does not replace that evaluation, and the importance of remaining in the ED until their evaluation is complete.     Woodroe Chen 07/26/21 1536    Terald Sleeper, MD 07/27/21 [phone]

## 2021-07-26 NOTE — ED Provider Notes (Signed)
[facility] EMERGENCY DEPARTMENT Provider Note   CSN: [id number] Arrival date & time: 07/26/21  1454     History No chief complaint on file.   Linda Hess is a 87 y.o. female presenting for evaluation of leg swelling.   Patient states she has had a week of swelling of her left leg.  She was seen at the [facility] emergency department and admitted for observation.  Since she left, she states is gotten bigger.  She denies pain.  She denies fall, trauma, or injury.  She was on Xarelto, this was held when she developed this hematoma, and she will follow-up with her PCP next week for recheck.  She denies chest pain or shortness of breath.  No pain in her back or abdomen.  No difficulty urinating.  Additional history obtained from chart review.  I reviewed CT imaging and ultrasound from her recent admission 2 days ago.  HPI     Past Medical History:  Diagnosis Date   CIDP (chronic inflammatory demyelinating polyneuropathy) (HCC)    Essential hypertension, benign    pt denies   History of DVT (deep vein thrombosis)    Reportedly left leg, details not clear   Mixed hyperlipidemia    Type 2 diabetes mellitus (HCC)     Patient Active Problem List   Diagnosis Date Noted   Leg swelling 07/24/2021   DVT (deep venous thrombosis) (HCC) 07/24/2021   Cellulitis of right upper extremity 12/14/2018   Peripheral arterial disease (HCC) 03/14/2014   CIDP (chronic inflammatory demyelinating polyneuropathy) (HCC) 03/14/2014   Mixed hyperlipidemia 03/12/2014   Essential hypertension, benign 03/12/2014   Uncontrolled type 2 diabetes mellitus with stage 3 chronic kidney disease (HCC) 03/12/2014    Past Surgical History:  Procedure Laterality Date   CHOLECYSTECTOMY     Left quadriceps muscle biopsy  March 2010   Right knee replacement  September 2014     OB History     Gravida  4   Para  4   Term  4   Preterm      AB      Living  4      SAB      IAB       Ectopic      Multiple      Live Births              Family History  Problem Relation Age of Onset   Heart attack Father    Stroke Mother    Cancer Brother     Social History   Tobacco Use   Smoking status: Never   Smokeless tobacco: Never  Vaping Use   Vaping Use: Never used  Substance Use Topics   Alcohol use: No   Drug use: No    Home Medications Prior to Admission medications   Medication Sig Start Date End Date Taking? Authorizing Provider  acetaminophen (TYLENOL) 325 MG tablet Take 325 mg by mouth every 6 (six) hours as needed for mild pain.    [provider]  calcium-vitamin D (OSCAL WITH D) 500-200 MG-UNIT per tablet Take 1 tablet by mouth at bedtime.  03/14/14   Jonelle Sidle, MD  gabapentin (NEURONTIN) 100 MG capsule Take 100 mg by mouth 3 (three) times daily.  08/30/18   [provider]  HYDROcodone-acetaminophen (NORCO) 5-325 MG tablet Take 1 tablet by mouth every 4 (four) hours as needed. 07/14/21   Mancel Bale, MD  JANUVIA 100 MG tablet  Take 100 mg by mouth daily. 03/21/20   [provider]  metFORMIN (GLUCOPHAGE-XR) 500 MG 24 hr tablet Take 1 tablet (500 mg total) by mouth daily with breakfast. 07/25/21   Shon Hale, MD  Omega-3 Fatty Acids (FISH OIL) 1200 MG CAPS Take 1 capsule by mouth every morning.     [provider]  predniSONE (DELTASONE) 10 MG tablet Take 1 tablet (10 mg total) by mouth daily with breakfast. 07/25/21   Shon Hale, MD  Rivaroxaban (XARELTO) 15 MG TABS tablet Take 1 tablet (15 mg total) by mouth daily with supper. 07/29/21   Shon Hale, MD  rosuvastatin (CRESTOR) 10 MG tablet Take 10 mg by mouth daily. 03/30/19   [provider]  traMADol (ULTRAM) 50 MG tablet Take 50 mg by mouth every 6 (six) hours as needed for moderate pain.     [provider]  vitamin C (ASCORBIC ACID) 500 MG tablet Take 500 mg by mouth daily.    [provider]    Allergies     Amoxicillin-pot clavulanate, Other, and Penicillins  Review of Systems   Review of Systems  Cardiovascular:  Positive for leg swelling.  Hematological:  Bruises/bleeds easily.  All other systems reviewed and are negative.  Physical Exam Updated Vital Signs BP 123/83   Pulse 71   Temp 98.5 F (36.9 C) (Oral)   Resp 16   SpO2 100%   Physical Exam Vitals and nursing note reviewed.  Constitutional:      General: She is not in acute distress.    Appearance: Normal appearance.     Comments: Resting in the bed in NAD  HENT:     Head: Normocephalic and atraumatic.  Eyes:     Conjunctiva/sclera: Conjunctivae normal.     Pupils: Pupils are equal, round, and reactive to light.  Cardiovascular:     Rate and Rhythm: Normal rate and regular rhythm.     Pulses: Normal pulses.  Pulmonary:     Effort: Pulmonary effort is normal. No respiratory distress.     Breath sounds: Normal breath sounds. No wheezing.     Comments: Speaking in full sentences.  Clear lung sounds in all fields. Abdominal:     General: There is no distension.     Palpations: Abdomen is soft.     Tenderness: There is no abdominal tenderness.  Musculoskeletal:        General: Normal range of motion.     Cervical back: Normal range of motion and neck supple.     Right lower leg: Edema present.     Left lower leg: Edema present.     Comments: Bilateral pitting edema, left greater than right.  Chronic skin changes noted. Large, baseball sized hematoma of the proximal L calf.  No active bleeding.  Patient does have bruising of the upper anterior leg and some dependent bruising of the left foot.  Patient also with a bruise of her left low back, but no bruising over the kidney no ttp of midline spine  Skin:    General: Skin is warm and dry.     Capillary Refill: Capillary refill takes less than 2 seconds.  Neurological:     Mental Status: She is alert and oriented to person, place, and time.  Psychiatric:        Mood  and Affect: Mood and affect normal.        Speech: Speech normal.        Behavior: Behavior normal.  ED Results / Procedures / Treatments   Labs (all labs ordered are listed, but only abnormal results are displayed) Labs Reviewed  CBC WITH DIFFERENTIAL/PLATELET - Abnormal; Notable for the following components:      Result Value   WBC 12.9 (*)    Hemoglobin 11.8 (*)    Neutro Abs 11.9 (*)    Lymphs Abs 0.4 (*)    Abs Immature Granulocytes 0.12 (*)    All other components within normal limits  BASIC METABOLIC PANEL - Abnormal; Notable for the following components:   Sodium 130 (*)    Glucose, Bld 474 (*)    BUN 27 (*)    Creatinine, Ser 1.31 (*)    Calcium 8.3 (*)    GFR, Estimated 39 (*)    All other components within normal limits    EKG None  Radiology No results found.  Procedures Procedures   Medications Ordered in ED Medications - No data to display  ED Course  I have reviewed the triage vital signs and the nursing notes.  Pertinent labs & imaging results that were available during my care of the patient were reviewed by me and considered in my medical decision making (see chart for details).    MDM Rules/Calculators/A&P                           Patient presenting for evaluation of hematoma and leg swelling.  On exam, patient appears nontoxic.  She is neurovascular intact.  She does have a large hematoma and edema bilaterally, worse in the left side.  There is also bruising of her left low back.  Patient had extensive work-up recently, which was negative for DVT, infection, or active bleeding.  Patient's hemoglobin is stable today.  Discussed findings with patient and son.  Discussed that we will take time for reabsorption of the hematoma.  Discussed the process of dependent edema and importance of elevation.  Encouraged close follow-up with PCP.  At this time, patient appears safe for discharge.  Return precautions given.  Patient and son state they understand  and agree to plan.  Final Clinical Impression(s) / ED Diagnoses Final diagnoses:  Hematoma of left lower leg  Peripheral edema    Rx / DC Orders ED Discharge Orders     None        Alveria Apley, PA-C 07/26/21 2104    Vanetta Mulders, MD 07/31/21 1250

## 2021-07-26 NOTE — Discharge Instructions (Addendum)
Your blood counts today were normal, which is reassuring.  Follow-up closely with your primary care doctor for recheck. Return to the emergency room if you develop severe worsening pain, dizziness, lightheadedness, chest pain, shortness of breath

## 2021-07-26 NOTE — ED Provider Notes (Signed)
I provided a substantive portion of the care of this patient.  I personally performed the entirety of the history, exam, and medical decision making for this encounter.    Results for orders placed or performed during the hospital encounter of 07/26/21  CBC with Differential  Result Value Ref Range   WBC 12.9 (H) 4.0 - 10.5 K/uL   RBC 3.91 3.87 - 5.11 MIL/uL   Hemoglobin 11.8 (L) 12.0 - 15.0 g/dL   HCT 10.1 75.1 - 02.5 %   MCV 93.9 80.0 - 100.0 fL   MCH 30.2 26.0 - 34.0 pg   MCHC 32.2 30.0 - 36.0 g/dL   RDW 85.2 77.8 - 24.2 %   Platelets 156 150 - 400 K/uL   nRBC 0.0 0.0 - 0.2 %   Neutrophils Relative % 92 %   Neutro Abs 11.9 (H) 1.7 - 7.7 K/uL   Lymphocytes Relative 3 %   Lymphs Abs 0.4 (L) 0.7 - 4.0 K/uL   Monocytes Relative 4 %   Monocytes Absolute 0.5 0.1 - 1.0 K/uL   Eosinophils Relative 0 %   Eosinophils Absolute 0.0 0.0 - 0.5 K/uL   Basophils Relative 0 %   Basophils Absolute 0.0 0.0 - 0.1 K/uL   Immature Granulocytes 1 %   Abs Immature Granulocytes 0.12 (H) 0.00 - 0.07 K/uL  Basic metabolic panel  Result Value Ref Range   Sodium 130 (L) 135 - 145 mmol/L   Potassium 4.8 3.5 - 5.1 mmol/L   Chloride 99 98 - 111 mmol/L   CO2 22 22 - 32 mmol/L   Glucose, Bld 474 (H) 70 - 99 mg/dL   BUN 27 (H) 8 - 23 mg/dL   Creatinine, Ser 3.53 (H) 0.44 - 1.00 mg/dL   Calcium 8.3 (L) 8.9 - 10.3 mg/dL   GFR, Estimated 39 (L) >60 mL/min   Anion gap 9 5 - 15   CT TIBIA FIBULA LEFT W CONTRAST  Result Date: 07/24/2021 CLINICAL DATA:  Soft tissue infection suspected, hip, xray done EXAM: CT OF THE LOWER RIGHT EXTREMITY WITH CONTRAST TECHNIQUE: Multidetector CT imaging of the lower right extremity including the left femur, left tibia and fibula, and left foot, was performed according to the standard protocol following intravenous contrast administration. CONTRAST:  40mL OMNIPAQUE IOHEXOL 300 MG/ML  SOLN COMPARISON:  Same day ultrasound, left hip MRI 04/26/2019 FINDINGS: Bones/Joint/Cartilage  There are chronic fractures of the left superior and inferior pubic rami. There is no evidence of left femur fracture. 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 There is no evidence of acute fracture. 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 There is moderate tibiotalar osteoarthritis and degenerative changes throughout the foot. Diffuse osteopenia. There is no osseous destruction or periostitis to suggest osteomyelitis. Partially visualized right knee arthroplasty hardware. Muscles and Tendons There is a heterogeneous collection along the iliopsoas muscle of the left hip, with heterotopic ossification, measuring up to 5.0 x 3.3 cm in the axial dimension. There is also a heterogeneous collection extending within the abductor longus muscle measuring up to 4.2 x 3.4 cm in the axial dimension. There is moderate-severe atrophy of the lower leg musculature, and a intramuscular lipoma involving the medial head gastrocnemius measuring 4.1 x 1.7 cm. Soft tissues There is a coarse calcification along the lateral soft tissues at the level of the distal femur, which is favored to represent sequela of prior chronic hematoma. Massive localized lymphedema of the upper inner thigh. There  is extensive soft tissue swelling of the left lower extremity with skin thickening, relatively sparing the anterior thigh. There is irregular skin thickening with subcutaneous water density collection which extends along the skin surface of the posterior distal thigh and posteriorly along the knee. There is diffuse skin thickening and soft tissue swelling of the lower leg as well extending through the ankle and foot. There is no soft tissue gas. Partially visualized low right lower extremity edema. Partially visualized pelvic structures are unremarkable. IMPRESSION: Heterogeneous collections along the distal iliopsoas muscle and abductor longus muscles  measuring up to 5.0 and 4.2 cm short axis respectively, extending along the length of the muscles. These are favored to represent hematomas, however infection cannot be completely excluded. Extensive skin thickening and subcutaneous soft tissue swelling involving the thigh, lower leg, ankle and foot as can be seen in lymphedema or cellulitis. No evidence of osteomyelitis. No deep intramuscular compartment involvement of the lower leg. Massive localized lymphedema along the upper inner thigh. The extensive complex collection along the posterior aspect of the knee superficially recently evaluated on ultrasound is also favored to represent massive localized lymphedema. Electronically Signed   By: Caprice RenshawJacob  Kahn   On: 07/24/2021 14:28   CT FEMUR LEFT W CONTRAST  Result Date: 07/24/2021 CLINICAL DATA:  Soft tissue infection suspected, hip, xray done EXAM: CT OF THE LOWER RIGHT EXTREMITY WITH CONTRAST TECHNIQUE: Multidetector CT imaging of the lower right extremity including the left femur, left tibia and fibula, and left foot, was performed according to the standard protocol following intravenous contrast administration. CONTRAST:  75mL OMNIPAQUE IOHEXOL 300 MG/ML  SOLN COMPARISON:  Same day ultrasound, left hip MRI 04/26/2019 FINDINGS: Bones/Joint/Cartilage There are chronic fractures of the left superior and inferior pubic rami. There is no evidence of left femur fracture. 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 There is no evidence of acute fracture. 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 There is moderate tibiotalar osteoarthritis and degenerative changes throughout the foot. Diffuse osteopenia. There is no osseous destruction or periostitis to suggest osteomyelitis. Partially visualized right knee arthroplasty hardware. Muscles and Tendons There is a heterogeneous collection along the iliopsoas muscle of the left hip, with  heterotopic ossification, measuring up to 5.0 x 3.3 cm in the axial dimension. There is also a heterogeneous collection extending within the abductor longus muscle measuring up to 4.2 x 3.4 cm in the axial dimension. There is moderate-severe atrophy of the lower leg musculature, and a intramuscular lipoma involving the medial head gastrocnemius measuring 4.1 x 1.7 cm. Soft tissues There is a coarse calcification along the lateral soft tissues at the level of the distal femur, which is favored to represent sequela of prior chronic hematoma. Massive localized lymphedema of the upper inner thigh. There is extensive soft tissue swelling of the left lower extremity with skin thickening, relatively sparing the anterior thigh. There is irregular skin thickening with subcutaneous water density collection which extends along the skin surface of the posterior distal thigh and posteriorly along the knee. There is diffuse skin thickening and soft tissue swelling of the lower leg as well extending through the ankle and foot. There is no soft tissue gas. Partially visualized low right lower extremity edema. Partially visualized pelvic structures are unremarkable. IMPRESSION: Heterogeneous collections along the distal iliopsoas muscle and abductor longus muscles measuring up to 5.0 and 4.2 cm short axis respectively, extending along the length of the muscles. These are favored to represent  hematomas, however infection cannot be completely excluded. Extensive skin thickening and subcutaneous soft tissue swelling involving the thigh, lower leg, ankle and foot as can be seen in lymphedema or cellulitis. No evidence of osteomyelitis. No deep intramuscular compartment involvement of the lower leg. Massive localized lymphedema along the upper inner thigh. The extensive complex collection along the posterior aspect of the knee superficially recently evaluated on ultrasound is also favored to represent massive localized lymphedema.  Electronically Signed   By: Caprice Renshaw   On: 07/24/2021 14:28   CT FOOT LEFT W CONTRAST  Result Date: 07/24/2021 CLINICAL DATA:  Soft tissue infection suspected, hip, xray done EXAM: CT OF THE LOWER RIGHT EXTREMITY WITH CONTRAST TECHNIQUE: Multidetector CT imaging of the lower right extremity including the left femur, left tibia and fibula, and left foot, was performed according to the standard protocol following intravenous contrast administration. CONTRAST:  75mL OMNIPAQUE IOHEXOL 300 MG/ML  SOLN COMPARISON:  Same day ultrasound, left hip MRI 04/26/2019 FINDINGS: Bones/Joint/Cartilage There are chronic fractures of the left superior and inferior pubic rami. There is no evidence of left femur fracture. 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 There is no evidence of acute fracture. 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 There is moderate tibiotalar osteoarthritis and degenerative changes throughout the foot. Diffuse osteopenia. There is no osseous destruction or periostitis to suggest osteomyelitis. Partially visualized right knee arthroplasty hardware. Muscles and Tendons There is a heterogeneous collection along the iliopsoas muscle of the left hip, with heterotopic ossification, measuring up to 5.0 x 3.3 cm in the axial dimension. There is also a heterogeneous collection extending within the abductor longus muscle measuring up to 4.2 x 3.4 cm in the axial dimension. There is moderate-severe atrophy of the lower leg musculature, and a intramuscular lipoma involving the medial head gastrocnemius measuring 4.1 x 1.7 cm. Soft tissues There is a coarse calcification along the lateral soft tissues at the level of the distal femur, which is favored to represent sequela of prior chronic hematoma. Massive localized lymphedema of the upper inner thigh. There is extensive soft tissue swelling of the left lower extremity with  skin thickening, relatively sparing the anterior thigh. There is irregular skin thickening with subcutaneous water density collection which extends along the skin surface of the posterior distal thigh and posteriorly along the knee. There is diffuse skin thickening and soft tissue swelling of the lower leg as well extending through the ankle and foot. There is no soft tissue gas. Partially visualized low right lower extremity edema. Partially visualized pelvic structures are unremarkable. IMPRESSION: Heterogeneous collections along the distal iliopsoas muscle and abductor longus muscles measuring up to 5.0 and 4.2 cm short axis respectively, extending along the length of the muscles. These are favored to represent hematomas, however infection cannot be completely excluded. Extensive skin thickening and subcutaneous soft tissue swelling involving the thigh, lower leg, ankle and foot as can be seen in lymphedema or cellulitis. No evidence of osteomyelitis. No deep intramuscular compartment involvement of the lower leg. Massive localized lymphedema along the upper inner thigh. The extensive complex collection along the posterior aspect of the knee superficially recently evaluated on ultrasound is also favored to represent massive localized lymphedema. Electronically Signed   By: Caprice Renshaw   On: 07/24/2021 14:28   US Venous Img Lower Unilateral Left  Result Date: 07/24/2021 CLINICAL DATA:  87 year old female with left lower extremity pain and edema for 1 week EXAM: LEFT  LOWER EXTREMITY VENOUS DOPPLER ULTRASOUND TECHNIQUE: Gray-scale sonography with compression, as well as color and duplex ultrasound, were performed to evaluate the deep venous system(s) from the level of the common femoral vein through the popliteal and proximal calf veins. COMPARISON:  None. FINDINGS: VENOUS Normal compressibility of the common femoral, superficial femoral, and popliteal veins, as well as the visualized calf veins. Visualized  portions of profunda femoral vein and great saphenous vein unremarkable. No filling defects to suggest DVT on grayscale or color Doppler imaging. Doppler waveforms show normal direction of venous flow, normal respiratory plasticity and response to augmentation. Limited views of the contralateral common femoral vein are unremarkable. OTHER Large left superficial inguinal lymph node measuring 7.9 x 2.6 x 4.7 cm. The cortex is diffusely thickened. Additionally, there is a large highly complex spongiform cystic mass in the superficial soft tissues of the popliteal fossa just deep to the skin surface. The mass measures approximately 8.7 x 2.5 x 8.3 cm. No definite internal vascularity. Limitations: none IMPRESSION: 1. No evidence of deep venous thrombosis in the left lower extremity. 2. Prominent left inguinal lymphadenopathy may be reactive or reflective of an underlying lymphoproliferative process. Recommend clinical correlation for evidence of additional sites of lymphadenopathy. 3. Large complex spongiform mass in the superficial aspect of the popliteal fossa. The imaging appearance is nonspecific. Differential considerations include hematoma, highly complex Baker's cyst, and potentially a soft tissue neoplasm such as a soft tissue sarcoma. The imaging appearance would be atypical for a Baker's cyst. If the patient has a recent history of injury or muscle strain and there is visible bruising, then hematoma would be favored. If the lesion is palpable, and firm, then concern for soft tissue sarcoma would be elevated. If further imaging is clinically warranted, MRI of the knee with and without gadolinium contrast would be the test of choice. Electronically Signed   By: Malachy Moan M.D.   On: 07/24/2021 12:36   US Venous Img Lower Unilateral Left  Result Date: 07/14/2021 CLINICAL DATA:  Lower extremity pain for 4 days. History of left lower extremity DVT. EXAM: LEFT LOWER EXTREMITY VENOUS DOPPLER ULTRASOUND  TECHNIQUE: Gray-scale sonography with compression, as well as color and duplex ultrasound, were performed to evaluate the deep venous system(s) from the level of the common femoral vein through the popliteal and proximal calf veins. COMPARISON:  06/19/2015 FINDINGS: VENOUS Normal compressibility of the common femoral, superficial femoral, and popliteal veins, as well as the visualized calf veins. Visualized portions of profunda femoral vein and great saphenous vein unremarkable. No filling defects to suggest DVT on grayscale or color Doppler imaging. Mild wall thickening of the left common femoral vein that may represent sequela of prior DVT, however no thrombus is visualized. Doppler waveforms show normal direction of venous flow, normal respiratory plasticity and response to augmentation. Limited views of the contralateral common femoral vein are unremarkable. OTHER None. Limitations: none IMPRESSION: No evidence of left lower extremity DVT. Electronically Signed   By: Narda Rutherford M.D.   On: 07/14/2021 17:29   DG HIP UNILAT WITH PELVIS 2-3 VIEWS LEFT  Result Date: 07/14/2021 CLINICAL DATA:  Hip pain since Saturday, no known injury EXAM: DG HIP (WITH OR WITHOUT PELVIS) 2-3V LEFT COMPARISON:  04/02/2019 FINDINGS: Osseous demineralization. Mild narrowing of the hip joints bilaterally. SI joints preserved. Old fractures of the LEFT superior and inferior pubic rami. No acute fracture, dislocation, or bone destruction. IMPRESSION: Osseous demineralization with old LEFT pubic fractures. No acute osseous abnormalities. Electronically Signed  By: Ulyses Southward M.D.   On: 07/14/2021 12:35    Patient seen by me along with physician assistant.  Patient was recently admitted to [facility] CT scans of the legs raise concerns about hematoma along the muscle.  Patient was admitted and observed.  Patient's labs are stable here today.  Low worsening renal function.  Patient is followed by [facility]  medicine.  Patient's had trouble in the past due to being on lots of prednisone and also being on blood thinners which she is currently off temporarily.  With bleeding.  I think there is no significant change in her lower extremity compared from before.  Would recommend compression stockings however there is probably 5 x 5 cm bolus area just at the very proximal part of the calf posteriorly which with the compression stockings may rupture.  So best just may be to do leg elevation.  And then follow-up with primary care doctors in [place].   Vanetta Mulders, MD 07/26/21 2010

## 2021-07-28 ENCOUNTER — Encounter (HOSPITAL_COMMUNITY): Payer: Self-pay | Admitting: Emergency Medicine

## 2021-08-26 ENCOUNTER — Ambulatory Visit: Payer: Medicare Other | Admitting: "Endocrinology

## 2022-03-24 DIAGNOSIS — M76829 Posterior tibial tendinitis, unspecified leg: Secondary | ICD-10-CM | POA: Insufficient documentation

## 2022-03-24 DIAGNOSIS — L600 Ingrowing nail: Secondary | ICD-10-CM | POA: Insufficient documentation

## 2022-03-24 DIAGNOSIS — M2010 Hallux valgus (acquired), unspecified foot: Secondary | ICD-10-CM | POA: Insufficient documentation

## 2022-03-24 DIAGNOSIS — M79673 Pain in unspecified foot: Secondary | ICD-10-CM | POA: Insufficient documentation

## 2022-03-24 DIAGNOSIS — E1142 Type 2 diabetes mellitus with diabetic polyneuropathy: Secondary | ICD-10-CM | POA: Insufficient documentation

## 2022-05-20 ENCOUNTER — Encounter (INDEPENDENT_AMBULATORY_CARE_PROVIDER_SITE_OTHER): Payer: Self-pay | Admitting: Nurse Practitioner

## 2022-05-20 ENCOUNTER — Ambulatory Visit (INDEPENDENT_AMBULATORY_CARE_PROVIDER_SITE_OTHER): Payer: Medicare Other | Admitting: Nurse Practitioner

## 2022-05-20 VITALS — Resp 17 | Ht 66.0 in | Wt 150.0 lb

## 2022-05-20 DIAGNOSIS — M79605 Pain in left leg: Secondary | ICD-10-CM

## 2022-05-20 DIAGNOSIS — I10 Essential (primary) hypertension: Secondary | ICD-10-CM

## 2022-05-20 DIAGNOSIS — E782 Mixed hyperlipidemia: Secondary | ICD-10-CM | POA: Diagnosis not present

## 2022-05-20 DIAGNOSIS — M79604 Pain in right leg: Secondary | ICD-10-CM

## 2022-05-26 ENCOUNTER — Encounter (INDEPENDENT_AMBULATORY_CARE_PROVIDER_SITE_OTHER): Payer: Self-pay | Admitting: Nurse Practitioner

## 2022-05-26 NOTE — Progress Notes (Signed)
Subjective:    Patient ID: Linda Hess, female    DOB: September 13, 1931, 88 y.o.   MRN: [id number] Chief Complaint  Patient presents with   Establish Care     Referred by dr Bartolo Darter    The patient is seen for evaluation of painful lower extremities. Patient notes the pain is variable and not always associated with activity.  The pain is somewhat consistent day to day occurring on most days. The patient notes the pain also occurs with standing and routinely seems worse as the day wears on. The pain has been progressive over the past several years. The patient states these symptoms are causing  a negative impact on quality of life and daily activities which was a factor in the referral.  The patient has a  history of back problems and DJD of the lumbar and sacral spine.   The patient denies rest pain or dangling of an extremity off the side of the bed during the night for relief. No open wounds or sores at this time. There is a history of DVT. No prior vascular interventions or surgeries.      Review of Systems  Cardiovascular:  Positive for leg swelling.  All other systems reviewed and are negative.     Objective:   Physical Exam Vitals reviewed.  HENT:     Head: Normocephalic.  Cardiovascular:     Rate and Rhythm: Normal rate.     Pulses: Normal pulses.  Pulmonary:     Effort: Pulmonary effort is normal.  Skin:    General: Skin is warm and dry.  Neurological:     Mental Status: She is alert and oriented to person, place, and time.  Psychiatric:        Mood and Affect: Mood normal.        Behavior: Behavior normal.        Thought Content: Thought content normal.        Judgment: Judgment normal.    Resp 17   Ht 5\' 6"  (1.676 m)   Wt 150 lb (68 kg)   BMI 24.21 kg/m   Past Medical History:  Diagnosis Date   CIDP (chronic inflammatory demyelinating polyneuropathy) (HCC)    Essential hypertension, benign    pt denies   History of DVT (deep vein thrombosis)     Reportedly left leg, details not clear   Mixed hyperlipidemia    Type 2 diabetes mellitus ([place])     Social History   Socioeconomic History   Marital status: Widowed    Spouse name: Not on file   Number of children: Not on file   Years of education: Not on file   Highest education level: Not on file  Occupational History   Not on file  Tobacco Use   Smoking status: Never   Smokeless tobacco: Never  Vaping Use   Vaping Use: Never used  Substance and Sexual Activity   Alcohol use: No   Drug use: No   Sexual activity: Not Currently  Other Topics Concern   Not on file  Social History Narrative   ** Merged History Encounter **       Social Determinants of Health   Financial Resource Strain: Not on file  Food Insecurity: Not on file  Transportation Needs: Not on file  Physical Activity: Not on file  Stress: Not on file  Social Connections: Not on file  Intimate Partner Violence: Not on file    Past Surgical History:  Procedure Laterality  Date   CHOLECYSTECTOMY     Left quadriceps muscle biopsy  March 2010   Right knee replacement  September 2014    Family History  Problem Relation Age of Onset   Heart attack Father    Stroke Mother    Cancer Brother     Allergies  Allergen Reactions   Amoxicillin-Pot Clavulanate Hives   Other Hives and Itching    RED MEAT   Penicillins     hives       Latest Ref Rng & Units 07/26/2021    3:41 PM 07/25/2021   11:22 AM 07/25/2021    6:37 AM  CBC  WBC 4.0 - 10.5 K/uL 12.9    9.1    Hemoglobin 12.0 - 15.0 g/dL 11.8   11.3   10.9    Hematocrit 36.0 - 46.0 % 36.7   35.8   34.7    Platelets 150 - 400 K/uL 156    158        CMP     Component Value Date/Time   NA 130 (L) 07/26/2021 1541   NA 139 08/30/2013 0508   K 4.8 07/26/2021 1541   K 4.3 08/31/2013 0443   CL 99 07/26/2021 1541   CL 107 08/30/2013 0508   CO2 22 07/26/2021 1541   CO2 26 08/30/2013 0508   GLUCOSE 474 (H) 07/26/2021 1541   GLUCOSE 164 (H)  08/30/2013 0508   BUN 27 (H) 07/26/2021 1541   BUN 36 (A) 04/07/2021 0000   BUN 27 (H) 08/30/2013 0508   CREATININE 1.31 (H) 07/26/2021 1541   CREATININE 1.34 (H) 04/28/2016 0925   CALCIUM 8.3 (L) 07/26/2021 1541   CALCIUM 9.1 08/30/2013 0508   PROT 5.8 (L) 07/24/2021 1005   ALBUMIN 3.3 (L) 07/24/2021 1005   AST 17 07/24/2021 1005   ALT 18 07/24/2021 1005   ALKPHOS 61 07/24/2021 1005   BILITOT 2.5 (H) 07/24/2021 1005   GFRNONAA 39 (L) 07/26/2021 1541   GFRNONAA 52 (L) 08/30/2013 0508   GFRAA 40 04/07/2021 0000   GFRAA >60 08/30/2013 0508     No results found.     Assessment & Plan:   1. Leg pain, bilateral  Recommend:  The patient has atypical pain symptoms for pure atherosclerotic disease. However, on physical exam there is evidence of vascular disease, given the diminished pulses and the edema associated with venous changes of the legs.  Further investigation of the patient's vascular disease is necessary to determine the relationship of the patient's lower extremity symptoms and the degree of vascular disease.  Noninvasive studies of the will be obtained and the patient will follow up with me to review these studies.  I suspect the patient is c/o pseudoclaudication.  Patient should have an evaluation of his LS spine which I defer to either the primary service or the Spine service.  The patient should continue walking and begin a more formal exercise program. The patient should continue his antiplatelet therapy and aggressive treatment of the lipid abnormalities.   2. Mixed hyperlipidemia Continue statin as ordered and reviewed, no changes at this time   3. Essential hypertension, benign Continue antihypertensive medications as already ordered, these medications have been reviewed and there are no changes at this time.    Current Outpatient Medications on File Prior to Visit  Medication Sig Dispense Refill   acetaminophen (TYLENOL) 325 MG tablet Take 325 mg by mouth  every 6 (six) hours as needed for mild pain.  calcium-vitamin D (OSCAL WITH D) 500-200 MG-UNIT per tablet Take 1 tablet by mouth at bedtime.      gabapentin (NEURONTIN) 100 MG capsule Take 100 mg by mouth 3 (three) times daily.      JANUVIA 100 MG tablet Take 100 mg by mouth daily.     metFORMIN (GLUCOPHAGE-XR) 500 MG 24 hr tablet Take 1 tablet (500 mg total) by mouth daily with breakfast. 30 tablet 5   Omega-3 Fatty Acids (FISH OIL) 1200 MG CAPS Take 1 capsule by mouth every morning.      predniSONE (DELTASONE) 10 MG tablet Take 1 tablet (10 mg total) by mouth daily with breakfast. 30 tablet 3   Rivaroxaban (XARELTO) 15 MG TABS tablet Take 1 tablet (15 mg total) by mouth daily with supper. 30 tablet 3   rosuvastatin (CRESTOR) 10 MG tablet Take 10 mg by mouth daily.     traMADol (ULTRAM) 50 MG tablet Take 50 mg by mouth every 6 (six) hours as needed for moderate pain.      vitamin C (ASCORBIC ACID) 500 MG tablet Take 500 mg by mouth daily.     No current facility-administered medications on file prior to visit.    There are no Patient Instructions on file for this visit. No follow-ups on file.   Kris Hartmann, NP

## 2022-06-24 ENCOUNTER — Encounter (INDEPENDENT_AMBULATORY_CARE_PROVIDER_SITE_OTHER): Payer: Medicare Other

## 2022-06-24 ENCOUNTER — Ambulatory Visit (INDEPENDENT_AMBULATORY_CARE_PROVIDER_SITE_OTHER): Payer: Medicare Other | Admitting: Nurse Practitioner

## 2022-07-02 ENCOUNTER — Other Ambulatory Visit (INDEPENDENT_AMBULATORY_CARE_PROVIDER_SITE_OTHER): Payer: Self-pay | Admitting: Nurse Practitioner

## 2022-07-02 DIAGNOSIS — M79604 Pain in right leg: Secondary | ICD-10-CM

## 2022-07-03 ENCOUNTER — Ambulatory Visit (INDEPENDENT_AMBULATORY_CARE_PROVIDER_SITE_OTHER): Payer: Medicare Other | Admitting: Nurse Practitioner

## 2022-07-03 ENCOUNTER — Ambulatory Visit (INDEPENDENT_AMBULATORY_CARE_PROVIDER_SITE_OTHER): Payer: Medicare Other

## 2022-07-03 VITALS — BP 130/62 | HR 84 | Resp 16 | Wt 147.2 lb

## 2022-07-03 DIAGNOSIS — M79605 Pain in left leg: Secondary | ICD-10-CM

## 2022-07-03 DIAGNOSIS — M79604 Pain in right leg: Secondary | ICD-10-CM

## 2022-07-03 DIAGNOSIS — I10 Essential (primary) hypertension: Secondary | ICD-10-CM | POA: Diagnosis not present

## 2022-07-04 ENCOUNTER — Encounter (INDEPENDENT_AMBULATORY_CARE_PROVIDER_SITE_OTHER): Payer: Self-pay | Admitting: Nurse Practitioner

## 2022-07-04 NOTE — Progress Notes (Signed)
Subjective:    Patient ID: Linda Hess, female    DOB: August 27, 1931, 88 y.o.   MRN: [id number] Chief Complaint  Patient presents with   Follow-up    Ultrasound follow up    Linda Hess is an 88 year old female that returns today for follow-up evaluation of lower extremity edema and pain.  She notes that since her last office visit the pain has resolved and she has been wearing a medical grade compression stockings on a regular basis.  She also notes that the swelling is under much better control as well.  There are no open wounds or ulcerations.  Patient denies having any weeping during this time either.  Today noninvasive studies show no evidence of DVT or superficial venous reflux bilaterally.  No evidence of deep venous insufficiency.  The patient was unable to tolerate having ABIs done but limited lower extremity arterial duplex shows biphasic waveforms at the bilateral tibial vessels    Review of Systems  Cardiovascular:  Positive for leg swelling.  All other systems reviewed and are negative.      Objective:   Physical Exam Vitals reviewed.  HENT:     Head: Normocephalic.  Cardiovascular:     Rate and Rhythm: Normal rate.     Pulses: Normal pulses.  Pulmonary:     Effort: Pulmonary effort is normal.  Musculoskeletal:     Right lower leg: Edema present.     Left lower leg: Edema present.  Skin:    General: Skin is warm and dry.  Neurological:     Mental Status: She is alert and oriented to person, place, and time.     Motor: Weakness present.  Psychiatric:        Mood and Affect: Mood normal.        Behavior: Behavior normal.        Thought Content: Thought content normal.        Judgment: Judgment normal.     BP 130/62 (BP Location: Left Arm)   Pulse 84   Resp 16   Wt 147 lb 3.2 oz (66.8 kg)   BMI 23.76 kg/m   Past Medical History:  Diagnosis Date   CIDP (chronic inflammatory demyelinating polyneuropathy) (HCC)    Essential hypertension, benign     pt denies   History of DVT (deep vein thrombosis)    Reportedly left leg, details not clear   Mixed hyperlipidemia    Type 2 diabetes mellitus (HCC)     Social History   Socioeconomic History   Marital status: Widowed    Spouse name: Not on file   Number of children: Not on file   Years of education: Not on file   Highest education level: Not on file  Occupational History   Not on file  Tobacco Use   Smoking status: Never   Smokeless tobacco: Never  Vaping Use   Vaping Use: Never used  Substance and Sexual Activity   Alcohol use: No   Drug use: No   Sexual activity: Not Currently  Other Topics Concern   Not on file  Social History Narrative   ** Merged History Encounter **       Social Determinants of Health   Financial Resource Strain: Low Risk  (12/14/2018)   Overall Financial Resource Strain (CARDIA)    Difficulty of Paying Living Expenses: Not hard at all  Food Insecurity: Unknown (12/14/2018)   Hunger Vital Sign    Worried About Running Out of Food in the  Last Year: Patient refused    Ran Out of Food in the Last Year: Patient refused  Transportation Needs: Unknown (12/14/2018)   PRAPARE - Transportation    Lack of Transportation (Medical): Patient refused    Lack of Transportation (Non-Medical): Patient refused  Physical Activity: Unknown (12/14/2018)   Exercise Vital Sign    Days of Exercise per Week: Patient refused    Minutes of Exercise per Session: Patient refused  Stress: No Stress Concern Present (12/14/2018)   [employer] of Occupational Health - Occupational Stress Questionnaire    Feeling of Stress : Not at all  Social Connections: Somewhat Isolated (12/14/2018)   Social Connection and Isolation Panel [NHANES]    Frequency of Communication with Friends and Family: More than three times a week    Frequency of Social Gatherings with Friends and Family: More than three times a week    Attends Religious Services: More than 4 times per year     Active Member of [employer] or Organizations: No    Attends Banker Meetings: Never    Marital Status: Widowed  Intimate Partner Violence: Unknown (12/14/2018)   Humiliation, Afraid, Rape, and Kick questionnaire    Fear of Current or Ex-Partner: Patient refused    Emotionally Abused: Patient refused    Physically Abused: Patient refused    Sexually Abused: Patient refused    Past Surgical History:  Procedure Laterality Date   CHOLECYSTECTOMY     Left quadriceps muscle biopsy  March 2010   Right knee replacement  September 2014    Family History  Problem Relation Age of Onset   Heart attack Father    Stroke Mother    Cancer Brother     Allergies  Allergen Reactions   Amoxicillin-Pot Clavulanate Hives   Other Hives and Itching    RED MEAT   Penicillins     hives Other reaction(s): rash       Latest Ref Rng & Units 07/26/2021    3:41 PM 07/25/2021   11:22 AM 07/25/2021    6:37 AM  CBC  WBC 4.0 - 10.5 K/uL 12.9   9.1   Hemoglobin 12.0 - 15.0 g/dL 73.4  28.7  68.1   Hematocrit 36.0 - 46.0 % 36.7  35.8  34.7   Platelets 150 - 400 K/uL 156   158       CMP     Component Value Date/Time   NA 130 (L) 07/26/2021 1541   NA 139 08/30/2013 0508   K 4.8 07/26/2021 1541   K 4.3 08/31/2013 0443   CL 99 07/26/2021 1541   CL 107 08/30/2013 0508   CO2 22 07/26/2021 1541   CO2 26 08/30/2013 0508   GLUCOSE 474 (H) 07/26/2021 1541   GLUCOSE 164 (H) 08/30/2013 0508   BUN 27 (H) 07/26/2021 1541   BUN 36 (A) 04/07/2021 0000   BUN 27 (H) 08/30/2013 0508   CREATININE 1.31 (H) 07/26/2021 1541   CREATININE 1.34 (H) 04/28/2016 0925   CALCIUM 8.3 (L) 07/26/2021 1541   CALCIUM 9.1 08/30/2013 0508   PROT 5.8 (L) 07/24/2021 1005   ALBUMIN 3.3 (L) 07/24/2021 1005   AST 17 07/24/2021 1005   ALT 18 07/24/2021 1005   ALKPHOS 61 07/24/2021 1005   BILITOT 2.5 (H) 07/24/2021 1005   GFRNONAA 39 (L) 07/26/2021 1541   GFRNONAA 52 (L) 08/30/2013 0508   GFRAA 40 04/07/2021 0000    GFRAA >60 08/30/2013 0508     No results found.  Assessment & Plan:   1. Bilateral leg pain It appears that the patient's pain has resolved with the swelling.  The patient notes that the use of compression socks and elevation has greatly helped her swelling and discomfort.  Following discussion with the patient and family we will have her follow-up with [place] on an as-needed basis if the swelling returns or is beyond conservative measures.   2. Essential hypertension, benign Continue antihypertensive medications as already ordered, these medications have been reviewed and there are no changes at this time.    Current Outpatient Medications on File Prior to Visit  Medication Sig Dispense Refill   acetaminophen (TYLENOL) 325 MG tablet Take 325 mg by mouth every 6 (six) hours as needed for mild pain.     calcium-vitamin D (OSCAL WITH D) 500-200 MG-UNIT per tablet Take 1 tablet by mouth at bedtime.      gabapentin (NEURONTIN) 100 MG capsule Take 100 mg by mouth 3 (three) times daily.      JANUVIA 100 MG tablet Take 100 mg by mouth daily.     metFORMIN (GLUCOPHAGE-XR) 500 MG 24 hr tablet Take 1 tablet (500 mg total) by mouth daily with breakfast. 30 tablet 5   Omega-3 Fatty Acids (FISH OIL) 1200 MG CAPS Take 1 capsule by mouth every morning.      predniSONE (DELTASONE) 10 MG tablet Take 1 tablet (10 mg total) by mouth daily with breakfast. 30 tablet 3   Rivaroxaban (XARELTO) 15 MG TABS tablet Take 1 tablet (15 mg total) by mouth daily with supper. 30 tablet 3   rosuvastatin (CRESTOR) 10 MG tablet Take 10 mg by mouth daily.     traMADol (ULTRAM) 50 MG tablet Take 50 mg by mouth every 6 (six) hours as needed for moderate pain.      vitamin C (ASCORBIC ACID) 500 MG tablet Take 500 mg by mouth daily.     No current facility-administered medications on file prior to visit.    There are no Patient Instructions on file for this visit. No follow-ups on file.   Kris Hartmann, NP

## 2022-10-06 DIAGNOSIS — M25551 Pain in right hip: Secondary | ICD-10-CM | POA: Insufficient documentation

## 2022-10-06 DIAGNOSIS — M25559 Pain in unspecified hip: Secondary | ICD-10-CM | POA: Insufficient documentation

## 2022-10-06 DIAGNOSIS — M48061 Spinal stenosis, lumbar region without neurogenic claudication: Secondary | ICD-10-CM | POA: Insufficient documentation

## 2022-10-06 DIAGNOSIS — M47816 Spondylosis without myelopathy or radiculopathy, lumbar region: Secondary | ICD-10-CM | POA: Insufficient documentation

## 2022-10-06 DIAGNOSIS — M5440 Lumbago with sciatica, unspecified side: Secondary | ICD-10-CM | POA: Insufficient documentation

## 2022-10-09 ENCOUNTER — Ambulatory Visit (HOSPITAL_COMMUNITY)
Admission: RE | Admit: 2022-10-09 | Discharge: 2022-10-09 | Disposition: A | Discharge status: Home / Self Care | Payer: Medicare Other | Source: Ambulatory Visit | Attending: Orthopedic Surgery | Admitting: Orthopedic Surgery

## 2022-10-09 ENCOUNTER — Ambulatory Visit (INDEPENDENT_AMBULATORY_CARE_PROVIDER_SITE_OTHER): Payer: Medicare Other | Admitting: Orthopedic Surgery

## 2022-10-09 ENCOUNTER — Encounter: Payer: Self-pay | Admitting: Orthopedic Surgery

## 2022-10-09 VITALS — BP 122/64 | HR 94 | Ht 66.0 in | Wt 149.0 lb

## 2022-10-09 DIAGNOSIS — M25552 Pain in left hip: Secondary | ICD-10-CM

## 2022-10-09 NOTE — Patient Instructions (Signed)
Go today now for the CT scan they are working you in so you may have to wait a bit for the scan Linda Hess, Radiology

## 2022-10-09 NOTE — Progress Notes (Signed)
Chief Complaint  Patient presents with   New Patient (Initial Visit)   Hip Pain    LT hip Pain started 09/19/2009   History 88 year old female with CIPD which is some type of demyelinating polyneuropathy which she has had for 10 years a rare disease presents with left hip pain which started about a month ago.  The patient was in the store and came out of the store try to get in her car and could not lift her left leg.  She had x-rays at [facility] on September 28, 2022 she was told she had a hip fracture and it would take 6 to 8 weeks to heal.  However appointment was scheduled for today.  The images have been reviewed I do not see an obvious fracture.  Her exam is not consistent with hip fracture at this time but further imaging is required  Past Medical History:  Diagnosis Date   CIDP (chronic inflammatory demyelinating polyneuropathy) (HCC)    Essential hypertension, benign    pt denies   History of DVT (deep vein thrombosis)    Reportedly left leg, details not clear   Mixed hyperlipidemia    Type 2 diabetes mellitus (HCC)    Past Surgical History:  Procedure Laterality Date   CHOLECYSTECTOMY     Left quadriceps muscle biopsy  March 2010   Right knee replacement  September 2014   Social History   Tobacco Use   Smoking status: Never   Smokeless tobacco: Never  Vaping Use   Vaping Use: Never used  Substance Use Topics   Alcohol use: No   Drug use: No   Family History  Problem Relation Age of Onset   Heart attack Father    Stroke Mother    Cancer Brother    No orders of the defined types were placed in this encounter.  Current Outpatient Medications  Medication Instructions   acetaminophen (TYLENOL) 325 mg, Oral, Every 6 hours PRN   ascorbic acid (VITAMIN C) 500 mg, Oral, Daily   calcium-vitamin D (OSCAL WITH D) 500-200 MG-UNIT per tablet 1 tablet, Oral, Daily at bedtime   gabapentin (NEURONTIN) 100 mg, Oral, 3 times daily   Januvia 100 mg, Oral,  Daily   metFORMIN (GLUCOPHAGE-XR) 500 mg, Oral, Daily with breakfast   Omega-3 Fatty Acids (FISH OIL) 1200 MG CAPS 1 capsule, Oral, Every morning   predniSONE (DELTASONE) 10 mg, Oral, Daily with breakfast   Rivaroxaban (XARELTO) 15 mg, Oral, Daily with supper   rosuvastatin (CRESTOR) 10 mg, Oral, Daily   traMADol (ULTRAM) 50 mg, Oral, Every 6 hours PRN   BP 122/64   Pulse 94   Ht 5\' 6"  (1.676 m)   Wt 149 lb (67.6 kg) Comment: verbally pt can't stand  BMI 24.05 kg/m   General appearance patient is normal development grooming and hygiene.  Cardiovascular exam she has severe bilateral pitting edema up to the knee area,  Skin has various areas of scarring and is very paperthin in both legs  Musculoskeletal her leg lengths are equal she has tenderness over the greater troches but I can move her hip without any pain in the hip area  Neurologically she is awake alert and oriented x3 mood and affect are normal she has pain sensation at least in the lower extremities  Imaging was from an outside facility is on a disc she has calcification along the medial side of the proximal femur may be muscular or vascular she also has normal head  neck shaft angle.  She also has a line running through the greater trochanter which is questionable whether is a fracture only seen on one view  Discussed with her and her son.  Recommend a CT scan to confirm whether the fracture is indeed present and then at what stage of healing it is and if present and then whether or not it needs surgery  This surgery may need to be done at a tertiary care facility

## 2022-10-12 ENCOUNTER — Telehealth: Payer: Self-pay | Admitting: Radiology

## 2022-10-12 DIAGNOSIS — I890 Lymphedema, not elsewhere classified: Secondary | ICD-10-CM | POA: Insufficient documentation

## 2022-10-12 NOTE — Telephone Encounter (Signed)
Patient called. LMVM wanting to know what her CT scan showed.  Please call her.

## 2022-10-12 NOTE — Telephone Encounter (Signed)
IMPRESSION: 1. No evidence of acute fracture or dislocation. 2. 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 3. Increasing heterotopic ossification within the soft tissues anterior to the left hip with mildly increased complex fluid within the left iliopsoas bursa. The other inflammatory changes previously seen within the adductor musculature of the left thigh have improved compared with the prior study.  She wants a call with results, when you can, let me know if you need anything from me in regards to this patient.

## 2022-10-13 NOTE — Telephone Encounter (Signed)
Patient called back and asked again about the results.  I advised her that there is a message in the system for Dr Aline Brochure to call her.

## 2022-10-14 ENCOUNTER — Other Ambulatory Visit: Payer: Self-pay | Admitting: Orthopedic Surgery

## 2022-10-14 DIAGNOSIS — M25552 Pain in left hip: Secondary | ICD-10-CM

## 2022-10-14 MED ORDER — ACETAMINOPHEN-CODEINE 300-30 MG PO TABS: 1.0000 | ORAL_TABLET | Freq: Four times a day (QID) | ORAL | 0 refills | Status: AC | PRN

## 2022-10-14 NOTE — Progress Notes (Signed)
Meds ordered this encounter  Medications  . acetaminophen-codeine (TYLENOL #3) 300-30 MG tablet    Sig: Take 1 tablet by mouth every 6 (six) hours as needed for up to 5 days for moderate pain.    Dispense:  20 tablet    Refill:  0    

## 2022-10-20 ENCOUNTER — Inpatient Hospital Stay
Admission: EM | Admit: 2022-10-20 | Discharge: 2022-10-26 | DRG: 291 | Disposition: A | Discharge status: Skilled Nursing Facility | Payer: Medicare Other | Attending: Internal Medicine | Admitting: Internal Medicine

## 2022-10-20 ENCOUNTER — Emergency Department: Payer: Medicare Other

## 2022-10-20 ENCOUNTER — Other Ambulatory Visit: Payer: Self-pay

## 2022-10-20 DIAGNOSIS — G6181 Chronic inflammatory demyelinating polyneuritis: Secondary | ICD-10-CM | POA: Diagnosis present

## 2022-10-20 DIAGNOSIS — I10 Essential (primary) hypertension: Secondary | ICD-10-CM | POA: Diagnosis present

## 2022-10-20 DIAGNOSIS — I130 Hypertensive heart and chronic kidney disease with heart failure and stage 1 through stage 4 chronic kidney disease, or unspecified chronic kidney disease: Principal | ICD-10-CM | POA: Diagnosis present

## 2022-10-20 DIAGNOSIS — M4856XA Collapsed vertebra, not elsewhere classified, lumbar region, initial encounter for fracture: Secondary | ICD-10-CM | POA: Diagnosis present

## 2022-10-20 DIAGNOSIS — M48061 Spinal stenosis, lumbar region without neurogenic claudication: Secondary | ICD-10-CM | POA: Diagnosis present

## 2022-10-20 DIAGNOSIS — I5033 Acute on chronic diastolic (congestive) heart failure: Secondary | ICD-10-CM | POA: Diagnosis present

## 2022-10-20 DIAGNOSIS — E1122 Type 2 diabetes mellitus with diabetic chronic kidney disease: Secondary | ICD-10-CM | POA: Diagnosis present

## 2022-10-20 DIAGNOSIS — E1151 Type 2 diabetes mellitus with diabetic peripheral angiopathy without gangrene: Secondary | ICD-10-CM | POA: Diagnosis present

## 2022-10-20 DIAGNOSIS — Z809 Family history of malignant neoplasm, unspecified: Secondary | ICD-10-CM

## 2022-10-20 DIAGNOSIS — E782 Mixed hyperlipidemia: Secondary | ICD-10-CM | POA: Diagnosis present

## 2022-10-20 DIAGNOSIS — Z880 Allergy status to penicillin: Secondary | ICD-10-CM

## 2022-10-20 DIAGNOSIS — M5441 Lumbago with sciatica, right side: Secondary | ICD-10-CM

## 2022-10-20 DIAGNOSIS — L89322 Pressure ulcer of left buttock, stage 2: Secondary | ICD-10-CM | POA: Diagnosis present

## 2022-10-20 DIAGNOSIS — Z79899 Other long term (current) drug therapy: Secondary | ICD-10-CM | POA: Diagnosis not present

## 2022-10-20 DIAGNOSIS — M25552 Pain in left hip: Principal | ICD-10-CM

## 2022-10-20 DIAGNOSIS — M5440 Lumbago with sciatica, unspecified side: Secondary | ICD-10-CM | POA: Diagnosis present

## 2022-10-20 DIAGNOSIS — Z7984 Long term (current) use of oral hypoglycemic drugs: Secondary | ICD-10-CM | POA: Diagnosis not present

## 2022-10-20 DIAGNOSIS — R109 Unspecified abdominal pain: Secondary | ICD-10-CM

## 2022-10-20 DIAGNOSIS — N1832 Chronic kidney disease, stage 3b: Secondary | ICD-10-CM | POA: Diagnosis present

## 2022-10-20 DIAGNOSIS — Z7952 Long term (current) use of systemic steroids: Secondary | ICD-10-CM | POA: Diagnosis not present

## 2022-10-20 DIAGNOSIS — M84452D Pathological fracture, left femur, subsequent encounter for fracture with routine healing: Secondary | ICD-10-CM | POA: Diagnosis present

## 2022-10-20 DIAGNOSIS — W19XXXA Unspecified fall, initial encounter: Secondary | ICD-10-CM | POA: Diagnosis present

## 2022-10-20 DIAGNOSIS — R10A2 Flank pain, left side: Secondary | ICD-10-CM

## 2022-10-20 DIAGNOSIS — I739 Peripheral vascular disease, unspecified: Secondary | ICD-10-CM | POA: Diagnosis present

## 2022-10-20 DIAGNOSIS — E1165 Type 2 diabetes mellitus with hyperglycemia: Secondary | ICD-10-CM | POA: Diagnosis not present

## 2022-10-20 DIAGNOSIS — Z823 Family history of stroke: Secondary | ICD-10-CM

## 2022-10-20 DIAGNOSIS — Z86718 Personal history of other venous thrombosis and embolism: Secondary | ICD-10-CM | POA: Diagnosis not present

## 2022-10-20 DIAGNOSIS — M79605 Pain in left leg: Secondary | ICD-10-CM | POA: Diagnosis present

## 2022-10-20 DIAGNOSIS — Z96651 Presence of right artificial knee joint: Secondary | ICD-10-CM | POA: Diagnosis present

## 2022-10-20 DIAGNOSIS — Z7901 Long term (current) use of anticoagulants: Secondary | ICD-10-CM

## 2022-10-20 DIAGNOSIS — Z8249 Family history of ischemic heart disease and other diseases of the circulatory system: Secondary | ICD-10-CM | POA: Diagnosis not present

## 2022-10-20 DIAGNOSIS — R262 Difficulty in walking, not elsewhere classified: Secondary | ICD-10-CM | POA: Diagnosis not present

## 2022-10-20 DIAGNOSIS — L8990 Pressure ulcer of unspecified site, unspecified stage: Secondary | ICD-10-CM | POA: Diagnosis present

## 2022-10-20 DIAGNOSIS — I5031 Acute diastolic (congestive) heart failure: Secondary | ICD-10-CM | POA: Diagnosis not present

## 2022-10-20 LAB — CBC
HCT: 42 % (ref 36.0–46.0)
Hemoglobin: 13 g/dL (ref 12.0–15.0)
MCH: 28.2 pg (ref 26.0–34.0)
MCHC: 31 g/dL (ref 30.0–36.0)
MCV: 91.1 fL (ref 80.0–100.0)
Platelets: 180 10*3/uL (ref 150–400)
RBC: 4.61 MIL/uL (ref 3.87–5.11)
RDW: 13.6 % (ref 11.5–15.5)
WBC: 13 10*3/uL — ABNORMAL HIGH (ref 4.0–10.5)
nRBC: 0 % (ref 0.0–0.2)

## 2022-10-20 LAB — BASIC METABOLIC PANEL
Anion gap: 10 (ref 5–15)
BUN: 35 mg/dL — ABNORMAL HIGH (ref 8–23)
CO2: 26 mmol/L (ref 22–32)
Calcium: 9.5 mg/dL (ref 8.9–10.3)
Chloride: 101 mmol/L (ref 98–111)
Creatinine, Ser: 1.58 mg/dL — ABNORMAL HIGH (ref 0.44–1.00)
GFR, Estimated: 31 mL/min — ABNORMAL LOW (ref >60)
Glucose, Bld: 306 mg/dL — ABNORMAL HIGH (ref 70–99)
Potassium: 4.2 mmol/L (ref 3.5–5.1)
Sodium: 137 mmol/L (ref 135–145)

## 2022-10-20 LAB — BRAIN NATRIURETIC PEPTIDE: B Natriuretic Peptide: 657.5 pg/mL — ABNORMAL HIGH (ref 0.0–100.0)

## 2022-10-20 MED ORDER — MORPHINE SULFATE (PF) 4 MG/ML IV SOLN
INTRAVENOUS | Status: AC
  Filled 2022-10-20: qty 1

## 2022-10-20 MED ORDER — ONDANSETRON HCL 4 MG/2ML IJ SOLN
4.0000 mg | INTRAMUSCULAR | Status: AC
  Administered 2022-10-20: 4 mg via INTRAVENOUS

## 2022-10-20 MED ORDER — SODIUM CHLORIDE 0.9 % IV BOLUS
500.0000 mL | Freq: Once | INTRAVENOUS | Status: AC
  Administered 2022-10-20: 500 mL via INTRAVENOUS

## 2022-10-20 MED ORDER — MORPHINE SULFATE (PF) 4 MG/ML IV SOLN
4.0000 mg | Freq: Once | INTRAVENOUS | Status: AC
  Administered 2022-10-20: 4 mg via INTRAVENOUS

## 2022-10-20 MED ORDER — ONDANSETRON HCL 4 MG/2ML IJ SOLN
INTRAMUSCULAR | Status: AC
  Filled 2022-10-20: qty 2

## 2022-10-20 NOTE — ED Provider Notes (Incomplete)
[name] Provider Note    Event Date/Time   First MD Initiated Contact with Patient 10/20/22 2105     (approximate)   History   Leg Swelling   HPI  Linda Hess is a 88 y.o. female with a history of chronic demyelinating illness, previous orthopedic injuries and a recent diagnosis of a "hip fracture" from a couple months ago   Review of outside notes including orthopedic note from October the notes that the patient was told at [name] that she had a hip fracture that would take 6 to 8 weeks to heal.  She followed up with orthopedics recommended she have a CT scan done in [place]  Today she was at home, and she and her son who are with her reports that she was getting up from bed when she started slid out and landed on her buttock  Physical Exam   Triage Vital Signs: ED Triage Vitals  Enc Vitals Group     BP 10/20/22 2003 126/71     Pulse Rate 10/20/22 2003 76     Resp 10/20/22 2003 18     Temp 10/20/22 2003 98.7 F (37.1 C)     Temp Source 10/20/22 2003 Oral     SpO2 10/20/22 2003 96 %     Weight 10/20/22 2008 149 lb (67.6 kg)     Height --      Head Circumference --      Peak Flow --      Pain Score 10/20/22 2004 0     Pain Loc --      Pain Edu? --      Excl. in GC? --     Most recent vital signs: Vitals:   10/20/22 2003  BP: 126/71  Pulse: 76  Resp: 18  Temp: 98.7 F (37.1 C)  SpO2: 96%     General: Awake, and screaming in pain, reports severe pain along the left hip and left lateral pelvic region. CV:  Good peripheral perfusion.  Normal heart tones Resp:  Normal effort.  Clear bilaterally with normal excursion.  No accessory muscle use.  No hypoxia Abd:  No distention.  Soft nontender nondistended throughout abdomen but notably tender along palpation of the left lower pelvic bony prominence, and also along the left upper hip. Other:  No deformities or bruising noted of the lower extremities  bilateral.  She does report tenderness to palpation of the left proximal hip without obvious shortening or rotation.  Palpable dorsalis pedis pulses in lower extremities, with moderate bilateral pitting edema and venous stasis changes without evidence of erythema or cellulitic change.  Patient and her son report skin changes and swelling in the lower legs is somewhat chronic in nature  Moves upper extremities without difficulty.  No midline cervical tenderness but does report some tenderness to the left paraspinous region.  Normocephalic atraumatic.  Fully alert and oriented.   ED Results / Procedures / Treatments   Labs (all labs ordered are listed, but only abnormal results are displayed) Labs Reviewed  BASIC METABOLIC PANEL - Abnormal; Notable for the following components:      Result Value   Glucose, Bld 306 (*)    BUN 35 (*)    Creatinine, Ser 1.58 (*)    GFR, Estimated 31 (*)    All other components within normal limits  CBC - Abnormal; Notable for the following components:   WBC 13.0 (*)    All other components  within normal limits  BRAIN NATRIURETIC PEPTIDE     RADIOLOGY  DG Femur Min 2 Views Left  Result Date: 10/20/2022 CLINICAL DATA:  fall, pain EXAM: LEFT FEMUR 2 VIEWS COMPARISON:  CT left hip 10/20/2022, x-ray right hip 10/20/2022. FINDINGS: Age-indeterminate comminuted left intertrochanteric fracture. No acute displaced fracture more distally. The knee is grossly unremarkable other than at least mild degenerative changes. No left hip dislocation. Partially visualized old healed left superior and inferior pubic rami fractures. Soft tissues are unremarkable. IMPRESSION: 1. No acute displaced fracture or dislocation. 2. Radiographically age-indeterminate comminuted left intertrochanteric fracture. Findings appears chronic on CT left hip 10/20/2022. Electronically Signed   By: Tish Frederickson M.D.   On: 10/20/2022 22:45   DG Chest 1 View  Result Date: 10/20/2022 CLINICAL  DATA:  Bilateral leg swelling and recent fall, initial encounter EXAM: CHEST  1 VIEW COMPARISON:  04/02/2019 FINDINGS: Cardiac shadow is enlarged but stable. Aortic calcifications are seen. The lungs are well aerated bilaterally. No bony abnormality is noted. IMPRESSION: No acute abnormality is noted. Electronically Signed   By: Alcide Clever M.D.   On: 10/20/2022 22:40   DG Femur Min 2 Views Right  Result Date: 10/20/2022 CLINICAL DATA:  Recent fall with right leg pain and swelling, initial encounter EXAM: RIGHT FEMUR 2 VIEWS COMPARISON:  None Available. FINDINGS: Right knee prosthesis is noted. Significant dystrophic calcification is noted along the infrapatellar ligament. No acute fracture or dislocation is noted. IMPRESSION: Chronic changes without acute abnormality. Electronically Signed   By: Alcide Clever M.D.   On: 10/20/2022 22:39   CT ABDOMEN PELVIS WO CONTRAST  Result Date: 10/20/2022 CLINICAL DATA:  Left-sided abdominal pain after fall, bilateral lower extremity edema EXAM: CT ABDOMEN AND PELVIS WITHOUT CONTRAST TECHNIQUE: Multidetector CT imaging of the abdomen and pelvis was performed following the standard protocol without IV contrast. Unenhanced CT was performed per clinician order. Lack of IV contrast limits sensitivity and specificity, especially for evaluation of abdominal/pelvic solid viscera. RADIATION DOSE REDUCTION: This exam was performed according to the departmental dose-optimization program which includes automated exposure control, adjustment of the mA and/or kV according to patient size and/or use of iterative reconstruction technique. COMPARISON:  10/09/2022 FINDINGS: Lower chest: No acute pleural or parenchymal lung disease. Hepatobiliary: Cholecystectomy. Unremarkable unenhanced appearance of the liver. Pancreas: Unremarkable unenhanced appearance. Spleen: Unremarkable unenhanced appearance. Adrenals/Urinary Tract: No urinary tract calculi or obstructive uropathy. The adrenals  and bladder are unremarkable. Stomach/Bowel: No bowel obstruction or ileus. Normal appendix right lower quadrant. No bowel wall thickening or inflammatory change. Small hiatal hernia. Vascular/Lymphatic: Aortic atherosclerosis. No enlarged abdominal or pelvic lymph nodes. Reproductive: Uterus and bilateral adnexa are unremarkable. Other: No free fluid or free intraperitoneal gas. Fat containing umbilical hernia. Musculoskeletal: Mild age indeterminate compression deformity involving the superior endplate of the L1 vertebral body, with approximately 10% loss of vertebral body height. This is likely chronic given lack of visualized fracture line or paraspinal edema. No acute bony abnormalities. Chronic healed left superior and inferior pubic rami are again identified. Stable bilateral hip osteoarthritis, with continued heterotopic ossification surrounding the left hip. Reconstructed images demonstrate no additional findings. IMPRESSION: 1. No acute intra-abdominal or intrapelvic process. 2. Age indeterminate compression deformity superior endplate L1, likely chronic. 3. Prior healed left superior and inferior pubic rami fractures unchanged. Stable bilateral hip osteoarthritis. 4. Fat containing umbilical hernia and small hiatal hernia as above. 5.  Aortic Atherosclerosis (ICD10-I70.0). Electronically Signed   By: Sharlet Salina M.D.   On:  10/20/2022 22:25   CT Head Wo Contrast  Result Date: 10/20/2022 CLINICAL DATA:  Status post fall. EXAM: CT HEAD WITHOUT CONTRAST TECHNIQUE: Contiguous axial images were obtained from the base of the skull through the vertex without intravenous contrast. RADIATION DOSE REDUCTION: This exam was performed according to the departmental dose-optimization program which includes automated exposure control, adjustment of the mA and/or kV according to patient size and/or use of iterative reconstruction technique. COMPARISON:  July 11, 2008 FINDINGS: Brain: There is mild cerebral atrophy  with widening of the extra-axial spaces and ventricular dilatation. There are areas of decreased attenuation within the white matter tracts of the supratentorial brain, consistent with microvascular disease changes. Vascular: There is mild to marked severity calcification of the bilateral cavernous carotid arteries. Skull: Normal. Negative for fracture or focal lesion. Sinuses/Orbits: No acute finding. Other: None. IMPRESSION: 1. No acute intracranial abnormality. 2. Generalized cerebral atrophy and microvascular disease changes of the supratentorial brain. Electronically Signed   By: Virgina Norfolk M.D.   On: 10/20/2022 22:22   CT Hip Left Wo Contrast  Result Date: 10/20/2022 CLINICAL DATA:  Fall leg swelling. EXAM: CT OF THE LEFT HIP WITHOUT CONTRAST TECHNIQUE: Multidetector CT imaging of the left hip was performed according to the standard protocol. Multiplanar CT image reconstructions were also generated. RADIATION DOSE REDUCTION: This exam was performed according to the departmental dose-optimization program which includes automated exposure control, adjustment of the mA and/or kV according to patient size and/or use of iterative reconstruction technique. COMPARISON:  None Available. FINDINGS: Bones/Joint/Cartilage No evidence of acute fracture or dislocation. Chronic healed fracture of the left superior and inferior pubic rami. Heterotopic ossification about the anteromedial aspect of the proximal femur. Sacroiliac joints and pubic symphysis are intact. Osteopenia. Ligaments Suboptimally assessed by CT. Muscles and Tendons Muscles are normal in bulk. No intramuscular hematoma or fluid collection. Soft tissues Mild subcutaneous soft tissue edema. No fluid collection or hematoma. Partially imaged abdominal viscera are unremarkable. IMPRESSION: 1. No evidence of acute fracture or dislocation. 2. Chronic healed fracture of the left superior and inferior pubic rami. 3. Mild subcutaneous soft tissue edema  without fluid collection or hematoma. Electronically Signed   By: Keane Police D.O.   On: 10/20/2022 22:22   CT Cervical Spine Wo Contrast  Result Date: 10/20/2022 CLINICAL DATA:  Status post fall. EXAM: CT CERVICAL SPINE WITHOUT CONTRAST TECHNIQUE: Multidetector CT imaging of the cervical spine was performed without intravenous contrast. Multiplanar CT image reconstructions were also generated. RADIATION DOSE REDUCTION: This exam was performed according to the departmental dose-optimization program which includes automated exposure control, adjustment of the mA and/or kV according to patient size and/or use of iterative reconstruction technique. COMPARISON:  None Available. FINDINGS: Alignment: There is approximately 2.5 mm retrolisthesis of the C5 vertebral body on C6. Skull base and vertebrae: No acute fracture. Degenerative changes are seen along the dorsal aspect of the anterior arch of C1. Soft tissues and spinal canal: No prevertebral fluid or swelling. No visible canal hematoma. Disc levels: Marked severity endplate sclerosis, mild anterior osteophyte formation and mild to moderate severity posterior bony spurring are seen at the levels of C5-C6 and C6-C7. There is marked severity narrowing of the anterior atlantoaxial articulation. Marked severity intervertebral disc space narrowing is seen at C5-C6 and C6-C7. Bilateral marked severity multilevel facet joint hypertrophy is noted. Upper chest: Bilateral pleural effusions are noted. Other: None. IMPRESSION: 1. No acute fracture within the cervical spine. 2. Marked severity degenerative changes at the levels of  C5-C6 and C6-C7. 3. Bilateral pleural effusions. Electronically Signed   By: Aram Candela M.D.   On: 10/20/2022 22:21      PROCEDURES:  Critical Care performed: {CriticalCareYesNo:19197::"Yes, see critical care procedure note(s)","No"}  Procedures   MEDICATIONS ORDERED IN ED: Medications  sodium chloride 0.9 % bolus 500 mL (500  mLs Intravenous New Bag/Given 10/20/22 2155)  morphine (PF) 4 MG/ML injection 4 mg (4 mg Intravenous Given 10/20/22 2156)  ondansetron (ZOFRAN) injection 4 mg (4 mg Intravenous Given 10/20/22 2156)     IMPRESSION / MDM / ASSESSMENT AND PLAN / ED COURSE  I reviewed the triage vital signs and the nursing notes.                              Differential diagnosis includes, but is not limited to, ***  Patient's presentation is most consistent with {EM COPA:27473}  {If the patient is on the monitor, remove the brackets and asterisks on the sentence below and remember to document it as a Procedure as well. Otherwise delete the sentence below:1} {**The patient is on the cardiac monitor to evaluate for evidence of arrhythmia and/or significant heart rate changes.**} {Remember to include, when applicable, any/all of the following data: independent review of imaging independent review of labs (comment specifically on pertinent positives and negatives) review of specific prior hospitalizations, PCP/specialist notes, etc. discuss meds given and prescribed document any discussion with consultants (including hospitalists) any clinical decision tools you used and why (PECARN, NEXUS, etc.) did you consider admitting the patient? document social determinants of health affecting patient's care (homelessness, inability to follow up in a timely fashion, etc) document any pre-existing conditions increasing risk on current visit (e.g. diabetes and HTN increasing danger of high-risk chest pain/ACS) describes what meds you gave (especially parenteral) and why any other interventions?:1}     FINAL CLINICAL IMPRESSION(S) / ED DIAGNOSES   Final diagnoses:  None     Rx / DC Orders   ED Discharge Orders     None        Note:  This document was prepared using Dragon voice recognition software and may include unintentional dictation errors.

## 2022-10-20 NOTE — ED Provider Notes (Signed)
[facility] Provider Note    Event Date/Time   First MD Initiated Contact with Patient 10/20/22 2105     (approximate)   History   Leg Swelling   HPI  Linda Hess is a 88 y.o. female with a history of chronic demyelinating illness, previous orthopedic injuries and a recent diagnosis of a "hip fracture" from a couple months ago   Review of outside notes including orthopedic note from October the notes that the patient was told at [facility] that she had a hip fracture that would take 6 to 8 weeks to heal.  She followed up with orthopedics recommended she have a CT scan done in [place]  Today she was at home, and she and her son who are with her reports that she was getting up from bed when she started slid out and landed on her buttock  Physical Exam   Triage Vital Signs: ED Triage Vitals  Enc Vitals Group     BP 10/20/22 2003 126/71     Pulse Rate 10/20/22 2003 76     Resp 10/20/22 2003 18     Temp 10/20/22 2003 98.7 F (37.1 C)     Temp Source 10/20/22 2003 Oral     SpO2 10/20/22 2003 96 %     Weight 10/20/22 2008 149 lb (67.6 kg)     Height --      Head Circumference --      Peak Flow --      Pain Score 10/20/22 2004 0     Pain Loc --      Pain Edu? --      Excl. in [place]? --     Most recent vital signs: Vitals:   10/20/22 2115 10/20/22 2245  BP:    Pulse: 79 72  Resp:    Temp:    SpO2: 94% 97%     General: Awake, and screaming in pain, reports severe pain along the left hip and left lateral pelvic region. CV:  Good peripheral perfusion.  Normal heart tones Resp:  Normal effort.  Clear bilaterally with normal excursion.  No accessory muscle use.  No hypoxia Abd:  No distention.  Soft nontender nondistended throughout abdomen but notably tender along palpation of the left lower pelvic bony prominence, and also along the left upper hip. Other:  No deformities or bruising noted of the lower extremities  bilateral.  She does report tenderness to palpation of the left proximal hip without obvious shortening or rotation.  Palpable dorsalis pedis pulses in lower extremities, with moderate bilateral pitting edema and venous stasis changes without evidence of erythema or cellulitic change.  Patient and her son report skin changes and swelling in the lower legs is somewhat chronic in nature  Moves upper extremities without difficulty.  No midline cervical tenderness but does report some tenderness to the left paraspinous region.  Normocephalic atraumatic.  Fully alert and oriented.   ED Results / Procedures / Treatments   Labs (all labs ordered are listed, but only abnormal results are displayed) Labs Reviewed  BASIC METABOLIC PANEL - Abnormal; Notable for the following components:      Result Value   Glucose, Bld 306 (*)    BUN 35 (*)    Creatinine, Ser 1.58 (*)    GFR, Estimated 31 (*)    All other components within normal limits  CBC - Abnormal; Notable for the following components:   WBC 13.0 (*)  All other components within normal limits  BRAIN NATRIURETIC PEPTIDE - Abnormal; Notable for the following components:   B Natriuretic Peptide 657.5 (*)    All other components within normal limits     RADIOLOGY  DG Femur Min 2 Views Left  Result Date: 10/20/2022 CLINICAL DATA:  fall, pain EXAM: LEFT FEMUR 2 VIEWS COMPARISON:  CT left hip 10/20/2022, x-ray right hip 10/20/2022. FINDINGS: Age-indeterminate comminuted left intertrochanteric fracture. No acute displaced fracture more distally. The knee is grossly unremarkable other than at least mild degenerative changes. No left hip dislocation. Partially visualized old healed left superior and inferior pubic rami fractures. Soft tissues are unremarkable. IMPRESSION: 1. No acute displaced fracture or dislocation. 2. Radiographically age-indeterminate comminuted left intertrochanteric fracture. Findings appears chronic on CT left hip 10/20/2022.  Electronically Signed   By: Iven Finn M.D.   On: 10/20/2022 22:45   DG Chest 1 View  Result Date: 10/20/2022 CLINICAL DATA:  Bilateral leg swelling and recent fall, initial encounter EXAM: CHEST  1 VIEW COMPARISON:  04/02/2019 FINDINGS: Cardiac shadow is enlarged but stable. Aortic calcifications are seen. The lungs are well aerated bilaterally. No bony abnormality is noted. IMPRESSION: No acute abnormality is noted. Electronically Signed   By: Inez Catalina M.D.   On: 10/20/2022 22:40   DG Femur Min 2 Views Right  Result Date: 10/20/2022 CLINICAL DATA:  Recent fall with right leg pain and swelling, initial encounter EXAM: RIGHT FEMUR 2 VIEWS COMPARISON:  None Available. FINDINGS: Right knee prosthesis is noted. Significant dystrophic calcification is noted along the infrapatellar ligament. No acute fracture or dislocation is noted. IMPRESSION: Chronic changes without acute abnormality. Electronically Signed   By: Inez Catalina M.D.   On: 10/20/2022 22:39   CT ABDOMEN PELVIS WO CONTRAST  Result Date: 10/20/2022 CLINICAL DATA:  Left-sided abdominal pain after fall, bilateral lower extremity edema EXAM: CT ABDOMEN AND PELVIS WITHOUT CONTRAST TECHNIQUE: Multidetector CT imaging of the abdomen and pelvis was performed following the standard protocol without IV contrast. Unenhanced CT was performed per clinician order. Lack of IV contrast limits sensitivity and specificity, especially for evaluation of abdominal/pelvic solid viscera. RADIATION DOSE REDUCTION: This exam was performed according to the departmental dose-optimization program which includes automated exposure control, adjustment of the mA and/or kV according to patient size and/or use of iterative reconstruction technique. COMPARISON:  10/09/2022 FINDINGS: Lower chest: No acute pleural or parenchymal lung disease. Hepatobiliary: Cholecystectomy. Unremarkable unenhanced appearance of the liver. Pancreas: Unremarkable unenhanced appearance.  Spleen: Unremarkable unenhanced appearance. Adrenals/Urinary Tract: No urinary tract calculi or obstructive uropathy. The adrenals and bladder are unremarkable. Stomach/Bowel: No bowel obstruction or ileus. Normal appendix right lower quadrant. No bowel wall thickening or inflammatory change. Small hiatal hernia. Vascular/Lymphatic: Aortic atherosclerosis. No enlarged abdominal or pelvic lymph nodes. Reproductive: Uterus and bilateral adnexa are unremarkable. Other: No free fluid or free intraperitoneal gas. Fat containing umbilical hernia. Musculoskeletal: Mild age indeterminate compression deformity involving the superior endplate of the L1 vertebral body, with approximately 10% loss of vertebral body height. This is likely chronic given lack of visualized fracture line or paraspinal edema. No acute bony abnormalities. Chronic healed left superior and inferior pubic rami are again identified. Stable bilateral hip osteoarthritis, with continued heterotopic ossification surrounding the left hip. Reconstructed images demonstrate no additional findings. IMPRESSION: 1. No acute intra-abdominal or intrapelvic process. 2. Age indeterminate compression deformity superior endplate L1, likely chronic. 3. Prior healed left superior and inferior pubic rami fractures unchanged. Stable bilateral hip osteoarthritis. 4. Fat  containing umbilical hernia and small hiatal hernia as above. 5.  Aortic Atherosclerosis (ICD10-I70.0). Electronically Signed   By: Randa Ngo M.D.   On: 10/20/2022 22:25   CT Head Wo Contrast  Result Date: 10/20/2022 CLINICAL DATA:  Status post fall. EXAM: CT HEAD WITHOUT CONTRAST TECHNIQUE: Contiguous axial images were obtained from the base of the skull through the vertex without intravenous contrast. RADIATION DOSE REDUCTION: This exam was performed according to the departmental dose-optimization program which includes automated exposure control, adjustment of the mA and/or kV according to patient  size and/or use of iterative reconstruction technique. COMPARISON:  July 11, 2008 FINDINGS: Brain: There is mild cerebral atrophy with widening of the extra-axial spaces and ventricular dilatation. There are areas of decreased attenuation within the white matter tracts of the supratentorial brain, consistent with microvascular disease changes. Vascular: There is mild to marked severity calcification of the bilateral cavernous carotid arteries. Skull: Normal. Negative for fracture or focal lesion. Sinuses/Orbits: No acute finding. Other: None. IMPRESSION: 1. No acute intracranial abnormality. 2. Generalized cerebral atrophy and microvascular disease changes of the supratentorial brain. Electronically Signed   By: Virgina Norfolk M.D.   On: 10/20/2022 22:22   CT Hip Left Wo Contrast  Result Date: 10/20/2022 CLINICAL DATA:  Fall leg swelling. EXAM: CT OF THE LEFT HIP WITHOUT CONTRAST TECHNIQUE: Multidetector CT imaging of the left hip was performed according to the standard protocol. Multiplanar CT image reconstructions were also generated. RADIATION DOSE REDUCTION: This exam was performed according to the departmental dose-optimization program which includes automated exposure control, adjustment of the mA and/or kV according to patient size and/or use of iterative reconstruction technique. COMPARISON:  None Available. FINDINGS: Bones/Joint/Cartilage No evidence of acute fracture or dislocation. Chronic healed fracture of the left superior and inferior pubic rami. Heterotopic ossification about the anteromedial aspect of the proximal femur. Sacroiliac joints and pubic symphysis are intact. Osteopenia. Ligaments Suboptimally assessed by CT. Muscles and Tendons Muscles are normal in bulk. No intramuscular hematoma or fluid collection. Soft tissues Mild subcutaneous soft tissue edema. No fluid collection or hematoma. Partially imaged abdominal viscera are unremarkable. IMPRESSION: 1. No evidence of acute fracture  or dislocation. 2. Chronic healed fracture of the left superior and inferior pubic rami. 3. Mild subcutaneous soft tissue edema without fluid collection or hematoma. Electronically Signed   By: Keane Police D.O.   On: 10/20/2022 22:22   CT Cervical Spine Wo Contrast  Result Date: 10/20/2022 CLINICAL DATA:  Status post fall. EXAM: CT CERVICAL SPINE WITHOUT CONTRAST TECHNIQUE: Multidetector CT imaging of the cervical spine was performed without intravenous contrast. Multiplanar CT image reconstructions were also generated. RADIATION DOSE REDUCTION: This exam was performed according to the departmental dose-optimization program which includes automated exposure control, adjustment of the mA and/or kV according to patient size and/or use of iterative reconstruction technique. COMPARISON:  None Available. FINDINGS: Alignment: There is approximately 2.5 mm retrolisthesis of the C5 vertebral body on C6. Skull base and vertebrae: No acute fracture. Degenerative changes are seen along the dorsal aspect of the anterior arch of C1. Soft tissues and spinal canal: No prevertebral fluid or swelling. No visible canal hematoma. Disc levels: Marked severity endplate sclerosis, mild anterior osteophyte formation and mild to moderate severity posterior bony spurring are seen at the levels of C5-C6 and C6-C7. There is marked severity narrowing of the anterior atlantoaxial articulation. Marked severity intervertebral disc space narrowing is seen at C5-C6 and C6-C7. Bilateral marked severity multilevel facet joint hypertrophy is noted. Upper  chest: Bilateral pleural effusions are noted. Other: None. IMPRESSION: 1. No acute fracture within the cervical spine. 2. Marked severity degenerative changes at the levels of C5-C6 and C6-C7. 3. Bilateral pleural effusions. Electronically Signed   By: Virgina Norfolk M.D.   On: 10/20/2022 22:21      PROCEDURES:  Critical Care performed: No  Procedures   MEDICATIONS ORDERED IN  ED: Medications  ondansetron (ZOFRAN) 4 MG/2ML injection (has no administration in time range)  morphine (PF) 4 MG/ML injection (has no administration in time range)  sodium chloride 0.9 % bolus 500 mL (500 mLs Intravenous New Bag/Given 10/20/22 2155)  morphine (PF) 4 MG/ML injection 4 mg (4 mg Intravenous Given 10/20/22 2156)  ondansetron (ZOFRAN) injection 4 mg (4 mg Intravenous Given 10/20/22 2156)     IMPRESSION / MDM / ASSESSMENT AND PLAN / ED COURSE  I reviewed the triage vital signs and the nursing notes.                              Differential diagnosis includes, but is not limited to, traumatic injuries from fall, nondisplaced left hip fracture, hematoma, bruising or other complication related to injury.  She does use anticoagulant and given her history of fall and injury CT imaging of the brain also obtained to rule out intracranial hemorrhage.  She denies acute precipitating for symptoms of illness such as chest pain shortness of breath fever or recent illness aside from being diagnosed with a possible hip fracture oral but over a month ago and follow-up with orthopedics a couple weeks ago  Patient's presentation is most consistent with acute complicated illness / injury requiring diagnostic workup.  The patient is on the cardiac monitor to evaluate for evidence of arrhythmia and/or significant heart rate changes.  Pain improved after morphine.  Continues to have pain to palpation and movement along the left upper thigh hip region.  Based upon this and the clinical history of a previously suspected left hip fracture, degree of pain for the patient's age, need for IV opioid for pain control, discussed case with Dr. Jonelle Sidle as well as recommendation the patient should have MRI of the left hip to exclude occult fracture.  Patient and her son both understanding very agreeable with plan for admission, pain control, in anticipation of further work-up regarding pain in the left  hip/pelvic region.       FINAL CLINICAL IMPRESSION(S) / ED DIAGNOSES   Final diagnoses:  Left hip pain  Left flank pain  Ambulatory dysfunction     Rx / DC Orders   ED Discharge Orders     None        Note:  This document was prepared using Dragon voice recognition software and may include unintentional dictation errors.   Delman Kitten, MD 10/21/22 [phone]

## 2022-10-20 NOTE — ED Notes (Signed)
Patient transported to CT 

## 2022-10-20 NOTE — H&P (Signed)
History and Physical    Patient: Linda Hess LNL:[id number] DOB: Mar 30, 1931 DOA: 10/20/2022 DOS: the patient was seen and examined on 10/20/2022 PCP: Vidal Schwalbe, MD  Patient coming from: Home  Chief Complaint:  Chief Complaint  Patient presents with   Leg Swelling   HPI: Linda Hess is a 88 y.o. female with medical history significant of type 2 diabetes, CIDP, essential hypertension, history of DVT of the left leg, mixed hyperlipidemia who presented to the ER after sustaining a fall today.  Patient has been having chronic left-sided hip pain and has not walked for weeks.  Per patient so she has been getting up moving apparently between furniture.  Was seen by orthopedics recently and told she has chronic left femoral neck fracture.  She was given pain medication but pain has persisted.  Patient was seen by Dr. Arther Abbott last week.  In the ER today she was having pain more than 10 out of 10.  No obvious fracture on CT and x-ray.  Symptoms however indicates possible new fracture.  Also swelling to bilateral legs but patient has history of chronic lymphedema.  At this point she is being admitted for pain management and reevaluation for possible new fracture.  Patient also has lumbar compression fracture especially over the L1.  Review of Systems: As mentioned in the history of present illness. All other systems reviewed and are negative. Past Medical History:  Diagnosis Date   CIDP (chronic inflammatory demyelinating polyneuropathy) (HCC)    Essential hypertension, benign    pt denies   History of DVT (deep vein thrombosis)    Reportedly left leg, details not clear   Mixed hyperlipidemia    Type 2 diabetes mellitus ([place])    Past Surgical History:  Procedure Laterality Date   CHOLECYSTECTOMY     Left quadriceps muscle biopsy  March 2010   Right knee replacement  September 2014   Social History:  reports that she has never smoked. She has never used smokeless  tobacco. She reports that she does not drink alcohol and does not use drugs.  Allergies  Allergen Reactions   Amoxicillin-Pot Clavulanate Hives   Other Hives and Itching    RED MEAT   Penicillins     hives Other reaction(s): rash    Family History  Problem Relation Age of Onset   Heart attack Father    Stroke Mother    Cancer Brother     Prior to Admission medications   Medication Sig Start Date End Date Taking? Authorizing Provider  calcium-vitamin D (OSCAL WITH D) 500-200 MG-UNIT per tablet Take 1 tablet by mouth at bedtime.  03/14/14  Yes Satira Sark, MD  gabapentin (NEURONTIN) 100 MG capsule Take 100 mg by mouth 3 (three) times daily.  08/30/18  Yes [provider]  HYDROcodone-acetaminophen (NORCO/VICODIN) 5-325 MG tablet Take 1 tablet by mouth every 8 (eight) hours as needed.   Yes [provider]  JANUVIA 100 MG tablet Take 100 mg by mouth daily. 03/21/20  Yes [provider]  metFORMIN (GLUCOPHAGE-XR) 500 MG 24 hr tablet Take 1 tablet (500 mg total) by mouth daily with breakfast. 07/25/21  Yes Emokpae, Courage, MD  Omega-3 Fatty Acids (FISH OIL) 1200 MG CAPS Take 1 capsule by mouth every morning.    Yes [provider]  oxybutynin (DITROPAN) 5 MG tablet Take 0.5 tablets by mouth 2 (two) times daily.   Yes [provider]  predniSONE (DELTASONE) 10 MG tablet Take 1 tablet (  10 mg total) by mouth daily with breakfast. 07/25/21  Yes Emokpae, Courage, MD  Rivaroxaban (XARELTO) 15 MG TABS tablet Take 1 tablet (15 mg total) by mouth daily with supper. 07/29/21  Yes Emokpae, Courage, MD  rosuvastatin (CRESTOR) 10 MG tablet Take 10 mg by mouth daily. 03/30/19  Yes [provider]  vitamin C (ASCORBIC ACID) 500 MG tablet Take 500 mg by mouth daily.   Yes [provider]  acetaminophen (TYLENOL) 325 MG tablet Take 325 mg by mouth every 6 (six) hours as needed for mild pain.    [provider]    Physical  Exam: Vitals:   10/20/22 2003 10/20/22 2008 10/20/22 2115 10/20/22 2245  BP: 126/71     Pulse: 76  79 72  Resp: 18     Temp: 98.7 F (37.1 C)     TempSrc: Oral     SpO2: 96%  94% 97%  Weight:  67.6 kg     Constitutional: Acutely ill looking, in obvious distress NAD, calm, comfortable Eyes: PERRL, lids and conjunctivae normal ENMT: Mucous membranes are moist. Posterior pharynx clear of any exudate or lesions.Normal dentition.  Neck: normal, supple, no masses, no thyromegaly Respiratory: clear to auscultation bilaterally, no wheezing, no crackles. Normal respiratory effort. No accessory muscle use.  Cardiovascular: Regular rate and rhythm, no murmurs / rubs / gallops. No extremity edema. 2+ pedal pulses. No carotid bruits.  Abdomen: no tenderness, no masses palpated. No hepatosplenomegaly. Bowel sounds positive.  Musculoskeletal: Decreased range of motion on the left hip area.  Tender, Skin: no rashes, lesions, ulcers. No induration Neurologic: CN 2-12 grossly intact. Sensation intact, DTR normal. Strength 5/5 in all 4.  Psychiatric: Normal judgment and insight. Alert and oriented x 3. Normal mood  Data Reviewed:  Glucose 306 BUN 35, creatinine 1.58, BNP 657 and white count 13.  CT abdomen pelvis showed no acute intra-abdominal or intrapelvic process, age indeterminate compression deformity superior endplate L1, prior healed left superior and inferior pubic rami fractures.  Marked severity degenerative change at the level of C5-6 and C6-C7 and bilateral pleural effusions no evidence of acute fracture or dislocation chronic healed fracture of the left superior and inferior pubic rami chest x-ray showed no acute findings x-ray of the femur showed no acute displaced fracture.  There is an age indeterminate comminuted left intertrochanter fracture which appears to be chronic.  Assessment and Plan:   #1 status post fall: Patient has uncontrollable pain after the fall.  Probably exacerbation  of chronic fracture versus new fracture.  May require MRI of the left hip to further evaluate.  Admitted and started IV pain medications.  Continue monitoring.  PT OT consult when stable  #2 diabetes: Sliding scale insulin with supportive care.  #3 chronic left femoral neck fracture: Continue pain management and supportive care  #4 history of CIDP: Chronic.  Continue to monitor  #5 peripheral vascular disease: Stable.  Continue chronic home regimen  #6 history of spinal stenosis of the lumbar region: Continue pain management and PT OT  #7 essential hypertension: Comfortable resume home regimen   Advance Care Planning:   Code Status: Prior full code  Consults: None  Family Communication: Son  Severity of Illness: The appropriate patient status for this patient is INPATIENT. Inpatient status is judged to be reasonable and necessary in order to provide the required intensity of service to ensure the patient's safety. The patient's presenting symptoms, physical exam findings, and initial radiographic and laboratory data in the context  of their chronic comorbidities is felt to place them at high risk for further clinical deterioration. Furthermore, it is not anticipated that the patient will be medically stable for discharge from the hospital within 2 midnights of admission.   * I certify that at the point of admission it is my clinical judgment that the patient will require inpatient hospital care spanning beyond 2 midnights from the point of admission due to high intensity of service, high risk for further deterioration and high frequency of surveillance required.*  AuthorBarbette Merino, MD 10/20/2022 11:28 PM  For on call review [email or web address].

## 2022-10-20 NOTE — ED Notes (Signed)
Patient transported to CT and Xray. Pt given medications before transport

## 2022-10-20 NOTE — ED Triage Notes (Addendum)
Pt comes from home via [facility] complaining of bilateral leg swelling. Pt had a fall earlier today but did not get taken to hospital. Family noticed that after fall her legs started swelling more and getting more purple. Pt not complaining of any pain. Denies CP, SOB

## 2022-10-20 NOTE — ED Notes (Signed)
Pt is screaming when BP is attempted. Pt has large bruises on upper arms

## 2022-10-20 NOTE — ED Notes (Signed)
Pt has swollen lower legs that are weeping. Pt fell earlier today and says she has not been able to walk for weeks. Son says she has just been getting up and moving between furniture. Pt's son said that pt has been to dr and ortho. Dr said there is a fx in the hip, ortho says there is not a fx. Pt was given pain meds by ortho and son said they were not satisfied with pain meds only

## 2022-10-21 ENCOUNTER — Encounter: Payer: Self-pay | Admitting: Internal Medicine

## 2022-10-21 DIAGNOSIS — G6181 Chronic inflammatory demyelinating polyneuritis: Secondary | ICD-10-CM | POA: Diagnosis not present

## 2022-10-21 DIAGNOSIS — I739 Peripheral vascular disease, unspecified: Secondary | ICD-10-CM | POA: Diagnosis not present

## 2022-10-21 DIAGNOSIS — I10 Essential (primary) hypertension: Secondary | ICD-10-CM | POA: Diagnosis not present

## 2022-10-21 DIAGNOSIS — I5033 Acute on chronic diastolic (congestive) heart failure: Secondary | ICD-10-CM | POA: Diagnosis not present

## 2022-10-21 LAB — BASIC METABOLIC PANEL
Anion gap: 6 (ref 5–15)
BUN: 35 mg/dL — ABNORMAL HIGH (ref 8–23)
CO2: 28 mmol/L (ref 22–32)
Calcium: 8.9 mg/dL (ref 8.9–10.3)
Chloride: 105 mmol/L (ref 98–111)
Creatinine, Ser: 1.41 mg/dL — ABNORMAL HIGH (ref 0.44–1.00)
GFR, Estimated: 36 mL/min — ABNORMAL LOW (ref >60)
Glucose, Bld: 226 mg/dL — ABNORMAL HIGH (ref 70–99)
Potassium: 4 mmol/L (ref 3.5–5.1)
Sodium: 139 mmol/L (ref 135–145)

## 2022-10-21 LAB — CBC
HCT: 38.2 % (ref 36.0–46.0)
Hemoglobin: 11.9 g/dL — ABNORMAL LOW (ref 12.0–15.0)
MCH: 28.6 pg (ref 26.0–34.0)
MCHC: 31.2 g/dL (ref 30.0–36.0)
MCV: 91.8 fL (ref 80.0–100.0)
Platelets: 144 10*3/uL — ABNORMAL LOW (ref 150–400)
RBC: 4.16 MIL/uL (ref 3.87–5.11)
RDW: 13.5 % (ref 11.5–15.5)
WBC: 11 10*3/uL — ABNORMAL HIGH (ref 4.0–10.5)
nRBC: 0 % (ref 0.0–0.2)

## 2022-10-21 LAB — GLUCOSE, CAPILLARY
Glucose-Capillary: 223 mg/dL — ABNORMAL HIGH (ref 70–99)
Glucose-Capillary: 193 mg/dL — ABNORMAL HIGH (ref 70–99)
Glucose-Capillary: 168 mg/dL — ABNORMAL HIGH (ref 70–99)
Glucose-Capillary: 173 mg/dL — ABNORMAL HIGH (ref 70–99)
Glucose-Capillary: 201 mg/dL — ABNORMAL HIGH (ref 70–99)

## 2022-10-21 LAB — HEMOGLOBIN A1C
Hgb A1c MFr Bld: 10.5 % — ABNORMAL HIGH (ref 4.8–5.6)
Mean Plasma Glucose: 254.65 mg/dL

## 2022-10-21 MED ORDER — LACTATED RINGERS IV SOLN: INTRAVENOUS | Status: DC

## 2022-10-21 MED ORDER — ONDANSETRON HCL 4 MG PO TABS: 4.0000 mg | ORAL_TABLET | Freq: Four times a day (QID) | ORAL | Status: DC | PRN

## 2022-10-21 MED ORDER — ONDANSETRON HCL 4 MG/2ML IJ SOLN: 4.0000 mg | Freq: Four times a day (QID) | INTRAMUSCULAR | Status: DC | PRN

## 2022-10-21 MED ORDER — METHOCARBAMOL 1000 MG/10ML IJ SOLN: 500.0000 mg | Freq: Four times a day (QID) | INTRAVENOUS | Status: DC | PRN

## 2022-10-21 MED ORDER — INSULIN ASPART 100 UNIT/ML IJ SOLN
0.0000 [IU] | Freq: Three times a day (TID) | INTRAMUSCULAR | Status: DC
  Administered 2022-10-21 – 2022-10-22 (×4): 3 [IU] via SUBCUTANEOUS
  Administered 2022-10-22 – 2022-10-23 (×2): 8 [IU] via SUBCUTANEOUS
  Administered 2022-10-23: 5 [IU] via SUBCUTANEOUS
  Administered 2022-10-23 – 2022-10-24 (×2): 3 [IU] via SUBCUTANEOUS
  Administered 2022-10-24: 8 [IU] via SUBCUTANEOUS
  Administered 2022-10-24: 11 [IU] via SUBCUTANEOUS
  Administered 2022-10-25: 2 [IU] via SUBCUTANEOUS
  Administered 2022-10-25: 8 [IU] via SUBCUTANEOUS
  Administered 2022-10-25: 5 [IU] via SUBCUTANEOUS
  Filled 2022-10-21 (×14): qty 1

## 2022-10-21 MED ORDER — GABAPENTIN 100 MG PO CAPS
100.0000 mg | ORAL_CAPSULE | Freq: Three times a day (TID) | ORAL | Status: DC
  Administered 2022-10-21 – 2022-10-25 (×13): 100 mg via ORAL
  Filled 2022-10-21 (×13): qty 1

## 2022-10-21 MED ORDER — VITAMIN C 500 MG PO TABS
500.0000 mg | ORAL_TABLET | Freq: Every day | ORAL | Status: DC
  Administered 2022-10-21 – 2022-10-26 (×6): 500 mg via ORAL
  Filled 2022-10-21 (×6): qty 1

## 2022-10-21 MED ORDER — OXYBUTYNIN CHLORIDE 5 MG PO TABS
2.5000 mg | ORAL_TABLET | Freq: Two times a day (BID) | ORAL | Status: DC
  Administered 2022-10-21 – 2022-10-26 (×11): 2.5 mg via ORAL
  Filled 2022-10-21 (×11): qty 1

## 2022-10-21 MED ORDER — ROSUVASTATIN CALCIUM 10 MG PO TABS
10.0000 mg | ORAL_TABLET | Freq: Every day | ORAL | Status: DC
  Administered 2022-10-21 – 2022-10-26 (×6): 10 mg via ORAL
  Filled 2022-10-21 (×6): qty 1

## 2022-10-21 MED ORDER — APIXABAN 2.5 MG PO TABS
2.5000 mg | ORAL_TABLET | Freq: Two times a day (BID) | ORAL | Status: DC
  Administered 2022-10-21 – 2022-10-25 (×8): 2.5 mg via ORAL
  Filled 2022-10-21 (×8): qty 1

## 2022-10-21 MED ORDER — RIVAROXABAN 15 MG PO TABS
15.0000 mg | ORAL_TABLET | Freq: Every day | ORAL | Status: DC
  Filled 2022-10-21: qty 1

## 2022-10-21 MED ORDER — FUROSEMIDE 10 MG/ML IJ SOLN
20.0000 mg | Freq: Two times a day (BID) | INTRAMUSCULAR | Status: DC
  Administered 2022-10-22 – 2022-10-23 (×3): 20 mg via INTRAVENOUS
  Filled 2022-10-21 (×3): qty 4

## 2022-10-21 MED ORDER — PREDNISONE 10 MG PO TABS
10.0000 mg | ORAL_TABLET | Freq: Every day | ORAL | Status: DC
  Administered 2022-10-21 – 2022-10-26 (×6): 10 mg via ORAL
  Filled 2022-10-21 (×6): qty 1

## 2022-10-21 MED ORDER — OMEGA-3-ACID ETHYL ESTERS 1 G PO CAPS
1.0000 | ORAL_CAPSULE | Freq: Every morning | ORAL | Status: DC
  Administered 2022-10-21 – 2022-10-26 (×6): 1 g via ORAL
  Filled 2022-10-21 (×6): qty 1

## 2022-10-21 MED ORDER — MORPHINE SULFATE (PF) 4 MG/ML IV SOLN
4.0000 mg | INTRAVENOUS | Status: DC | PRN
  Administered 2022-10-21 – 2022-10-22 (×3): 4 mg via INTRAVENOUS
  Filled 2022-10-21 (×3): qty 1

## 2022-10-21 MED ORDER — ACETAMINOPHEN 325 MG PO TABS
325.0000 mg | ORAL_TABLET | Freq: Four times a day (QID) | ORAL | Status: DC | PRN
  Administered 2022-10-23: 325 mg via ORAL
  Filled 2022-10-21: qty 1

## 2022-10-21 MED ORDER — OYSTER SHELL CALCIUM/D3 500-5 MG-MCG PO TABS
1.0000 | ORAL_TABLET | Freq: Every day | ORAL | Status: DC
  Administered 2022-10-21 – 2022-10-25 (×5): 1 via ORAL
  Filled 2022-10-21 (×5): qty 1

## 2022-10-21 MED ORDER — OXYCODONE HCL 5 MG PO TABS
5.0000 mg | ORAL_TABLET | ORAL | Status: DC | PRN
  Administered 2022-10-21 – 2022-10-24 (×4): 5 mg via ORAL
  Filled 2022-10-21 (×5): qty 1

## 2022-10-21 MED ORDER — INSULIN ASPART 100 UNIT/ML IJ SOLN
0.0000 [IU] | Freq: Every day | INTRAMUSCULAR | Status: DC
  Administered 2022-10-21 – 2022-10-25 (×5): 2 [IU] via SUBCUTANEOUS
  Filled 2022-10-21 (×5): qty 1

## 2022-10-21 MED ORDER — LINAGLIPTIN 5 MG PO TABS
5.0000 mg | ORAL_TABLET | Freq: Every day | ORAL | Status: DC
  Administered 2022-10-21 – 2022-10-24 (×4): 5 mg via ORAL
  Filled 2022-10-21 (×6): qty 1

## 2022-10-21 NOTE — Evaluation (Signed)
Occupational Therapy Evaluation Patient Details Name: Linda Hess MRN: [id number] DOB: [birth year] Today's Date: 10/21/2022   History of Present Illness Linda Hess is a 88 y.o. female with medical history significant of type 2 diabetes, CIDP, essential hypertension, history of DVT of the left leg, mixed hyperlipidemia who presented to the ER after sustaining a fall today.  Patient has been having chronic left-sided hip pain and has not walked for weeks.  Per patient so she has been getting up moving apparently between furniture.  Was seen by orthopedics recently and told she has chronic left femoral neck fracture.  She was given pain medication but pain has persisted.  Patient was seen by Dr. Arther Abbott last week.  In the ER today she was having pain more than 10 out of 10.  No obvious fracture on CT and x-ray.  Symptoms however indicates possible new fracture.  Also swelling to bilateral legs but patient has history of chronic lymphedema.  At this point she is being admitted for pain management and reevaluation for possible new fracture.  Patient also has lumbar compression fracture especially over the L1.   Clinical Impression   Patient presenting with decreased independence in self-care, functional mobility, balance, safety, and strength/endurance. Patient asleep upon arrival, once awaken, patient is very lethargic, crying with c/o pain. Patient's son in room, providing PLOF. Reporting patient uses a WC to propel around the home and has a BSC that she uses daily, RW, and WC. Patient also has a PCA who comes in 2x/week to assist with bathing, dressing, meals, etc. Patient currently functioning at Max A +2 for bed mobility to sit up in bed to eat. Patient required total A for eating/feeding due to patient being very lethargic. Total A anticipated for Lb dressing tasks. Patient left in bed with son present in room, call bell in reach, bed alarm set, and all needs met. Patient will benefit  from acute OT to increase overall independence in the areas of ADLs, functional mobility, in order to safely discharge to the next venue of care.       Recommendations for follow up therapy are one component of a multi-disciplinary discharge planning process, led by the attending physician.  Recommendations may be updated based on patient status, additional functional criteria and insurance authorization.   Follow Up Recommendations  Skilled nursing-short term rehab (<3 hours/day)    Assistance Recommended at Discharge Frequent or constant Supervision/Assistance  Patient can return home with the following Two people to help with walking and/or transfers;A lot of help with bathing/dressing/bathroom;Help with stairs or ramp for entrance;Direct supervision/assist for financial management;Assistance with cooking/housework;Assist for transportation;Direct supervision/assist for medications management    Functional Status Assessment  Patient has had a recent decline in their functional status and/or demonstrates limited ability to make significant improvements in function in a reasonable and predictable amount of time  Equipment Recommendations  Other (comment) (Defer to next venue of care.)       Precautions / Restrictions Precautions Precautions: Fall Restrictions Weight Bearing Restrictions: No      Mobility Bed Mobility Overal bed mobility: Needs Assistance             General bed mobility comments: +2 assist assist to sit up and eat in bed.    Transfers                              ADL either performed or assessed with clinical  judgement   ADL Overall ADL's : Needs assistance/impaired Eating/Feeding: Total assistance Eating/Feeding Details (indicate cue type and reason): Patient very lethargic                 Lower Body Dressing: Total assistance Lower Body Dressing Details (indicate cue type and reason): anticipate total A for LB dressing tasks.                      Vision Patient Visual Report: No change from baseline              Pertinent Vitals/Pain Pain Assessment Pain Assessment: Faces Faces Pain Scale: Hurts whole lot Pain Location: Left hip and legs Pain Descriptors / Indicators: Discomfort, Sore, Crying Pain Intervention(s): Limited activity within patient's tolerance, Monitored during session, Repositioned     Hand Dominance     Extremity/Trunk Assessment Upper Extremity Assessment Upper Extremity Assessment: Generalized weakness   Lower Extremity Assessment Lower Extremity Assessment: Generalized weakness;LLE deficits/detail LLE Deficits / Details: very painful LLE: Unable to fully assess due to pain LLE Coordination: decreased gross motor       Communication Communication Communication: No difficulties   Cognition Arousal/Alertness: Lethargic Behavior During Therapy: WFL for tasks assessed/performed, Restless Overall Cognitive Status: Within Functional Limits for tasks assessed                                                  Home Living Family/patient expects to be discharged to:: Private residence Living Arrangements: Alone Available Help at Discharge: Personal care attendant;Neighbor;Family Type of Home: House Home Access: Stairs to enter     Home Layout: Two level;Able to live on main level with bedroom/bathroom Alternate Level Stairs-Number of Steps: has to go up/down steps to get to her bathroom and she has not been able to do that recently. Has been using BSC Alternate Level Stairs-Rails: Left Bathroom Shower/Tub: Sponge bathes at baseline   Bathroom Toilet: Standard     Home Equipment: BSC/3in1          Prior Functioning/Environment Prior Level of Function : Needs assist             Mobility Comments: son reports patient gets around her house mainly with wheelchair. ADLs Comments: has PCA 2 hours a day 2x per week. Needs more than that per son.         OT Problem List: Decreased strength;Decreased activity tolerance;Decreased safety awareness;Increased edema;Pain      OT Treatment/Interventions: Self-care/ADL training;Therapeutic exercise;Patient/family education;Energy conservation;Therapeutic activities    OT Goals(Current goals can be found in the care plan section) Acute Rehab OT Goals Patient Stated Goal: to get better OT Goal Formulation: With patient/family Time For Goal Achievement: 11/04/22 Potential to Achieve Goals: Fair ADL Goals Pt Will Perform Lower Body Bathing: with mod assist Pt Will Perform Lower Body Dressing: with mod assist Pt Will Transfer to Toilet: with mod assist Pt Will Perform Toileting - Clothing Manipulation and hygiene: with mod assist  OT Frequency: Min 2X/week    Co-evaluation              AM-PAC OT "6 Clicks" Daily Activity     Outcome Measure Help from another person eating meals?: A Lot Help from another person taking care of personal grooming?: A Lot Help from another person toileting, which includes using toliet, bedpan, or urinal?: A  Lot Help from another person bathing (including washing, rinsing, drying)?: A Lot Help from another person to put on and taking off regular upper body clothing?: A Little Help from another person to put on and taking off regular lower body clothing?: A Lot 6 Click Score: 13   End of Session Equipment Utilized During Treatment: Other (comment) (none) Nurse Communication: Mobility status  Activity Tolerance: Patient limited by lethargy;Patient limited by pain Patient left: in bed;with call bell/phone within reach;with bed alarm set;with family/visitor present  OT Visit Diagnosis: Unsteadiness on feet (R26.81);Muscle weakness (generalized) (M62.81);Pain Pain - part of body: Hip;Leg                Time: 1358-1410 OT Time Calculation (min): 12 min Charges:  OT General Charges $OT Visit: 1 Visit OT Evaluation $OT Eval Moderate Complexity: [place], OTS 10/21/2022, 2:31 PM

## 2022-10-21 NOTE — Assessment & Plan Note (Addendum)
A1c in 2022 at 8.5, now up to 10.5.  Covering with sliding scale.  Renal function overall at baseline, slightly improved today

## 2022-10-21 NOTE — Assessment & Plan Note (Signed)
Continue statin. 

## 2022-10-21 NOTE — Evaluation (Signed)
Physical Therapy Evaluation Patient Details Name: Linda Hess MRN: [id number] DOB: [birth year] Today's Date: 10/21/2022  History of Present Illness  Linda Hess is a 88 y.o. female with medical history significant of type 2 diabetes, CIDP, essential hypertension, history of DVT of the left leg, mixed hyperlipidemia who presented to the ER after sustaining a fall today.  Patient has been having chronic left-sided hip pain and has not walked for weeks.  Per patient so she has been getting up moving apparently between furniture.  Was seen by orthopedics recently and told she has chronic left femoral neck fracture.  She was given pain medication but pain has persisted.  Patient was seen by Dr. Arther Abbott last week.  In the ER today she was having pain more than 10 out of 10.  No obvious fracture on CT and x-ray.  Symptoms however indicates possible new fracture.  Also swelling to bilateral legs but patient has history of chronic lymphedema.  At this point she is being admitted for pain management and reevaluation for possible new fracture.  Patient also has lumbar compression fracture especially over the L1.   Clinical Impression  Patient received morphine for pain control and was feeling a little better. Able to attempt supine to sit with mod A. She continues to have moderate pain with mobility, but not as severe as earlier. Patient will benefit from continued skilled PT while here to work on mobility, strength and improve functional independence.       Recommendations for follow up therapy are one component of a multi-disciplinary discharge planning process, led by the attending physician.  Recommendations may be updated based on patient status, additional functional criteria and insurance authorization.  Follow Up Recommendations Skilled nursing-short term rehab (<3 hours/day) Can patient physically be transported by private vehicle: No    Assistance Recommended at Discharge Frequent  or constant Supervision/Assistance  Patient can return home with the following  A lot of help with walking and/or transfers;A lot of help with bathing/dressing/bathroom    Equipment Recommendations None recommended by PT  Recommendations for Other Services       Functional Status Assessment Patient has had a recent decline in their functional status and demonstrates the ability to make significant improvements in function in a reasonable and predictable amount of time.     Precautions / Restrictions Precautions Precautions: Fall Restrictions Weight Bearing Restrictions: No      Mobility  Bed Mobility Overal bed mobility: Needs Assistance Bed Mobility: Supine to Sit, Sit to Supine Rolling: +2 for physical assistance, Mod assist   Supine to sit: Mod assist Sit to supine: Mod assist   General bed mobility comments: limited by pain    Transfers                   General transfer comment: unable to attempt due to pain    Ambulation/Gait                  Stairs            Wheelchair Mobility    Modified Rankin (Stroke Patients Only)       Balance                                             Pertinent Vitals/Pain Pain Assessment Pain Assessment: 0-10 Pain Score: 8  Pain Location: Left  hip Pain Descriptors / Indicators: Discomfort, Sore, Crying Pain Intervention(s): Patient requesting pain meds-RN notified    Home Living Family/patient expects to be discharged to:: Private residence Living Arrangements: Alone Available Help at Discharge: Personal care attendant;Neighbor;Family Type of Home: House Home Access: Stairs to enter     Alternate Level Stairs-Number of Steps: has to go up/down steps to get to her bathroom and she has not been able to do that recently. Has been using [facility] Layout: Two level;Able to live on main level with bedroom/bathroom Home Equipment: Rolling Walker (2 wheels);Wheelchair - manual       Prior Function Prior Level of Function : Needs assist             Mobility Comments: son reports patient gets around her house mainly with wheelchair. ADLs Comments: has PCA 2 hours a day 2x per week. Needs more than that per son.     Hand Dominance        Extremity/Trunk Assessment   Upper Extremity Assessment Upper Extremity Assessment: Defer to OT evaluation    Lower Extremity Assessment Lower Extremity Assessment: LLE deficits/detail LLE Deficits / Details: very painful LLE: Unable to fully assess due to pain LLE Coordination: decreased gross motor       Communication   Communication: No difficulties  Cognition Arousal/Alertness: Awake/alert Behavior During Therapy: WFL for tasks assessed/performed Overall Cognitive Status: Within Functional Limits for tasks assessed                                          General Comments      Exercises     Assessment/Plan    PT Assessment Patient needs continued PT services  PT Problem List Decreased strength;Decreased mobility;Decreased activity tolerance;Decreased balance;Pain       PT Treatment Interventions DME instruction;Therapeutic exercise;Balance training;Gait training;Functional mobility training;Therapeutic activities;Stair training;Patient/family education;Neuromuscular re-education    PT Goals (Current goals can be found in the Care Plan section)  Acute Rehab PT Goals Patient Stated Goal: to go to rehab PT Goal Formulation: With patient/family Time For Goal Achievement: 11/04/22 Potential to Achieve Goals: Good    Frequency Min 2X/week     Co-evaluation               AM-PAC PT "6 Clicks" Mobility  Outcome Measure Help needed turning from your back to your side while in a flat bed without using bedrails?: A Lot Help needed moving from lying on your back to sitting on the side of a flat bed without using bedrails?: A Lot Help needed moving to and from a bed to a chair  (including a wheelchair)?: Total Help needed standing up from a chair using your arms (e.g., wheelchair or bedside chair)?: Total Help needed to walk in hospital room?: Total Help needed climbing 3-5 steps with a railing? : Total 6 Click Score: 8    End of Session Equipment Utilized During Treatment: Oxygen Activity Tolerance: Patient limited by pain Patient left: in bed;with call bell/phone within reach;with bed alarm set;with family/visitor present Nurse Communication: Mobility status PT Visit Diagnosis: Other abnormalities of gait and mobility (R26.89);Pain;Muscle weakness (generalized) (M62.81) Pain - Right/Left: Left Pain - part of body: Hip    Time:  (then 11:00-11:10)-1010 PT Time Calculation (min) (ACUTE ONLY): 9 min   Charges:   PT Evaluation $PT Eval Moderate Complexity: 1 Mod PT Treatments $Therapeutic Activity: 8-22 mins  Linda Hess, PT, GCS 10/21/22,11:46 AM

## 2022-10-21 NOTE — Assessment & Plan Note (Addendum)
Working on pain control.  No evidence of new fracture seen on CT.  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  Responding to pain medication.  Trying a regimen of scheduled Robaxin as needed Oxy IR.  Needs skilled nursing, hopefully Monday.

## 2022-10-21 NOTE — Progress Notes (Signed)
[facility] Progress Note  Patient: Linda Hess    OZH:[id number]  DOA: 10/20/2022    Date of Service: the patient was seen and examined on 10/21/2022  Brief hospital course: 88 year old female with past medical history of diabetes mellitus type 2, CIDP, hypertension, DVT of the left leg on Xarelto and hyperlipidemia who presented to the emergency department 10/24 after a fall.  Patient has been minimally mobile for the past few weeks secondary to chronic left-sided hip pain and had been recently told by orthopedic surgery that she had a chronic left femoral neck fracture.  No relief with pain medication.  After fall, patient had new pain.  No evidence of fracture seen on CT.  Patient brought in for further evaluation.  Assessment and Plan: Assessment and Plan: * Fall Seen by PT and OT who are recommending skilled nursing.  Acute on chronic diastolic CHF (congestive heart failure) (HCC) Mild volume overload.  Echocardiogram in 2015 notes grade 1 diastolic dysfunction.  We will recheck echo.  Gently diurese.  CIDP (chronic inflammatory demyelinating polyneuropathy) ([place]) Working on pain control.  No evidence of new fracture seen on CT.  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  Responding to pain medication.  Needs skilled nursing.  Essential hypertension, benign Blood pressure stable during hospitalization  Type 2 diabetes mellitus with chronic kidney disease, without long-term current use of insulin (HCC) A1c in 2022 at 8.5.  We will recheck.  Patient with mild hyperglycemia.  For now, sliding scale  Mixed hyperlipidemia Continue statin  History of DVT: Continue Eliquis    Body mass index is 23.45 kg/m.        Consultants: None  Procedures: None  Antimicrobials: None none  Code Status: Full code   Subjective: Resting comfortably  Objective: Vital signs were reviewed and unremarkable. Vitals:    10/21/22 0632 10/21/22 1554  BP: (!) 112/102 (!) 112/58  Pulse: 78 73  Resp: 18 18  Temp: 99.2 F (37.3 C) 98.4 F (36.9 C)  SpO2: 100% 100%    Intake/Output Summary (Last 24 hours) at 10/21/2022 1901 Last data filed at 10/21/2022 1630 Gross per 24 hour  Intake 709.96 ml  Output --  Net 709.96 ml   Filed Weights   10/20/22 2008 10/21/22 0127  Weight: 67.6 kg 65.9 kg   Body mass index is 23.45 kg/m.  Exam:  General: Resting comfortably HEENT: Normocephalic, atraumatic, mucous membranes are moist Cardiovascular: Regular rate and rhythm, S1-S2 Respiratory: Clear to auscultation bilaterally Abdomen: Soft, nontender, nondistended, positive bowel sounds Musculoskeletal: 1-2+ pitting edema with signs of CIDP chronic changes Skin: As above Psychiatry: Appropriate, no evidence of psychoses Neurology: Decreased level of sensation in lower extremities due to CIDP  Data Reviewed: Creatinine with slight increase today  Disposition:  Status is: Inpatient Remains inpatient appropriate because:  -Needs skilled nursing    Anticipated discharge date: 10/27  Family Communication: We will call family DVT Prophylaxis: apixaban (ELIQUIS) tablet 2.5 mg Start: 10/21/22 1900 apixaban (ELIQUIS) tablet 2.5 mg    Author: Annita Brod ,MD 10/21/2022 7:01 PM  To reach On-call, see care teams to locate the attending and reach out via [email or web address]. Between 7PM-7AM, please contact night-coverage If you still have difficulty reaching the attending provider, please page the [facility] (Director on Call) for [facility] on amion for assistance.

## 2022-10-21 NOTE — Hospital Course (Signed)
88 year old female with past medical history of diabetes mellitus type 2, CIDP, hypertension, DVT of the left leg on Xarelto and hyperlipidemia who presented to the emergency department 10/24 after a fall.  Patient has been minimally mobile for the past few weeks secondary to chronic left-sided hip pain and had been recently told by orthopedic surgery that she had a chronic left femoral neck fracture.  No relief with pain medication.  After fall, patient had new pain.  No evidence of fracture seen on CT.  Patient brought in for further evaluation.

## 2022-10-21 NOTE — Assessment & Plan Note (Signed)
Seen by PT and OT who are recommending skilled nursing.

## 2022-10-21 NOTE — ED Notes (Signed)
2lpm O2 placed on pt due to pt desatting when sleeping

## 2022-10-21 NOTE — Assessment & Plan Note (Addendum)
Blood pressure stable during hospitalization

## 2022-10-21 NOTE — Assessment & Plan Note (Addendum)
Mild volume overload.  Echocardiogram in 2015 notes grade 1 diastolic dysfunction.  Echo during this hospitalization notes grade 2 diastolic dysfunction and preserved ejection fraction.  Lasix x1.  BNP at 525 --pt appears at baseline. No resp distress. LE edema improving

## 2022-10-22 DIAGNOSIS — R262 Difficulty in walking, not elsewhere classified: Secondary | ICD-10-CM

## 2022-10-22 DIAGNOSIS — L89322 Pressure ulcer of left buttock, stage 2: Secondary | ICD-10-CM | POA: Diagnosis not present

## 2022-10-22 DIAGNOSIS — L8990 Pressure ulcer of unspecified site, unspecified stage: Secondary | ICD-10-CM | POA: Diagnosis present

## 2022-10-22 DIAGNOSIS — G6181 Chronic inflammatory demyelinating polyneuritis: Secondary | ICD-10-CM | POA: Diagnosis not present

## 2022-10-22 DIAGNOSIS — I5033 Acute on chronic diastolic (congestive) heart failure: Secondary | ICD-10-CM | POA: Diagnosis not present

## 2022-10-22 LAB — GLUCOSE, CAPILLARY
Glucose-Capillary: 103 mg/dL — ABNORMAL HIGH (ref 70–99)
Glucose-Capillary: 181 mg/dL — ABNORMAL HIGH (ref 70–99)
Glucose-Capillary: 274 mg/dL — ABNORMAL HIGH (ref 70–99)
Glucose-Capillary: 239 mg/dL — ABNORMAL HIGH (ref 70–99)

## 2022-10-22 LAB — BASIC METABOLIC PANEL
Anion gap: 6 (ref 5–15)
BUN: 31 mg/dL — ABNORMAL HIGH (ref 8–23)
CO2: 27 mmol/L (ref 22–32)
Calcium: 8.6 mg/dL — ABNORMAL LOW (ref 8.9–10.3)
Chloride: 107 mmol/L (ref 98–111)
Creatinine, Ser: 1.08 mg/dL — ABNORMAL HIGH (ref 0.44–1.00)
GFR, Estimated: 49 mL/min — ABNORMAL LOW (ref >60)
Glucose, Bld: 116 mg/dL — ABNORMAL HIGH (ref 70–99)
Potassium: 3.9 mmol/L (ref 3.5–5.1)
Sodium: 140 mmol/L (ref 135–145)

## 2022-10-22 LAB — BRAIN NATRIURETIC PEPTIDE: B Natriuretic Peptide: 551.5 pg/mL — ABNORMAL HIGH (ref 0.0–100.0)

## 2022-10-22 NOTE — TOC Initial Note (Signed)
Transition of Care [facility]) - Initial/Assessment Note    Patient Details  Name: Linda Hess MRN: [id number] Date of Birth: March 13, 1931  Transition of [facility] CM/SW Contact:    Beverly Sessions, RN Phone Number: 10/22/2022, 2:14 PM  Clinical Narrative:                  Admitted WFU:XNAT weakness Admitted from:home alone. PCS services 2 hours a day/ 2 days a week PCP: Bartolo Darter - family and neighbor transports to appointments    Therapy recommending SNF Discussed with son Kasandra Knudsen, who deferred to son Kathreen Cosier as the decision maker  He is in agreement.  They are aware that patient does not have a long term care payor and will only be going to [facility] sent for signature Bed search initiated        Patient Goals and CMS Choice        Expected Discharge Plan and Services                                                Prior Living Arrangements/Services                       Activities of Daily Living Home Assistive Devices/Equipment: Gilford Rile (specify type) ADL Screening (condition at time of admission) Patient's cognitive ability adequate to safely complete daily activities?: Yes Is the patient deaf or have difficulty hearing?: No Does the patient have difficulty seeing, even when wearing glasses/contacts?: No Does the patient have difficulty concentrating, remembering, or making decisions?: No Patient able to express need for assistance with ADLs?: Yes Does the patient have difficulty dressing or bathing?: Yes Independently performs ADLs?: No Communication: Independent Dressing (OT): Independent Grooming: Independent Feeding: Independent Bathing: Needs assistance Is this a change from baseline?: Pre-admission baseline Toileting: Needs assistance Is this a change from baseline?: Pre-admission baseline In/Out Bed: Needs assistance Is this a change from baseline?: Pre-admission baseline Walks in Home: Needs assistance Is this a  change from baseline?: Pre-admission baseline Does the patient have difficulty walking or climbing stairs?: Yes Weakness of Legs: Both Weakness of Arms/Hands: None  Permission Sought/Granted                  Emotional Assessment              Admission diagnosis:  Fall [W19.XXXA] Left hip pain [M25.552] Left flank pain [R10.9] Ambulatory dysfunction [R26.2] Patient Active Problem List   Diagnosis Date Noted   Pressure injury of skin 10/22/2022   Acute on chronic diastolic CHF (congestive heart failure) ([place]) 10/21/2022   Fall 10/20/2022   Acute back pain with sciatica 10/06/2022   Hip pain 10/06/2022   Lumbar spondylosis 10/06/2022   Spinal stenosis of lumbar region 10/06/2022   Diabetic peripheral neuropathy ([place]) 03/24/2022   Foot pain 03/24/2022   Hallux valgus 03/24/2022   Ingrowing toenail 03/24/2022   Tibialis posterior tendinitis 03/24/2022   Leg swelling 07/24/2021   DVT (deep venous thrombosis) ([place]) 07/24/2021   Cellulitis of right upper extremity 12/14/2018   Peripheral arterial disease ([place]) 03/14/2014   CIDP (chronic inflammatory demyelinating polyneuropathy) ([place]) 03/14/2014   Mixed hyperlipidemia 03/12/2014   Essential hypertension, benign 03/12/2014   Type 2 diabetes mellitus with chronic kidney disease, without long-term current use of insulin ([place]) 03/12/2014   PCP:  Smith Robert, MD Pharmacy:   [facility] - [place], [place] - [address] [place] [address] Phone: [phone] Fax: [phone]     Social Determinants of Health (SDOH) Interventions    Readmission Risk Interventions     No data to display

## 2022-10-22 NOTE — NC FL2 (Signed)
[place] LEVEL OF CARE SCREENING TOOL     IDENTIFICATION  Patient Name: Linda Hess Birthdate: 01-Nov-1931 Sex: female Admission Date (Current Location): 10/20/2022  [facility] and [place] Number:  Engineering geologist and Address:         Provider Number: [id number]  Attending Physician Name and Address:  Annita Brod, MD  Relative Name and Phone Number:       Current Level of Care: Hospital Recommended Level of Care: [place] Prior Approval Number:    Date Approved/Denied:   PASRR Number: [id number]  Discharge Plan: SNF    Current Diagnoses: Patient Active Problem List   Diagnosis Date Noted   Pressure injury of skin 10/22/2022   Acute on chronic diastolic CHF (congestive heart failure) ([place]) 10/21/2022   Fall 10/20/2022   Acute back pain with sciatica 10/06/2022   Hip pain 10/06/2022   Lumbar spondylosis 10/06/2022   Spinal stenosis of lumbar region 10/06/2022   Diabetic peripheral neuropathy ([place]) 03/24/2022   Foot pain 03/24/2022   Hallux valgus 03/24/2022   Ingrowing toenail 03/24/2022   Tibialis posterior tendinitis 03/24/2022   Leg swelling 07/24/2021   DVT (deep venous thrombosis) ([place]) 07/24/2021   Cellulitis of right upper extremity 12/14/2018   Peripheral arterial disease ([place]) 03/14/2014   CIDP (chronic inflammatory demyelinating polyneuropathy) ([place]) 03/14/2014   Mixed hyperlipidemia 03/12/2014   Essential hypertension, benign 03/12/2014   Type 2 diabetes mellitus with chronic kidney disease, without long-term current use of insulin ([place]) 03/12/2014    Orientation RESPIRATION BLADDER Height & Weight     Self, Time, Situation, Place  Normal Incontinent Weight: 65.9 kg Height:  5\' 6"  (167.6 cm)  BEHAVIORAL SYMPTOMS/MOOD NEUROLOGICAL BOWEL NUTRITION STATUS      Continent Diet (Carb modified)  AMBULATORY STATUS COMMUNICATION OF NEEDS Skin   Extensive Assist Verbally PU Stage and Appropriate Care                        Personal Care Assistance Level of Assistance              Functional Limitations Info             SPECIAL CARE FACTORS FREQUENCY  PT (By licensed PT), OT (By licensed OT)                    Contractures Contractures Info: Not present    Additional Factors Info  Code Status, Allergies Code Status Info: Full Allergies Info: Amoxicillin-pot clavulante, red meat, penicillin           Current Medications (10/22/2022):  This is the current hospital active medication list Current Facility-Administered Medications  Medication Dose Route Frequency Provider Last Rate Last Admin   acetaminophen (TYLENOL) tablet 325 mg  325 mg Oral Q6H PRN Elwyn Reach, MD       apixaban (ELIQUIS) tablet 2.5 mg  2.5 mg Oral BID Lorna Dibble, RPH   2.5 mg at 10/22/22 [id number]   ascorbic acid (VITAMIN C) tablet 500 mg  500 mg Oral Daily Gala Romney L, MD   500 mg at 10/22/22 0806   calcium-vitamin D (OSCAL WITH D) 500-5 MG-MCG per tablet 1 tablet  1 tablet Oral QHS Elwyn Reach, MD   1 tablet at 10/21/22 2222   furosemide (LASIX) injection 20 mg  20 mg Intravenous BID Annita Brod, MD   20 mg at 10/22/22 0807   gabapentin (NEURONTIN) capsule 100  mg  100 mg Oral TID Gala Romney L, MD   100 mg at 10/22/22 0806   insulin aspart (novoLOG) injection 0-15 Units  0-15 Units Subcutaneous TID WC Gala Romney L, MD   3 Units at 10/22/22 1209   insulin aspart (novoLOG) injection 0-5 Units  0-5 Units Subcutaneous QHS Elwyn Reach, MD   2 Units at 10/21/22 2222   linagliptin (TRADJENTA) tablet 5 mg  5 mg Oral Daily Gala Romney L, MD   5 mg at 10/22/22 0818   methocarbamol (ROBAXIN) 500 mg in dextrose 5 % 50 mL IVPB  500 mg Intravenous Q6H PRN Gala Romney L, MD       morphine (PF) 4 MG/ML injection 4 mg  4 mg Intravenous Q2H PRN Gala Romney L, MD   4 mg at 10/22/22 1115   omega-3 acid ethyl esters (LOVAZA) capsule 1 g  1 capsule Oral q morning  Gala Romney L, MD   1 g at 10/22/22 0806   ondansetron (ZOFRAN) tablet 4 mg  4 mg Oral Q6H PRN Elwyn Reach, MD       Or   ondansetron (ZOFRAN) injection 4 mg  4 mg Intravenous Q6H PRN Elwyn Reach, MD       oxybutynin (DITROPAN) tablet 2.5 mg  2.5 mg Oral BID Gala Romney L, MD   2.5 mg at 10/22/22 0102   oxyCODONE (Oxy IR/ROXICODONE) immediate release tablet 5 mg  5 mg Oral Q4H PRN Annita Brod, MD   5 mg at 10/21/22 1800   predniSONE (DELTASONE) tablet 10 mg  10 mg Oral Q breakfast Gala Romney L, MD   10 mg at 10/22/22 0806   rosuvastatin (CRESTOR) tablet 10 mg  10 mg Oral Daily Elwyn Reach, MD   10 mg at 10/22/22 7253     Discharge Medications: Please see discharge summary for a list of discharge medications.  Relevant Imaging Results:  Relevant Lab Results:   Additional Information ss [id number]  Beverly Sessions, RN

## 2022-10-22 NOTE — Assessment & Plan Note (Signed)
Pressure Injury 10/21/22 Buttocks Left Stage 2 -  Partial thickness loss of dermis presenting as a shallow open injury with a red, pink wound bed without slough. (Active)  10/21/22 1800  Location: Buttocks  Location Orientation: Left  Staging: Stage 2 -  Partial thickness loss of dermis presenting as a shallow open injury with a red, pink wound bed without slough.  Wound Description (Comments):   Present on Admission: Yes   Stage II decubitus ulcer of left buttock, present on admission.  Wound care

## 2022-10-22 NOTE — Progress Notes (Signed)
       CROSS COVER NOTE  NAME: Linda Hess MRN: [id number] DOB : Apr 25, 1931       HPI/Events of Note   Nurse reports concern about  patient with increase in 3+ edema and ongoing maintenance IV fluids  Assessment and  Interventions   Assessment: Patient with diastolic dysfunction, BNP 857 and fluid volume overload Plan: Discontinue IV fluids        Kathlene Cote NP [facility]

## 2022-10-22 NOTE — Progress Notes (Signed)
[facility] Progress Note  Patient: Linda Hess    [id number]  DOA: 10/20/2022    Date of Service: the patient was seen and examined on 10/22/2022  Brief hospital course: 88 year old female with past medical history of diabetes mellitus type 2, CIDP, hypertension, DVT of the left leg on Xarelto and hyperlipidemia who presented to the emergency department 10/24 after a fall.  Patient has been minimally mobile for the past few weeks secondary to chronic left-sided hip pain and had been recently told by orthopedic surgery that she had a chronic left femoral neck fracture.  No relief with pain medication.  After fall, patient had new pain.  No evidence of fracture seen on CT.  Patient brought in for further evaluation.  Assessment and Plan: Assessment and Plan: * Fall Seen by PT and OT who are recommending skilled nursing.  Acute on chronic diastolic CHF (congestive heart failure) (HCC) Mild volume overload.  Echocardiogram in 2015 notes grade 1 diastolic dysfunction.  We will recheck echo.  Gently diurese.  BNP at 525  CIDP (chronic inflammatory demyelinating polyneuropathy) ([place]) Working on pain control.  No evidence of new fracture seen on CT.  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  Responding to pain medication.  Needs skilled nursing.  Essential hypertension, benign Blood pressure stable during hospitalization  Type 2 diabetes mellitus with chronic kidney disease, without long-term current use of insulin (HCC) A1c in 2022 at 8.5.  We will recheck.  Patient with mild hyperglycemia.  For now, sliding scale  Mixed hyperlipidemia Continue statin  Pressure injury of skin Pressure Injury 10/21/22 Buttocks Left Stage 2 -  Partial thickness loss of dermis presenting as a shallow open injury with a red, pink wound bed without slough. (Active)  10/21/22 1800  Location: Buttocks  Location Orientation: Left   Staging: Stage 2 -  Partial thickness loss of dermis presenting as a shallow open injury with a red, pink wound bed without slough.  Wound Description (Comments):   Present on Admission: Yes   Stage II decubitus ulcer of left buttock, present on admission.  Wound care   History of DVT: Continue Eliquis    Body mass index is 23.45 kg/m.    Pressure Injury 10/21/22 Buttocks Left Stage 2 -  Partial thickness loss of dermis presenting as a shallow open injury with a red, pink wound bed without slough. (Active)  10/21/22 1800  Location: Buttocks  Location Orientation: Left  Staging: Stage 2 -  Partial thickness loss of dermis presenting as a shallow open injury with a red, pink wound bed without slough.  Wound Description (Comments):   Present on Admission: Yes  Dressing Type Foam - Lift dressing to assess site every shift 10/22/22 0808     Consultants: None  Procedures: None  Antimicrobials: None  Code Status: Full code   Subjective: Patient tired, otherwise no complaints  Objective: Vital signs were reviewed and unremarkable. Vitals:   10/22/22 0740 10/22/22 1539  BP: 100/71 119/78  Pulse: 68 79  Resp: 16 16  Temp: 98 F (36.7 C) (!) 97.4 F (36.3 C)  SpO2: 97% 98%    Intake/Output Summary (Last 24 hours) at 10/22/2022 1543 Last data filed at 10/22/2022 1417 Gross per 24 hour  Intake 1539.63 ml  Output 1350 ml  Net 189.63 ml    Filed Weights   10/20/22 2008 10/21/22 0127  Weight: 67.6 kg 65.9 kg   Body mass  index is 23.45 kg/m.  Exam:  General: Oriented x2, no complaints HEENT: Normocephalic, atraumatic, mucous membranes are moist Cardiovascular: Regular rate and rhythm, S1-S2 Respiratory: Clear to auscultation bilaterally Abdomen: Soft, nontender, nondistended, positive bowel sounds Musculoskeletal: 1-2+ pitting edema with signs of CIDP chronic changes Skin: As above Psychiatry: Appropriate, no evidence of psychoses Neurology: Decreased  level of sensation in lower extremities due to CIDP  Data Reviewed: BNP of 550.  Creatinine with slight improvement  Disposition:  Status is: Inpatient Remains inpatient appropriate because:  -Needs skilled nursing    Anticipated discharge date: 10/27  Family Communication: Son at the bedside DVT Prophylaxis: apixaban (ELIQUIS) tablet 2.5 mg Start: 10/21/22 1900 apixaban (ELIQUIS) tablet 2.5 mg    Author: Annita Brod ,MD 10/22/2022 3:43 PM  To reach On-call, see care teams to locate the attending and reach out via [email or web address]. Between 7PM-7AM, please contact night-coverage If you still have difficulty reaching the attending provider, please page the [facility] (Director on Call) for [facility] on amion for assistance.

## 2022-10-22 NOTE — Progress Notes (Signed)
   10/22/22 0900  Clinical Encounter Type  Visited With Patient and family together  Visit Type Initial   Chaplain responded to [facility] consult requesting information on AD. Information packet given and explained. Family will have Chaplain paged when documentation completed.

## 2022-10-22 NOTE — Progress Notes (Signed)
Occupational Therapy Treatment Patient Details Name: Linda Hess MRN: [id number] DOB: Nov 13, 1931 Today's Date: 10/22/2022   History of present illness Linda Hess is a 88 y.o. female with medical history significant of type 2 diabetes, CIDP, essential hypertension, history of DVT of the left leg, mixed hyperlipidemia who presented to the ER after sustaining a fall today.  Patient has been having chronic left-sided hip pain and has not walked for weeks.  Per patient so she has been getting up moving apparently between furniture.  Was seen by orthopedics recently and told she has chronic left femoral neck fracture.  She was given pain medication but pain has persisted.  Patient was seen by Dr. Arther Abbott last week.  In the ER today she was having pain more than 10 out of 10.  No obvious fracture on CT and x-ray.  Symptoms however indicates possible new fracture.  Also swelling to bilateral legs but patient has history of chronic lymphedema.  At this point she is being admitted for pain management and reevaluation for possible new fracture.  Patient also has lumbar compression fracture especially over the L1.   OT comments  Patient in bed, awake, son present in room, and agreeable to OT services. LB dressing performed max A. Grooming task perform (washing face) with supervision, requiring multiple VC to initiate task. Patient was able to perform bed mobility supine>sit with min A. Transfered sit> stand with max A. Patient took 1 side step towards HOB. Static sitting balance with close supervision for 5 minutes. Emotionally labile throughout session, requiring frequent redirection and encouragement. Blister observed on LLE and pain rated 10/10. Nurse notified. Patient left in bed with call bell in reach, bed alarm set, and all needs met.    Recommendations for follow up therapy are one component of a multi-disciplinary discharge planning process, led by the attending physician.   Recommendations may be updated based on patient status, additional functional criteria and insurance authorization.    Follow Up Recommendations  Skilled nursing-short term rehab (<3 hours/day)    Assistance Recommended at Discharge Frequent or constant Supervision/Assistance  Patient can return home with the following  Two people to help with walking and/or transfers;A lot of help with bathing/dressing/bathroom;Help with stairs or ramp for entrance;Direct supervision/assist for financial management;Assistance with cooking/housework;Assist for transportation;Direct supervision/assist for medications management   Equipment Recommendations  Other (comment) (Defer to next venue of care)       Precautions / Restrictions Precautions Precautions: Fall Restrictions Weight Bearing Restrictions: No       Mobility Bed Mobility Overal bed mobility: Needs Assistance Bed Mobility: Supine to Sit, Sit to Supine     Supine to sit: Min assist Sit to supine: Max assist, +2 for safety/equipment, +2 for physical assistance        Transfers Overall transfer level: Needs assistance Equipment used: Rolling walker (2 wheels) Transfers: Sit to/from Stand Sit to Stand: Max assist, +2 physical assistance, +2 safety/equipment                 Balance Overall balance assessment: Needs assistance Sitting-balance support: Feet supported, Bilateral upper extremity supported Sitting balance-Leahy Scale: Fair     Standing balance support: Reliant on assistive device for balance, Bilateral upper extremity supported Standing balance-Leahy Scale: Poor                             ADL either performed or assessed with clinical judgement   ADL  Grooming: Supervision/safety;Wash/dry face Grooming Details (indicate cue type and reason): multiple VC to wash face             Lower Body Dressing: Maximal assistance                      Extremity/Trunk Assessment  Upper Extremity Assessment Upper Extremity Assessment: Generalized weakness   Lower Extremity Assessment Lower Extremity Assessment: Generalized weakness        Vision Patient Visual Report: No change from baseline            Cognition Arousal/Alertness: Awake/alert Behavior During Therapy: WFL for tasks assessed/performed, Restless Overall Cognitive Status: Within Functional Limits for tasks assessed                                                     Pertinent Vitals/ Pain       Pain Assessment Pain Score: 10-Worst pain ever Pain Location: Legs Pain Descriptors / Indicators: Discomfort, Sore, Crying Pain Intervention(s): Limited activity within patient's tolerance, Patient requesting pain meds-RN notified, Monitored during session, Repositioned   Frequency  Min 2X/week        Progress Toward Goals  OT Goals(current goals can now be found in the care plan section)  Progress towards OT goals: Progressing toward goals  Acute Rehab OT Goals Patient Stated Goal: to get better OT Goal Formulation: With patient/family Time For Goal Achievement: 11/04/22 Potential to Achieve Goals: Fair   AM-PAC OT "6 Clicks" Daily Activity     Outcome Measure   Help from another person eating meals?: A Lot Help from another person taking care of personal grooming?: A Little Help from another person toileting, which includes using toliet, bedpan, or urinal?: A Lot Help from another person bathing (including washing, rinsing, drying)?: A Lot Help from another person to put on and taking off regular upper body clothing?: None Help from another person to put on and taking off regular lower body clothing?: A Lot 6 Click Score: 15    End of Session Equipment Utilized During Treatment: Rolling walker (2 wheels)  OT Visit Diagnosis: Unsteadiness on feet (R26.81);Muscle weakness (generalized) (M62.81);Pain Pain - Right/Left: Right (Both legs) Pain - part of body:  Leg   Activity Tolerance Patient limited by pain   Patient Left in bed;with call bell/phone within reach;with bed alarm set;with family/visitor present   Nurse Communication Mobility status;Patient requests pain meds (Blister on LLE)        Time: 1455-1519 OT Time Calculation (min): 24 min  Charges: OT General Charges $OT Visit: 1 Visit OT Treatments $Self Care/Home Management : 8-22 mins $Therapeutic Activity: 8-22 mins     Cherity Blickenstaff, OTS 10/22/2022, 3:33 PM

## 2022-10-23 ENCOUNTER — Inpatient Hospital Stay (HOSPITAL_COMMUNITY)
Admit: 2022-10-23 | Discharge: 2022-10-23 | Disposition: A | Discharge status: Home / Self Care | Payer: Medicare Other | Attending: Internal Medicine | Admitting: Internal Medicine

## 2022-10-23 DIAGNOSIS — I5033 Acute on chronic diastolic (congestive) heart failure: Secondary | ICD-10-CM | POA: Diagnosis not present

## 2022-10-23 DIAGNOSIS — L89322 Pressure ulcer of left buttock, stage 2: Secondary | ICD-10-CM | POA: Diagnosis not present

## 2022-10-23 DIAGNOSIS — G6181 Chronic inflammatory demyelinating polyneuritis: Secondary | ICD-10-CM | POA: Diagnosis not present

## 2022-10-23 DIAGNOSIS — I5031 Acute diastolic (congestive) heart failure: Secondary | ICD-10-CM | POA: Diagnosis not present

## 2022-10-23 DIAGNOSIS — R262 Difficulty in walking, not elsewhere classified: Secondary | ICD-10-CM | POA: Diagnosis not present

## 2022-10-23 LAB — BASIC METABOLIC PANEL
Anion gap: 7 (ref 5–15)
BUN: 32 mg/dL — ABNORMAL HIGH (ref 8–23)
CO2: 28 mmol/L (ref 22–32)
Calcium: 8.9 mg/dL (ref 8.9–10.3)
Chloride: 101 mmol/L (ref 98–111)
Creatinine, Ser: 1.27 mg/dL — ABNORMAL HIGH (ref 0.44–1.00)
GFR, Estimated: 41 mL/min — ABNORMAL LOW (ref >60)
Glucose, Bld: 167 mg/dL — ABNORMAL HIGH (ref 70–99)
Potassium: 3.9 mmol/L (ref 3.5–5.1)
Sodium: 136 mmol/L (ref 135–145)

## 2022-10-23 LAB — GLUCOSE, CAPILLARY
Glucose-Capillary: 155 mg/dL — ABNORMAL HIGH (ref 70–99)
Glucose-Capillary: 214 mg/dL — ABNORMAL HIGH (ref 70–99)
Glucose-Capillary: 267 mg/dL — ABNORMAL HIGH (ref 70–99)
Glucose-Capillary: 248 mg/dL — ABNORMAL HIGH (ref 70–99)

## 2022-10-23 LAB — ECHOCARDIOGRAM COMPLETE
AR max vel: 2.93 cm2
AV Area VTI: 3.2 cm2
AV Area mean vel: 3.07 cm2
AV Mean grad: 6.7 mmHg
AV Peak grad: 12.3 mmHg
Ao pk vel: 1.76 m/s
Area-P 1/2: 2.51 cm2
Height: 66 in
S' Lateral: 3.1 cm
Weight: 2324.53 oz

## 2022-10-23 MED ORDER — METHOCARBAMOL 500 MG PO TABS
500.0000 mg | ORAL_TABLET | Freq: Three times a day (TID) | ORAL | Status: DC
  Administered 2022-10-23 – 2022-10-26 (×9): 500 mg via ORAL
  Filled 2022-10-23 (×10): qty 1

## 2022-10-23 NOTE — Progress Notes (Signed)
Physical Therapy Treatment Patient Details Name: Linda Hess MRN: [id number] DOB: 1931/03/11 Today's Date: 10/23/2022   History of Present Illness Linda Hess is a 88 y.o. female with medical history significant of type 2 diabetes, CIDP, essential hypertension, history of DVT of the left leg, mixed hyperlipidemia who presented to the ER after sustaining a fall today.  Patient has been having chronic left-sided hip pain and has not walked for weeks.  Per patient so she has been getting up moving apparently between furniture.  Was seen by orthopedics recently and told she has chronic left femoral neck fracture.  She was given pain medication but pain has persisted.  Patient was seen by Dr. Arther Abbott last week.  In the ER today she was having pain more than 10 out of 10.  No obvious fracture on CT and x-ray.  Symptoms however indicates possible new fracture.  Also swelling to bilateral legs but patient has history of chronic lymphedema.  At this point she is being admitted for pain management and reevaluation for possible new fracture.  Patient also has lumbar compression fracture especially over the L1.    PT Comments    Pt initially very anxious about LE's supporting her during standing. Once fear of falling and confidence improved with practice transfers and MinA to raise to standing, pt was able to advance a few feet towards bedside chair with RW and close CGA for safety. Pt remains very motivated to functionally improve and return to baseline LOF. Continue to recommend SNF once medically ready for d/c. Continue PT per POC    Recommendations for follow up therapy are one component of a multi-disciplinary discharge planning process, led by the attending physician.  Recommendations may be updated based on patient status, additional functional criteria and insurance authorization.  Follow Up Recommendations  Skilled nursing-short term rehab (<3 hours/day) Can patient physically be  transported by private vehicle: No   Assistance Recommended at Discharge Frequent or constant Supervision/Assistance  Patient can return home with the following A lot of help with walking and/or transfers;A lot of help with bathing/dressing/bathroom   Equipment Recommendations  None recommended by PT    Recommendations for Other Services       Precautions / Restrictions Precautions Precautions: Fall Restrictions Weight Bearing Restrictions: No     Mobility  Bed Mobility               General bed mobility comments: Pt received EOB    Transfers Overall transfer level: Needs assistance Equipment used: Rolling walker (2 wheels) Transfers: Sit to/from Stand Sit to Stand: Min assist           General transfer comment: Pt reported no pain during transfer and reported it felt good to sit EOB and to stand.    Ambulation/Gait Ambulation/Gait assistance: Min guard Gait Distance (Feet): 3 Feet Assistive device: Rolling walker (2 wheels) Gait Pattern/deviations: Decreased step length - right, Decreased step length - left, Decreased dorsiflexion - right, Decreased dorsiflexion - left       General Gait Details: peripheral neuropathy affecting heel strike   Stairs             Wheelchair Mobility    Modified Rankin (Stroke Patients Only)       Balance Overall balance assessment: Needs assistance Sitting-balance support: Feet supported, No upper extremity supported Sitting balance-Leahy Scale: Good     Standing balance support: Reliant on assistive device for balance, Bilateral upper extremity supported Standing balance-Leahy Scale: Poor  Cognition Arousal/Alertness: Awake/alert Behavior During Therapy: Anxious, WFL for tasks assessed/performed Overall Cognitive Status: Within Functional Limits for tasks assessed                                 General Comments: pt very anxious with standing  activities, doesn't trust her legs        Exercises General Exercises - Lower Extremity Ankle Circles/Pumps: AAROM, Both, 5 reps Long Arc Quad: AAROM, Both, 10 reps Hip Flexion/Marching: AAROM, Both, 5 reps    General Comments        Pertinent Vitals/Pain Pain Assessment Pain Assessment: 0-10 Pain Score: 4  Pain Location: Legs Pain Descriptors / Indicators: Discomfort, Sore Pain Intervention(s): Limited activity within patient's tolerance, Monitored during session    Home Living                          Prior Function            PT Goals (current goals can now be found in the care plan section) Acute Rehab PT Goals Patient Stated Goal: to go to rehab Progress towards PT goals: Progressing toward goals    Frequency    Min 2X/week      PT Plan Current plan remains appropriate    Co-evaluation              AM-PAC PT "6 Clicks" Mobility   Outcome Measure  Help needed turning from your back to your side while in a flat bed without using bedrails?: A Lot Help needed moving from lying on your back to sitting on the side of a flat bed without using bedrails?: A Lot Help needed moving to and from a bed to a chair (including a wheelchair)?: A Lot Help needed standing up from a chair using your arms (e.g., wheelchair or bedside chair)?: A Lot Help needed to walk in hospital room?: A Lot Help needed climbing 3-5 steps with a railing? : Total 6 Click Score: 11    End of Session Equipment Utilized During Treatment: Gait belt Activity Tolerance: Patient tolerated treatment well Patient left: in chair;with call bell/phone within reach;with chair alarm set;with family/visitor present Nurse Communication: Mobility status PT Visit Diagnosis: Other abnormalities of gait and mobility (R26.89);Pain;Muscle weakness (generalized) (M62.81) Pain - Right/Left: Left Pain - part of body: Hip     Time: 1510-1530 PT Time Calculation (min) (ACUTE ONLY): 20  min  Charges:  $Therapeutic Activity: 8-22 mins                    Zadie Cleverly, PTA    Jannet Askew 10/23/2022, 3:52 PM

## 2022-10-23 NOTE — Progress Notes (Signed)
[facility] Progress Note  Patient: Linda Hess    ZOX:[id number]  DOA: 10/20/2022    Date of Service: the patient was seen and examined on 10/23/2022  Brief hospital course: 88 year old female with past medical history of diabetes mellitus type 2, CIDP, hypertension, DVT of the left leg on Xarelto and hyperlipidemia who presented to the emergency department 10/24 after a fall.  Patient has been minimally mobile for the past few weeks secondary to chronic left-sided hip pain and had been recently told by orthopedic surgery that she had a chronic left femoral neck fracture.  No relief with pain medication.  After fall, patient had new pain.  No evidence of fracture seen on CT.  Patient brought in for further evaluation.  Assessment and Plan: Assessment and Plan: * Fall Seen by PT and OT who are recommending skilled nursing.  Acute on chronic diastolic CHF (congestive heart failure) (HCC) Mild volume overload.  Echocardiogram in 2015 notes grade 1 diastolic dysfunction.  We will recheck echo.  Gently diurese.  BNP at 525  CIDP (chronic inflammatory demyelinating polyneuropathy) ([place]) Working on pain control.  No evidence of new fracture seen on CT.  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  Responding to pain medication.  Trying a regimen of scheduled Robaxin as needed Oxy IR.  Needs skilled nursing, hopefully Monday.  Essential hypertension, benign Blood pressure stable during hospitalization  Type 2 diabetes mellitus with chronic kidney disease, without long-term current use of insulin (HCC) A1c in 2022 at 8.5.  We will recheck.  Patient with mild hyperglycemia.  For now, sliding scale  Mixed hyperlipidemia Continue statin  Pressure injury of skin Pressure Injury 10/21/22 Buttocks Left Stage 2 -  Partial thickness loss of dermis presenting as a shallow open injury with a red, pink wound bed without slough.  (Active)  10/21/22 1800  Location: Buttocks  Location Orientation: Left  Staging: Stage 2 -  Partial thickness loss of dermis presenting as a shallow open injury with a red, pink wound bed without slough.  Wound Description (Comments):   Present on Admission: Yes   Stage II decubitus ulcer of left buttock, present on admission.  Wound care   History of DVT: Continue Eliquis    Body mass index is 23.45 kg/m.    Pressure Injury 10/21/22 Buttocks Left Stage 2 -  Partial thickness loss of dermis presenting as a shallow open injury with a red, pink wound bed without slough. (Active)  10/21/22 1800  Location: Buttocks  Location Orientation: Left  Staging: Stage 2 -  Partial thickness loss of dermis presenting as a shallow open injury with a red, pink wound bed without slough.  Wound Description (Comments):   Present on Admission: Yes  Dressing Type Foam - Lift dressing to assess site every shift 10/22/22 2137     Consultants: None  Procedures: None  Antimicrobials: None  Code Status: Full code   Subjective: Patient with no complaints.  Denies any leg pain unless she is up and moving  Objective: Vital signs were reviewed and unremarkable. Vitals:   10/23/22 0430 10/23/22 0803  BP: (!) 127/90 131/72  Pulse: (!) 101 62  Resp: 18 16  Temp: 98 F (36.7 C) 98.4 F (36.9 C)  SpO2: 98% 99%    Intake/Output Summary (Last 24 hours) at 10/23/2022 1311 Last data filed at 10/23/2022 1046 Gross per 24 hour  Intake 840 ml  Output 800 ml  Net 40 ml    Filed Weights   10/20/22 2008 10/21/22 0127  Weight: 67.6 kg 65.9 kg   Body mass index is 23.45 kg/m.  Exam:  General: Oriented x2, no complaints HEENT: Normocephalic, atraumatic, mucous membranes are moist Cardiovascular: Regular rate and rhythm, S1-S2 Respiratory: Clear to auscultation bilaterally Abdomen: Soft, nontender, nondistended, positive bowel sounds Musculoskeletal: 1-2+ pitting edema with signs of CIDP  chronic changes Skin: As above Psychiatry: Appropriate, no evidence of psychoses Neurology: Decreased level of sensation in lower extremities due to CIDP  Data Reviewed: Creatinine of 1.27 today  Disposition:  Status is: Inpatient Remains inpatient appropriate because:  -Needs skilled nursing    Anticipated discharge date: 10/30  Family Communication: Son at the bedside DVT Prophylaxis: apixaban (ELIQUIS) tablet 2.5 mg Start: 10/21/22 1900 apixaban (ELIQUIS) tablet 2.5 mg    Author: Hollice Espy ,MD 10/23/2022 1:11 PM  To reach On-call, see care teams to locate the attending and reach out via [email or web address]. Between 7PM-7AM, please contact night-coverage If you still have difficulty reaching the attending provider, please page the [facility] (Director on Call) for [facility] on amion for assistance.

## 2022-10-23 NOTE — Care Management Important Message (Signed)
Important Message  Patient Details  Name: Linda Hess MRN: [id number] Date of Birth: [birth year]   Medicare Important Message Given:  Yes     Dannette Barbara 10/23/2022, 12:35 PM

## 2022-10-23 NOTE — Progress Notes (Signed)
Occupational Therapy Treatment Patient Details Name: Linda Hess MRN: [id number] DOB: 1931/02/09 Today's Date: 10/23/2022   History of present illness Linda Hess is a 88 y.o. female with medical history significant of type 2 diabetes, CIDP, essential hypertension, history of DVT of the left leg, mixed hyperlipidemia who presented to the ER after sustaining a fall today.  Patient has been having chronic left-sided hip pain and has not walked for weeks.  Per patient so she has been getting up moving apparently between furniture.  Was seen by orthopedics recently and told she has chronic left femoral neck fracture.  She was given pain medication but pain has persisted.  Patient was seen by Dr. Fuller Canada last week.  In the ER today she was having pain more than 10 out of 10.  No obvious fracture on CT and x-ray.  Symptoms however indicates possible new fracture.  Also swelling to bilateral legs but patient has history of chronic lymphedema.  At this point she is being admitted for pain management and reevaluation for possible new fracture.  Patient also has lumbar compression fracture especially over the L1.   OT comments  Pt seen today for OT session.  Son present for duration of session.  Pt agreeable for attempt at transfer out of bed.  Significant improvement noted today with ADLs and functional mobility as compared to last session.  Pt was able to transfer self supine to sitting EOB with supv, no use of bed rail.  Pt sat EOB with good ability to don socks/apply lotion to lower legs.  Performed stand pivot to/from [facility] with min A and RW.  Pt required max vc for encouragement to calm anxiety, sequencing cues to turn with walker, and mod vc and tactile cues for upright standing posture.  Pt successful to void in commode and was able to reposition herself for lateral lean for toilet hygiene with set up assist.  Assisted pt to transfer back to EOB to prepare for PT tx with significantly lower  anxiety with transfer back to bed.  Pt reported no pain throughout session, and acknowledged only anxiety with movement, but was feeling proud of herself for transfer today.  Left pt in room with son and PT present.  Will continue to follow in the acute setting.  D/c recommendation remains appropriate.     Recommendations for follow up therapy are one component of a multi-disciplinary discharge planning process, led by the attending physician.  Recommendations may be updated based on patient status, additional functional criteria and insurance authorization.    Follow Up Recommendations  Skilled nursing-short term rehab (<3 hours/day)    Assistance Recommended at Discharge Frequent or constant Supervision/Assistance  Patient can return home with the following  A lot of help with bathing/dressing/bathroom;Assistance with cooking/housework;Assist for transportation;A lot of help with walking and/or transfers;Help with stairs or ramp for entrance   Equipment Recommendations  Other (comment) (defer to next venue of care)    Recommendations for Other Services      Precautions / Restrictions Precautions Precautions: Fall Restrictions Weight Bearing Restrictions: No       Mobility Bed Mobility Overal bed mobility: Needs Assistance Bed Mobility: Supine to Sit     Supine to sit: Supervision, HOB elevated     General bed mobility comments: extra time, able to manage supine to sit without use of bedrail Patient Response: Anxious, Cooperative  Transfers Overall transfer level: Needs assistance Equipment used: Rolling walker (2 wheels) Transfers: Sit to/from Stand Sit to  Stand: Min assist           General transfer comment: Pt reported no pain during transfer and reported it felt good to sit EOB and to stand.     Balance Overall balance assessment: Needs assistance Sitting-balance support: Feet supported, No upper extremity supported Sitting balance-Leahy Scale: Good Sitting  balance - Comments: able to don socks EOB and apply lotion to lower legs with close supv   Standing balance support: Reliant on assistive device for balance, Bilateral upper extremity supported Standing balance-Leahy Scale: Poor Standing balance comment: min A for static standing                           ADL either performed or assessed with clinical judgement   ADL Overall ADL's : Needs assistance/impaired     Grooming: Wash/dry hands;Set up Grooming Details (indicate cue type and reason): also sat EOB to apply lotion to lower legs with set up (socks left on, pt able to reach entire lower legs to tops of socks)             Lower Body Dressing: Moderate assistance;Sit to/from stand Lower Body Dressing Details (indicate cue type and reason): Pt sat EOB to don R sock figure 4 position.  Able to manage figure 4 position to attempt adjustment of L sock, but required min A assist.  Would need mod A to hike pants d/t heavy support needed on RW in standing. Toilet Transfer: Minimal assistance;BSC/3in1;Rolling walker (2 wheels)   Toileting- Clothing Manipulation and Hygiene: Set up;Sitting/lateral lean Toileting - Clothing Manipulation Details (indicate cue type and reason): pt able to reposition self on commode to manage a lateral lean     Functional mobility during ADLs: Minimal assistance;Rolling walker (2 wheels) General ADL Comments: Performed step pivots bed<>BSC, min A, max vc for encouragement to calm anxiety, sequencing cues to turn with walker    Extremity/Trunk Assessment Upper Extremity Assessment Upper Extremity Assessment: Generalized weakness   Lower Extremity Assessment Lower Extremity Assessment: Generalized weakness        Vision Patient Visual Report: No change from baseline                Cognition Arousal/Alertness: Awake/alert Behavior During Therapy: Anxious, WFL for tasks assessed/performed Overall Cognitive Status: Within Functional  Limits for tasks assessed                                 General Comments: pt very anxious upon initial transfer to commode, less anxiety transfering off commode back to bed, with pt stating, "I'm proud of myself."                     General Comments      Pertinent Vitals/ Pain       Pain Assessment Pain Score: 0-No pain Pain Intervention(s): Limited activity within patient's tolerance, Monitored during session  Home Living                                                        Frequency  Min 2X/week        Progress Toward Goals  OT Goals(current goals can now be found in the care plan section)  Progress  towards OT goals: Progressing toward goals  Acute Rehab OT Goals Patient Stated Goal: to get better OT Goal Formulation: With patient/family Time For Goal Achievement: 11/04/22 Potential to Achieve Goals: Good  Plan Discharge plan remains appropriate                     AM-PAC OT "6 Clicks" Daily Activity     Outcome Measure   Help from another person eating meals?: None Help from another person taking care of personal grooming?: A Little Help from another person toileting, which includes using toliet, bedpan, or urinal?: A Lot Help from another person bathing (including washing, rinsing, drying)?: A Little Help from another person to put on and taking off regular upper body clothing?: None Help from another person to put on and taking off regular lower body clothing?: A Lot 6 Click Score: 18    End of Session Equipment Utilized During Treatment: Rolling walker (2 wheels);Gait belt  OT Visit Diagnosis: Unsteadiness on feet (R26.81);Muscle weakness (generalized) (M62.81);Pain   Activity Tolerance Patient tolerated treatment well;No increased pain   Patient Left in bed;Other (comment) (left sitting EOB with PT present preparing to start PT visit)   Nurse Communication Mobility status (able to urinate in  [facility]; notified RN of nearly full Purewick canister; RN acknowledged and will measure and empty)        Time: 7672-0947 OT Time Calculation (min): 25 min  Charges: OT General Charges $OT Visit: 1 Visit OT Treatments $Self Care/Home Management : 23-37 mins  Leta Speller, MS, OTR/L   Darleene Cleaver 10/23/2022, 3:34 PM

## 2022-10-23 NOTE — Progress Notes (Signed)
Left voicemail with patient experience regarding missing lower dentures for patient. Updated patient.   Fuller Mandril, RN

## 2022-10-23 NOTE — Plan of Care (Signed)
  Problem: Fluid Volume: Goal: Ability to maintain a balanced intake and output will improve Outcome: Progressing   Problem: Skin Integrity: Goal: Risk for impaired skin integrity will decrease Outcome: Progressing   Problem: Activity: Goal: Risk for activity intolerance will decrease Outcome: Progressing   Problem: Pain Managment: Goal: General experience of comfort will improve Outcome: Progressing   Problem: Safety: Goal: Ability to remain free from injury will improve Outcome: Progressing

## 2022-10-23 NOTE — Progress Notes (Signed)
Patient refused to have her vital signs taken.

## 2022-10-23 NOTE — Progress Notes (Signed)
*  PRELIMINARY RESULTS* Echocardiogram 2D Echocardiogram has been performed.  Linda Hess 10/23/2022, 10:18 AM

## 2022-10-23 NOTE — TOC Progression Note (Signed)
Transition of Care [facility]) - Progression Note    Patient Details  Name: Linda Hess MRN: [id number] Date of Birth: 03-25-1931  Transition of Care [facility]) CM/SW Contact  Beverly Sessions, RN Phone Number: 10/23/2022, 3:30 PM  Clinical Narrative:     Received return call from son [name].  They would like to accept bed at [facility] . Auth approved valid 10/28-10/31  Confirmed with Ebony Hail at [facility] that they can accept over the weekend        Expected Discharge Plan and Services                                                 Social Determinants of Health (SDOH) Interventions    Readmission Risk Interventions     No data to display

## 2022-10-24 DIAGNOSIS — G6181 Chronic inflammatory demyelinating polyneuritis: Secondary | ICD-10-CM | POA: Diagnosis not present

## 2022-10-24 DIAGNOSIS — I5033 Acute on chronic diastolic (congestive) heart failure: Secondary | ICD-10-CM | POA: Diagnosis not present

## 2022-10-24 DIAGNOSIS — L89322 Pressure ulcer of left buttock, stage 2: Secondary | ICD-10-CM | POA: Diagnosis not present

## 2022-10-24 DIAGNOSIS — R262 Difficulty in walking, not elsewhere classified: Secondary | ICD-10-CM | POA: Diagnosis not present

## 2022-10-24 LAB — GLUCOSE, CAPILLARY
Glucose-Capillary: 154 mg/dL — ABNORMAL HIGH (ref 70–99)
Glucose-Capillary: 291 mg/dL — ABNORMAL HIGH (ref 70–99)
Glucose-Capillary: 331 mg/dL — ABNORMAL HIGH (ref 70–99)
Glucose-Capillary: 184 mg/dL — ABNORMAL HIGH (ref 70–99)

## 2022-10-24 LAB — BASIC METABOLIC PANEL
Anion gap: 7 (ref 5–15)
BUN: 35 mg/dL — ABNORMAL HIGH (ref 8–23)
CO2: 29 mmol/L (ref 22–32)
Calcium: 9 mg/dL (ref 8.9–10.3)
Chloride: 105 mmol/L (ref 98–111)
Creatinine, Ser: 0.96 mg/dL (ref 0.44–1.00)
GFR, Estimated: 57 mL/min — ABNORMAL LOW (ref >60)
Glucose, Bld: 168 mg/dL — ABNORMAL HIGH (ref 70–99)
Potassium: 3.6 mmol/L (ref 3.5–5.1)
Sodium: 141 mmol/L (ref 135–145)

## 2022-10-24 NOTE — Progress Notes (Signed)
Patient refused to have her vital signs taken.

## 2022-10-24 NOTE — Progress Notes (Signed)
[facility] Progress Note  Patient: Linda Hess    GUY:[id number]  DOA: 10/20/2022    Date of Service: the patient was seen and examined on 10/24/2022  Brief hospital course: 88 year old female with past medical history of diabetes mellitus type 2, CIDP, hypertension, DVT of the left leg on Xarelto and hyperlipidemia who presented to the emergency department 10/24 after a fall.  Patient has been minimally mobile for the past few weeks secondary to chronic left-sided hip pain and had been recently told by orthopedic surgery that she had a chronic left femoral neck fracture.  No relief with pain medication.  After fall, patient had new pain.  No evidence of fracture seen on CT.  Patient brought in for further evaluation.  Assessment and Plan: Assessment and Plan: * Fall Seen by PT and OT who are recommending skilled nursing.  Acute on chronic diastolic CHF (congestive heart failure) (HCC) Mild volume overload.  Echocardiogram in 2015 notes grade 1 diastolic dysfunction.  Echo during this hospitalization notes grade 2 diastolic dysfunction and preserved ejection fraction.  Lasix x1.  BNP at 525  CIDP (chronic inflammatory demyelinating polyneuropathy) (HCC) Working on pain control.  No evidence of new fracture seen on CT.  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  Responding to pain medication.  Seems to be tolerating regimen of scheduled Robaxin as needed Oxy IR.  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  Covering with sliding scale.  Renal function overall at baseline, slightly improved today  Mixed hyperlipidemia Continue statin  Pressure injury of skin Pressure Injury 10/21/22 Buttocks Left Stage 2 -   Partial thickness loss of dermis presenting as a shallow open injury with a red, pink wound bed without slough. (Active)  10/21/22 1800  Location: Buttocks  Location Orientation: Left  Staging: Stage 2 -  Partial thickness loss of dermis presenting as a shallow open injury with a red, pink wound bed without slough.  Wound Description (Comments):   Present on Admission: Yes   Stage II decubitus ulcer of left buttock, present on admission.  Wound care   History of DVT: Continue Eliquis    Body mass index is 23.15 kg/m.    Pressure Injury 10/21/22 Buttocks Left Stage 2 -  Partial thickness loss of dermis presenting as a shallow open injury with a red, pink wound bed without slough. (Active)  10/21/22 1800  Location: Buttocks  Location Orientation: Left  Staging: Stage 2 -  Partial thickness loss of dermis presenting as a shallow open injury with a red, pink wound bed without slough.  Wound Description (Comments):   Present on Admission: Yes  Dressing Type Foam - Lift dressing to assess site every shift 10/23/22 2046     Consultants: None  Procedures: Echocardiogram noting grade 2 diastolic dysfunction  Antimicrobials: None  Code Status: Full code   Subjective: No complaints  Objective: Vital signs were reviewed and unremarkable. Vitals:   10/23/22 1535 10/24/22 0800  BP:  121/60  Pulse: 70 69  Resp:  18  Temp:  97.7 F (36.5 C)  SpO2: 100% 99%    Intake/Output Summary (Last 24 hours) at 10/24/2022 1555 Last data filed at 10/24/2022 1400 Gross per 24 hour  Intake 1200 ml  Output 1200 ml  Net 0 ml   Filed Weights   10/20/22 2008 10/21/22 0127 10/24/22 0500  Weight: 67.6 kg 65.9 kg 65 kg   Body mass index is 23.15 kg/m.  Exam: About the same from previous day General: Oriented x2, no complaints HEENT: Normocephalic, atraumatic, mucous membranes are moist Cardiovascular: Regular rate and rhythm, S1-S2 Respiratory: Clear to auscultation  bilaterally Abdomen: Soft, nontender, nondistended, positive bowel sounds Musculoskeletal: 1-2+ pitting edema with signs of CIDP chronic changes Skin: As above Psychiatry: Appropriate, no evidence of psychoses Neurology: Decreased level of sensation in lower extremities due to CIDP  Data Reviewed: Creatinine of 1.27 today  Disposition:  Status is: Inpatient Remains inpatient appropriate because:  -Needs skilled nursing    Anticipated discharge date: 10/30  Family Communication: Son at the bedside DVT Prophylaxis: apixaban (ELIQUIS) tablet 2.5 mg Start: 10/21/22 1900 apixaban (ELIQUIS) tablet 2.5 mg    Author: Annita Brod ,MD 10/24/2022 3:55 PM  To reach On-call, see care teams to locate the attending and reach out via [email or web address]. Between 7PM-7AM, please contact night-coverage If you still have difficulty reaching the attending provider, please page the [facility] (Director on Call) for [facility] on amion for assistance.

## 2022-10-25 LAB — GLUCOSE, CAPILLARY
Glucose-Capillary: 140 mg/dL — ABNORMAL HIGH (ref 70–99)
Glucose-Capillary: 205 mg/dL — ABNORMAL HIGH (ref 70–99)
Glucose-Capillary: 221 mg/dL — ABNORMAL HIGH (ref 70–99)
Glucose-Capillary: 299 mg/dL — ABNORMAL HIGH (ref 70–99)
Glucose-Capillary: 209 mg/dL — ABNORMAL HIGH (ref 70–99)

## 2022-10-25 LAB — BASIC METABOLIC PANEL
Anion gap: 8 (ref 5–15)
BUN: 32 mg/dL — ABNORMAL HIGH (ref 8–23)
CO2: 27 mmol/L (ref 22–32)
Calcium: 9.2 mg/dL (ref 8.9–10.3)
Chloride: 106 mmol/L (ref 98–111)
Creatinine, Ser: 0.94 mg/dL (ref 0.44–1.00)
GFR, Estimated: 58 mL/min — ABNORMAL LOW (ref >60)
Glucose, Bld: 150 mg/dL — ABNORMAL HIGH (ref 70–99)
Potassium: 3.9 mmol/L (ref 3.5–5.1)
Sodium: 141 mmol/L (ref 135–145)

## 2022-10-25 MED ORDER — RIVAROXABAN 10 MG PO TABS
10.0000 mg | ORAL_TABLET | Freq: Every day | ORAL | Status: DC
  Filled 2022-10-25: qty 1

## 2022-10-25 MED ORDER — HYDROCODONE-ACETAMINOPHEN 5-325 MG PO TABS: 1.0000 | ORAL_TABLET | ORAL | Status: DC | PRN

## 2022-10-25 MED ORDER — TRAMADOL HCL 50 MG PO TABS
50.0000 mg | ORAL_TABLET | Freq: Four times a day (QID) | ORAL | Status: DC | PRN
  Administered 2022-10-25: 50 mg via ORAL
  Filled 2022-10-25: qty 1

## 2022-10-25 MED ORDER — GABAPENTIN 100 MG PO CAPS
200.0000 mg | ORAL_CAPSULE | Freq: Three times a day (TID) | ORAL | Status: DC
  Administered 2022-10-25 – 2022-10-26 (×3): 200 mg via ORAL
  Filled 2022-10-25 (×3): qty 2

## 2022-10-25 MED ORDER — GABAPENTIN 100 MG PO CAPS: 200.0000 mg | ORAL_CAPSULE | Freq: Three times a day (TID) | ORAL | 0 refills | Status: DC

## 2022-10-25 MED ORDER — TRAMADOL HCL 50 MG PO TABS: 50.0000 mg | ORAL_TABLET | Freq: Three times a day (TID) | ORAL | 0 refills | Status: DC | PRN

## 2022-10-25 MED ORDER — MORPHINE SULFATE (PF) 2 MG/ML IV SOLN: 1.0000 mg | INTRAVENOUS | Status: DC | PRN

## 2022-10-25 MED ORDER — RIVAROXABAN 10 MG PO TABS: 10.0000 mg | ORAL_TABLET | Freq: Every day | ORAL | 1 refills | Status: DC

## 2022-10-25 MED ORDER — BISACODYL 10 MG RE SUPP
10.0000 mg | Freq: Once | RECTAL | Status: AC
  Administered 2022-10-25: 10 mg via RECTAL
  Filled 2022-10-25: qty 1

## 2022-10-25 MED ORDER — HYDROCODONE-ACETAMINOPHEN 5-325 MG PO TABS: 1.0000 | ORAL_TABLET | Freq: Three times a day (TID) | ORAL | 0 refills | Status: DC | PRN

## 2022-10-25 NOTE — TOC Transition Note (Addendum)
Transition of Care [facility]) - CM/SW Discharge Note   Patient Details  Name: Linda Hess MRN: [id number] Date of Birth: 12-28-1931  [facility] CM/SW Contact:  Valente David, RN Phone Number: 10/25/2022, 10:34 AM   Clinical Narrative:     Per MD, patient medically ready for discharge to [facility] rehab today.  Spoke with Ebony Hail, confirmed they are able to accept patient.  Advised bedside nurse to call report to [phone] when ready, patient will go to room 510-B.  EMS paperwork completed, will call for transport once bedside nurse is ready for discharge.  Son, Kasandra Knudsen, updated on discharge plan.    Update @ 80:  [facility] non-emergent EMS called for transport to [place], patient is 2nd on the list.  Nurse aware, hard prescriptions written and signed by MD to send with patient. No further TOC needs at this time.  Update @ 1318:  Notified by EMS that they are unable to transport patient today due to not having any convalescence trucks, only running emergency trucks today.  MD, bedside nurse, and Ebony Hail at [place] rehab all aware that discharge will be postponed to tomorrow.  Final next level of care: Skilled Nursing Facility Barriers to Discharge: Barriers Resolved   Patient Goals and CMS Choice Patient states their goals for this hospitalization and ongoing recovery are:: SNF for short term rehab CMS Medicare.gov Compare Post Acute Care list provided to:: Patient    Discharge Placement              Patient [facility] bed at: [facility] Patient to be transferred to facility by: [facility]-emergent [facility] Name of family member notified: Kasandra Knudsen (son) Patient and family notified of of transfer: 10/25/22  Discharge Plan and Services                                     Social Determinants of Health (SDOH) Interventions     Readmission Risk Interventions     No data to display

## 2022-10-25 NOTE — Discharge Summary (Signed)
Physician Discharge Summary   Patient: Linda Hess MRN: [id number] DOB: December 09, 1931  Admit date:     10/20/2022  Discharge date: 10/25/22  Discharge Physician: Enedina Finner   PCP: Smith Robert, MD   Recommendations at discharge:    F/u PCP in 1-2 weeks  Discharge Diagnoses: Principal Problem:   Fall Active Problems:   Acute on chronic diastolic CHF (congestive heart failure) (HCC)   Peripheral arterial disease (HCC)   CIDP (chronic inflammatory demyelinating polyneuropathy) (HCC)   Essential hypertension, benign   Type 2 diabetes mellitus with stage IIIb chronic kidney disease, without long-term current use of insulin (HCC)   Mixed hyperlipidemia   Pressure injury of skin   Hospital Course: 88 year old female with past medical history of diabetes mellitus type 2, CIDP, hypertension, DVT of the left leg on Xarelto and hyperlipidemia who presented to the emergency department 10/24 after a fall.  Patient has been minimally mobile for the past few weeks secondary to chronic left-sided hip pain and had been recently told by orthopedic surgery that she had a chronic left femoral neck fracture.  No relief with pain medication.  After fall, patient had new pain.  No evidence of fracture seen on CT.  Patient brought in for further evaluation.  Assessment and Plan: * Fall Seen by PT and OT who are recommending skilled nursing.  Acute on chronic diastolic CHF (congestive heart failure) (HCC) Mild volume overload.  Echocardiogram in 2015 notes grade 1 diastolic dysfunction.  Echo during this hospitalization notes grade 2 diastolic dysfunction and preserved ejection fraction.  Lasix x1.  BNP at 525 --pt appears at baseline. No resp distress. LE edema improving  CIDP (chronic inflammatory demyelinating polyneuropathy) (HCC) Working on pain control.  No evidence of new fracture seen on CT.  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  Responding to pain medication.   --prn ultram and norco--d/w pt and works well for her at home  Essential hypertension, benign Blood pressure stable during hospitalization   Type 2 diabetes mellitus with stage IIIb chronic kidney disease, without long-term current use of insulin (HCC) A1c in 2022 at 8.5, now up to 10.5.  Covering with sliding scale.  Renal function overall at baseline, slightly improved today  Mixed hyperlipidemia Continue statin  Pressure injury of skin Pressure Injury 10/21/22 Buttocks Left Stage 2 -  Partial thickness loss of dermis presenting as a shallow open injury with a red, pink wound bed without slough. (Active)  10/21/22 1800  Location: Buttocks  Location Orientation: Left  Staging: Stage 2 -  Partial thickness loss of dermis presenting as a shallow open injury with a red, pink wound bed without slough.  Wound Description (Comments):   Present on Admission: Yes   Stage II decubitus ulcer of left buttock, present on admission.  Wound care    Overall appears to be near baseline. Will d/c to [facility]. Pt and son at bedside in agreement     Pain control - [facility] Controlled Substance Reporting System database was reviewed. and patient was instructed, not to drive, operate heavy machinery, perform activities at heights, swimming or participation in water activities or provide baby-sitting services while on Pain, Sleep and Anxiety Medications; until their outpatient Physician has advised to do so again. Also recommended to not to take more than prescribed Pain, Sleep and Anxiety Medications.  Consultants: none Procedures performed: none  Disposition: Rehabilitation facility Diet recommendation:  Discharge Diet  Orders (From admission, onward)     Start     Ordered   10/25/22 0000  Diet - low sodium heart healthy        10/25/22 1025           Cardiac and Carb modified diet DISCHARGE MEDICATION: Allergies as of 10/25/2022        Reactions   Amoxicillin-pot Clavulanate Hives   Other Hives, Itching   RED MEAT   Penicillins    hives Other reaction(s): rash        Medication List     TAKE these medications    acetaminophen 325 MG tablet Commonly known as: TYLENOL Take 325 mg by mouth every 6 (six) hours as needed for mild pain.   ascorbic acid 500 MG tablet Commonly known as: VITAMIN C Take 500 mg by mouth daily.   calcium-vitamin D 500-200 MG-UNIT tablet Commonly known as: OSCAL WITH D Take 1 tablet by mouth at bedtime.   Fish Oil 1200 MG Caps Take 1 capsule by mouth every morning.   gabapentin 100 MG capsule Commonly known as: NEURONTIN Take 2 capsules (200 mg total) by mouth 3 (three) times daily. What changed: how much to take   HYDROcodone-acetaminophen 5-325 MG tablet Commonly known as: NORCO/VICODIN Take 1 tablet by mouth every 8 (eight) hours as needed.   Januvia 100 MG tablet Generic drug: sitaGLIPtin Take 100 mg by mouth daily.   metFORMIN 500 MG 24 hr tablet Commonly known as: GLUCOPHAGE-XR Take 1 tablet (500 mg total) by mouth daily with breakfast.   oxybutynin 5 MG tablet Commonly known as: DITROPAN Take 0.5 tablets by mouth 2 (two) times daily.   predniSONE 10 MG tablet Commonly known as: DELTASONE Take 1 tablet (10 mg total) by mouth daily with breakfast.   rivaroxaban 10 MG Tabs tablet Commonly known as: XARELTO Take 1 tablet (10 mg total) by mouth daily. Start taking on: October 26, 2022 What changed:  medication strength how much to take when to take this   rosuvastatin 10 MG tablet Commonly known as: CRESTOR Take 10 mg by mouth daily.   traMADol 50 MG tablet Commonly known as: ULTRAM Take 1 tablet (50 mg total) by mouth every 8 (eight) hours as needed for moderate pain.               Discharge Care Instructions  (From admission, onward)           Start     Ordered   10/25/22 0000  Discharge wound care:       Comments: Pressure Injury  10/21/22 Buttocks Left Stage 2 -  Partial thickness loss of dermis presenting as a shallow open injury with a red, pink wound bed without slough. 3 days  foam padding as needed   10/25/22 1025            Contact information for follow-up providers     Smith Robert, MD. Schedule an appointment as soon as possible for a visit in 1 week(s).   Specialty: Family Medicine Contact information: [address] [place] [address] [phone]              Contact information for after-discharge care     Destination     [facility] .   Service: Skilled Paramedic information: [address] [place] [address] [phone]  Discharge Exam: Filed Weights   10/21/22 0127 10/24/22 0500 10/25/22 0524  Weight: 65.9 kg 65 kg 65 kg     Condition at discharge: fair  The results of significant diagnostics from this hospitalization (including imaging, microbiology, ancillary and laboratory) are listed below for reference.   Imaging Studies: ECHOCARDIOGRAM COMPLETE  Result Date: 10/23/2022    ECHOCARDIOGRAM REPORT   Patient Name:   Jannett CelestineMARGARET P Librizzi Date of Exam: 10/23/2022 Medical Rec #:  [id number]        Height:       66.0 in Accession #:    [id number]       Weight:       145.3 lb Date of Birth:  1931/01/29       BSA:          1.746 m Patient Age:    89 years         BP:           131/72 mmHg Patient Gender: F                HR:           62 bpm. Exam Location:  [facility] Procedure: 2D Echo, Cardiac Doppler and Color Doppler Indications:     CHF-acute diastolic I50.31  History:         Patient has no prior history of Echocardiogram examinations.                  Risk Factors:Hypertension, Dyslipidemia and Diabetes.  Sonographer:     Cristela BlueJerry Hege Referring Phys:  [id number] SENDIL Mordecai RasmussenK KRISHNAN Diagnosing Phys: Julien Nordmannimothy Gollan MD  Sonographer Comments: Suboptimal parasternal window. IMPRESSIONS  1. Left  ventricular ejection fraction, by estimation, is 60 to 65%. The left ventricle has normal function. The left ventricle has no regional wall motion abnormalities. There is moderate concentric left ventricular hypertrophy. Left ventricular diastolic parameters are consistent with Grade II diastolic dysfunction (pseudonormalization).  2. Right ventricular systolic function is normal. The right ventricular size is normal. There is normal pulmonary artery systolic pressure. The estimated right ventricular systolic pressure is 27.8 mmHg.  3. Left atrial size was mild to moderately dilated.  4. The mitral valve is normal in structure. Mild mitral valve regurgitation. No evidence of mitral stenosis.  5. The aortic valve was not well visualized. Aortic valve regurgitation is not visualized. No aortic stenosis is present.  6. The inferior vena cava is normal in size with greater than 50% respiratory variability, suggesting right atrial pressure of 3 mmHg. FINDINGS  Left Ventricle: Left ventricular ejection fraction, by estimation, is 60 to 65%. The left ventricle has normal function. The left ventricle has no regional wall motion abnormalities. The left ventricular internal cavity size was normal in size. There is  moderate concentric left ventricular hypertrophy. Left ventricular diastolic parameters are consistent with Grade II diastolic dysfunction (pseudonormalization). Right Ventricle: The right ventricular size is normal. No increase in right ventricular wall thickness. Right ventricular systolic function is normal. There is normal pulmonary artery systolic pressure. The tricuspid regurgitant velocity is 2.39 m/s, and  with an assumed right atrial pressure of 5 mmHg, the estimated right ventricular systolic pressure is 27.8 mmHg. Left Atrium: Left atrial size was mild to moderately dilated. Right Atrium: Right atrial size was normal in size. Pericardium: There is no evidence of pericardial effusion. Mitral Valve: The  mitral valve is normal in structure. Mild mitral valve regurgitation. No evidence of mitral valve stenosis. Tricuspid Valve: The tricuspid  valve is normal in structure. Tricuspid valve regurgitation is not demonstrated. No evidence of tricuspid stenosis. Aortic Valve: The aortic valve was not well visualized. Aortic valve regurgitation is not visualized. No aortic stenosis is present. Aortic valve mean gradient measures 6.7 mmHg. Aortic valve peak gradient measures 12.3 mmHg. Aortic valve area, by VTI measures 3.20 cm. Pulmonic Valve: The pulmonic valve was normal in structure. Pulmonic valve regurgitation is not visualized. No evidence of pulmonic stenosis. Aorta: The aortic root is normal in size and structure. Venous: The inferior vena cava is normal in size with greater than 50% respiratory variability, suggesting right atrial pressure of 3 mmHg. IAS/Shunts: No atrial level shunt detected by color flow Doppler.  LEFT VENTRICLE PLAX 2D LVIDd:         4.30 cm   Diastology LVIDs:         3.10 cm   LV e' medial:    5.66 cm/s LV PW:         1.20 cm   LV E/e' medial:  13.6 LV IVS:        1.20 cm   LV e' lateral:   6.96 cm/s LVOT diam:     2.65 cm   LV E/e' lateral: 11.1 LV SV:         100 LV SV Index:   57 LVOT Area:     5.52 cm  RIGHT VENTRICLE RV Basal diam:  3.50 cm RV Mid diam:    3.00 cm LEFT ATRIUM             Index        RIGHT ATRIUM           Index LA diam:        3.60 cm 2.06 cm/m   RA Area:     14.00 cm LA Vol (A2C):   89.1 ml 51.04 ml/m  RA Volume:   29.30 ml  16.78 ml/m LA Vol (A4C):   41.6 ml 23.83 ml/m LA Biplane Vol: 63.4 ml 36.32 ml/m  AORTIC VALVE AV Area (Vmax):    2.93 cm AV Area (Vmean):   3.07 cm AV Area (VTI):     3.20 cm AV Vmax:           175.67 cm/s AV Vmean:          115.000 cm/s AV VTI:            0.312 m AV Peak Grad:      12.3 mmHg AV Mean Grad:      6.7 mmHg LVOT Vmax:         93.40 cm/s LVOT Vmean:        64.000 cm/s LVOT VTI:          0.181 m LVOT/AV VTI ratio: 0.58 MITRAL  VALVE               TRICUSPID VALVE MV Area (PHT): 2.51 cm    TR Peak grad:   22.8 mmHg MV Decel Time: 302 msec    TR Vmax:        239.00 cm/s MV E velocity: 77.00 cm/s MV A velocity: 66.60 cm/s  SHUNTS MV E/A ratio:  1.16        Systemic VTI:  0.18 m                            Systemic Diam: 2.65 cm Julien Nordmann MD Electronically signed by Julien Nordmann MD Signature  Date/Time: 10/23/2022/12:08:31 PM    Final    DG Femur Min 2 Views Left  Result Date: 10/20/2022 CLINICAL DATA:  fall, pain EXAM: LEFT FEMUR 2 VIEWS COMPARISON:  CT left hip 10/20/2022, x-ray right hip 10/20/2022. FINDINGS: Age-indeterminate comminuted left intertrochanteric fracture. No acute displaced fracture more distally. The knee is grossly unremarkable other than at least mild degenerative changes. No left hip dislocation. Partially visualized old healed left superior and inferior pubic rami fractures. Soft tissues are unremarkable. IMPRESSION: 1. No acute displaced fracture or dislocation. 2. Radiographically age-indeterminate comminuted left intertrochanteric fracture. Findings appears chronic on CT left hip 10/20/2022. Electronically Signed   By: Iven Finn M.D.   On: 10/20/2022 22:45   DG Chest 1 View  Result Date: 10/20/2022 CLINICAL DATA:  Bilateral leg swelling and recent fall, initial encounter EXAM: CHEST  1 VIEW COMPARISON:  04/02/2019 FINDINGS: Cardiac shadow is enlarged but stable. Aortic calcifications are seen. The lungs are well aerated bilaterally. No bony abnormality is noted. IMPRESSION: No acute abnormality is noted. Electronically Signed   By: Inez Catalina M.D.   On: 10/20/2022 22:40   DG Femur Min 2 Views Right  Result Date: 10/20/2022 CLINICAL DATA:  Recent fall with right leg pain and swelling, initial encounter EXAM: RIGHT FEMUR 2 VIEWS COMPARISON:  None Available. FINDINGS: Right knee prosthesis is noted. Significant dystrophic calcification is noted along the infrapatellar ligament. No acute  fracture or dislocation is noted. IMPRESSION: Chronic changes without acute abnormality. Electronically Signed   By: Inez Catalina M.D.   On: 10/20/2022 22:39   CT ABDOMEN PELVIS WO CONTRAST  Result Date: 10/20/2022 CLINICAL DATA:  Left-sided abdominal pain after fall, bilateral lower extremity edema EXAM: CT ABDOMEN AND PELVIS WITHOUT CONTRAST TECHNIQUE: Multidetector CT imaging of the abdomen and pelvis was performed following the standard protocol without IV contrast. Unenhanced CT was performed per clinician order. Lack of IV contrast limits sensitivity and specificity, especially for evaluation of abdominal/pelvic solid viscera. RADIATION DOSE REDUCTION: This exam was performed according to the departmental dose-optimization program which includes automated exposure control, adjustment of the mA and/or kV according to patient size and/or use of iterative reconstruction technique. COMPARISON:  10/09/2022 FINDINGS: Lower chest: No acute pleural or parenchymal lung disease. Hepatobiliary: Cholecystectomy. Unremarkable unenhanced appearance of the liver. Pancreas: Unremarkable unenhanced appearance. Spleen: Unremarkable unenhanced appearance. Adrenals/Urinary Tract: No urinary tract calculi or obstructive uropathy. The adrenals and bladder are unremarkable. Stomach/Bowel: No bowel obstruction or ileus. Normal appendix right lower quadrant. No bowel wall thickening or inflammatory change. Small hiatal hernia. Vascular/Lymphatic: Aortic atherosclerosis. No enlarged abdominal or pelvic lymph nodes. Reproductive: Uterus and bilateral adnexa are unremarkable. Other: No free fluid or free intraperitoneal gas. Fat containing umbilical hernia. Musculoskeletal: Mild age indeterminate compression deformity involving the superior endplate of the L1 vertebral body, with approximately 10% loss of vertebral body height. This is likely chronic given lack of visualized fracture line or paraspinal edema. No acute bony  abnormalities. Chronic healed left superior and inferior pubic rami are again identified. Stable bilateral hip osteoarthritis, with continued heterotopic ossification surrounding the left hip. Reconstructed images demonstrate no additional findings. IMPRESSION: 1. No acute intra-abdominal or intrapelvic process. 2. Age indeterminate compression deformity superior endplate L1, likely chronic. 3. Prior healed left superior and inferior pubic rami fractures unchanged. Stable bilateral hip osteoarthritis. 4. Fat containing umbilical hernia and small hiatal hernia as above. 5.  Aortic Atherosclerosis (ICD10-I70.0). Electronically Signed   By: Randa Ngo M.D.   On: 10/20/2022 22:25  CT Head Wo Contrast  Result Date: 10/20/2022 CLINICAL DATA:  Status post fall. EXAM: CT HEAD WITHOUT CONTRAST TECHNIQUE: Contiguous axial images were obtained from the base of the skull through the vertex without intravenous contrast. RADIATION DOSE REDUCTION: This exam was performed according to the departmental dose-optimization program which includes automated exposure control, adjustment of the mA and/or kV according to patient size and/or use of iterative reconstruction technique. COMPARISON:  July 11, 2008 FINDINGS: Brain: There is mild cerebral atrophy with widening of the extra-axial spaces and ventricular dilatation. There are areas of decreased attenuation within the white matter tracts of the supratentorial brain, consistent with microvascular disease changes. Vascular: There is mild to marked severity calcification of the bilateral cavernous carotid arteries. Skull: Normal. Negative for fracture or focal lesion. Sinuses/Orbits: No acute finding. Other: None. IMPRESSION: 1. No acute intracranial abnormality. 2. Generalized cerebral atrophy and microvascular disease changes of the supratentorial brain. Electronically Signed   By: Aram Candela M.D.   On: 10/20/2022 22:22   CT Hip Left Wo Contrast  Result Date:  10/20/2022 CLINICAL DATA:  Fall leg swelling. EXAM: CT OF THE LEFT HIP WITHOUT CONTRAST TECHNIQUE: Multidetector CT imaging of the left hip was performed according to the standard protocol. Multiplanar CT image reconstructions were also generated. RADIATION DOSE REDUCTION: This exam was performed according to the departmental dose-optimization program which includes automated exposure control, adjustment of the mA and/or kV according to patient size and/or use of iterative reconstruction technique. COMPARISON:  None Available. FINDINGS: Bones/Joint/Cartilage No evidence of acute fracture or dislocation. Chronic healed fracture of the left superior and inferior pubic rami. Heterotopic ossification about the anteromedial aspect of the proximal femur. Sacroiliac joints and pubic symphysis are intact. Osteopenia. Ligaments Suboptimally assessed by CT. Muscles and Tendons Muscles are normal in bulk. No intramuscular hematoma or fluid collection. Soft tissues Mild subcutaneous soft tissue edema. No fluid collection or hematoma. Partially imaged abdominal viscera are unremarkable. IMPRESSION: 1. No evidence of acute fracture or dislocation. 2. Chronic healed fracture of the left superior and inferior pubic rami. 3. Mild subcutaneous soft tissue edema without fluid collection or hematoma. Electronically Signed   By: Larose Hires D.O.   On: 10/20/2022 22:22   CT Cervical Spine Wo Contrast  Result Date: 10/20/2022 CLINICAL DATA:  Status post fall. EXAM: CT CERVICAL SPINE WITHOUT CONTRAST TECHNIQUE: Multidetector CT imaging of the cervical spine was performed without intravenous contrast. Multiplanar CT image reconstructions were also generated. RADIATION DOSE REDUCTION: This exam was performed according to the departmental dose-optimization program which includes automated exposure control, adjustment of the mA and/or kV according to patient size and/or use of iterative reconstruction technique. COMPARISON:  None  Available. FINDINGS: Alignment: There is approximately 2.5 mm retrolisthesis of the C5 vertebral body on C6. Skull base and vertebrae: No acute fracture. Degenerative changes are seen along the dorsal aspect of the anterior arch of C1. Soft tissues and spinal canal: No prevertebral fluid or swelling. No visible canal hematoma. Disc levels: Marked severity endplate sclerosis, mild anterior osteophyte formation and mild to moderate severity posterior bony spurring are seen at the levels of C5-C6 and C6-C7. There is marked severity narrowing of the anterior atlantoaxial articulation. Marked severity intervertebral disc space narrowing is seen at C5-C6 and C6-C7. Bilateral marked severity multilevel facet joint hypertrophy is noted. Upper chest: Bilateral pleural effusions are noted. Other: None. IMPRESSION: 1. No acute fracture within the cervical spine. 2. Marked severity degenerative changes at the levels of C5-C6 and C6-C7. 3.  Bilateral pleural effusions. Electronically Signed   By: Aram Candela M.D.   On: 10/20/2022 22:21   CT HIP LEFT WO CONTRAST  Result Date: 10/09/2022 CLINICAL DATA:  Left hip pain for 1 month. No known injury. Stress fracture suspected. EXAM: CT OF THE LEFT HIP WITHOUT CONTRAST TECHNIQUE: Multidetector CT imaging of the left hip was performed according to the standard protocol. Multiplanar CT image reconstructions were also generated. RADIATION DOSE REDUCTION: This exam was performed according to the departmental dose-optimization program which includes automated exposure control, adjustment of the mA and/or kV according to patient size and/or use of iterative reconstruction technique. COMPARISON:  Radiographs 07/14/2021. CT of the left femur 07/24/2021. FINDINGS: Bones/Joint/Cartilage The bones are demineralized. There is no evidence of acute fracture or dislocation. There is no cortical thickening or periosteal reaction to suggest a stress fracture. Chronic posttraumatic deformities  of the left superior and inferior pubic rami are unchanged. Mild left hip degenerative changes are present without significant joint effusion. Ligaments Suboptimally assessed by CT. Muscles and Tendons There is increasing heterotopic ossification within the soft tissues anterior to the left hip with mildly increased complex fluid within the left iliopsoas bursa. Chronic atrophy of the left gluteus musculature. The left hip musculature otherwise appears unremarkable with resolution of the previously demonstrated inflammation and ill-defined fluid in the adductor longus muscle. Soft tissues No other periarticular fluid collections or inflammatory changes are identified. The visualized internal pelvic contents appear stable. IMPRESSION: 1. No evidence of acute fracture or dislocation. 2. 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 3. Increasing heterotopic ossification within the soft tissues anterior to the left hip with mildly increased complex fluid within the left iliopsoas bursa. The other inflammatory changes previously seen within the adductor musculature of the left thigh have improved compared with the prior study. Electronically Signed   By: Carey Bullocks M.D.   On: 10/09/2022 14:07    Microbiology: Results for orders placed or performed during the hospital encounter of 07/24/21  SARS CORONAVIRUS 2 (TAT 6-24 HRS) Nasopharyngeal Nasopharyngeal Swab     Status: None   Collection Time: 07/24/21  8:59 PM   Specimen: Nasopharyngeal Swab  Result Value Ref Range Status   SARS Coronavirus 2 NEGATIVE NEGATIVE Final    Comment: (NOTE) SARS-CoV-2 target nucleic acids are NOT DETECTED.  The SARS-CoV-2 RNA is generally detectable in upper and lower respiratory specimens during the acute phase of infection. Negative results do not preclude SARS-CoV-2 infection, do not rule out co-infections with other pathogens, and should not be used as the sole basis for treatment or  other patient management decisions. Negative results must be combined with clinical observations, patient history, and epidemiological information. The expected result is Negative.  Fact Sheet for Patients: HairSlick.no  Fact Sheet for Healthcare Providers: quierodirigir.com  This test is not yet approved or cleared by the [place] FDA and  has been authorized for detection and/or diagnosis of SARS-CoV-2 by FDA under an Emergency Use Authorization (EUA). This EUA will remain  in effect (meaning this test can be used) for the duration of the COVID-19 declaration under Se ction 564(b)(1) of the Act, 21 U.S.C. section 360bbb-3(b)(1), unless the authorization is terminated or revoked sooner.  Performed at [facility], [address] [place], [place] [address]     Labs: CBC: Recent Labs  Lab 10/20/22 2008 10/21/22 0419  WBC 13.0* 11.0*  HGB 13.0 11.9*  HCT 42.0 38.2  MCV 91.1 91.8  PLT  180 144*   Basic Metabolic Panel: Recent Labs  Lab 10/21/22 0419 10/22/22 0601 10/23/22 0834 10/24/22 0425 10/25/22 0536  NA 139 140 136 141 141  K 4.0 3.9 3.9 3.6 3.9  CL 105 107 101 105 106  CO2 GLUCOSE 226* 116* 167* 168* 150*  BUN 35* 31* 32* 35* 32*  CREATININE 1.41* 1.08* 1.27* 0.96 0.94  CALCIUM 8.9 8.6* 8.9 9.0 9.2   Liver Function Tests: No results for input(s): "AST", "ALT", "ALKPHOS", "BILITOT", "PROT", "ALBUMIN" in the last 168 hours. CBG: Recent Labs  Lab 10/24/22 0747 10/24/22 1139 10/24/22 1749 10/24/22 2105 10/25/22 0745  GLUCAP 154* 291* 331* 184* 140*    Discharge time spent: greater than 30 minutes.  Signed: Enedina Finner, MD [facility] 10/25/2022

## 2022-10-25 NOTE — Progress Notes (Signed)
Discharge canceled since informed by social worker that EMS today are transporting only for emergency purposes. Patient will discharged tomorrow via EMS. Okay to keep IV out for now.

## 2022-10-26 DIAGNOSIS — R262 Difficulty in walking, not elsewhere classified: Secondary | ICD-10-CM | POA: Diagnosis not present

## 2022-10-26 DIAGNOSIS — G6181 Chronic inflammatory demyelinating polyneuritis: Secondary | ICD-10-CM | POA: Diagnosis not present

## 2022-10-26 DIAGNOSIS — I10 Essential (primary) hypertension: Secondary | ICD-10-CM | POA: Diagnosis not present

## 2022-10-26 DIAGNOSIS — I5033 Acute on chronic diastolic (congestive) heart failure: Secondary | ICD-10-CM | POA: Diagnosis not present

## 2022-10-26 LAB — GLUCOSE, CAPILLARY: Glucose-Capillary: 110 mg/dL — ABNORMAL HIGH (ref 70–99)

## 2022-10-26 LAB — CBC
HCT: 36.9 % (ref 36.0–46.0)
Hemoglobin: 11.5 g/dL — ABNORMAL LOW (ref 12.0–15.0)
MCH: 28.4 pg (ref 26.0–34.0)
MCHC: 31.2 g/dL (ref 30.0–36.0)
MCV: 91.1 fL (ref 80.0–100.0)
Platelets: 169 10*3/uL (ref 150–400)
RBC: 4.05 MIL/uL (ref 3.87–5.11)
RDW: 13.6 % (ref 11.5–15.5)
WBC: 7.1 10*3/uL (ref 4.0–10.5)
nRBC: 0 % (ref 0.0–0.2)

## 2022-10-26 NOTE — Discharge Summary (Signed)
Physician Discharge Summary   Patient: Linda Hess MRN: [id number] DOB: December 14, 1931  Admit date:     10/20/2022  Discharge date: 10/26/22  Discharge Physician: Annita Brod   PCP: Vidal Schwalbe, MD   Recommendations at discharge:    F/u PCP in 1-2 weeks New medications: Ultram 50 mg p.o. every 8 hours as needed for moderate pain Medication change: Xarelto decreased from 15 mg to 10 mg p.o. daily Patient discharged to skilled nursing New medication: Vicodin 5/325 every 8 hours as needed for pain  Discharge Diagnoses: Principal Problem:   Fall Active Problems:   Acute on chronic diastolic CHF (congestive heart failure) (HCC)   Peripheral arterial disease (HCC)   CIDP (chronic inflammatory demyelinating polyneuropathy) ([place])   Essential hypertension, benign   Type 2 diabetes mellitus with stage IIIb chronic kidney disease, without long-term current use of insulin (HCC)   Mixed hyperlipidemia   Pressure injury of skin   Hospital Course: 88 year old female with past medical history of diabetes mellitus type 2, CIDP, hypertension, DVT of the left leg on Xarelto and hyperlipidemia who presented to the emergency department 10/24 after a fall.  Patient has been minimally mobile for the past few weeks secondary to chronic left-sided hip pain and had been recently told by orthopedic surgery that she had a chronic left femoral neck fracture.  No relief with pain medication.  After fall, patient had new pain.  No evidence of fracture seen on CT.  Patient brought in for further evaluation.  Assessment and Plan: * Fall Seen by PT and OT who are recommending skilled nursing.  Acute on chronic diastolic CHF (congestive heart failure) (HCC) Mild volume overload.  Echocardiogram in 2015 notes grade 1 diastolic dysfunction.  Echo during this hospitalization notes grade 2 diastolic dysfunction and preserved ejection fraction.  Lasix x1.  BNP at 525 --pt appears at baseline. No resp  distress. LE edema improving  CIDP (chronic inflammatory demyelinating polyneuropathy) ([place]) Working on pain control.  No evidence of new fracture seen on CT.  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  Responding to pain medication.   --prn ultram and norco--d/w pt and works well for her at home  Essential hypertension, benign Blood pressure stable during hospitalization   Type 2 diabetes mellitus with stage IIIb chronic kidney disease, without long-term current use of insulin (HCC) A1c in 2022 at 8.5, now up to 10.5.  Covering with sliding scale.  Renal function overall at baseline, slightly improved today  Mixed hyperlipidemia Continue statin  Pressure injury of skin Pressure Injury 10/21/22 Buttocks Left Stage 2 -  Partial thickness loss of dermis presenting as a shallow open injury with a red, pink wound bed without slough. (Active)  10/21/22 1800  Location: Buttocks  Location Orientation: Left  Staging: Stage 2 -  Partial thickness loss of dermis presenting as a shallow open injury with a red, pink wound bed without slough.  Wound Description (Comments):   Present on Admission: Yes   Stage II decubitus ulcer of left buttock, present on admission.  Wound care    Overall appears to be near baseline. Will d/c to [facility]. Pt and son at bedside in agreement     Pain control - [place] was reviewed. and patient was instructed, not to drive, operate heavy machinery, perform activities at heights, swimming or participation in water activities or provide baby-sitting services while on Pain,  Sleep and Anxiety Medications; until their outpatient Physician has advised to do so again. Also recommended to not to take more than prescribed Pain, Sleep and Anxiety Medications.  Consultants: none Procedures performed: none  Disposition: Rehabilitation facility Diet  recommendation:  Discharge Diet Orders (From admission, onward)     Start     Ordered   10/25/22 0000  Diet - low sodium heart healthy        10/25/22 1025           Cardiac and Carb modified diet DISCHARGE MEDICATION: Allergies as of 10/26/2022       Reactions   Amoxicillin-pot Clavulanate Hives   Other Hives, Itching   RED MEAT   Penicillins    hives Other reaction(s): rash        Medication List     TAKE these medications    acetaminophen 325 MG tablet Commonly known as: TYLENOL Take 325 mg by mouth every 6 (six) hours as needed for mild pain.   ascorbic acid 500 MG tablet Commonly known as: VITAMIN C Take 500 mg by mouth daily.   calcium-vitamin D 500-200 MG-UNIT tablet Commonly known as: OSCAL WITH D Take 1 tablet by mouth at bedtime.   Fish Oil 1200 MG Caps Take 1 capsule by mouth every morning.   gabapentin 100 MG capsule Commonly known as: NEURONTIN Take 2 capsules (200 mg total) by mouth 3 (three) times daily. What changed: how much to take   HYDROcodone-acetaminophen 5-325 MG tablet Commonly known as: NORCO/VICODIN Take 1 tablet by mouth every 8 (eight) hours as needed.   Januvia 100 MG tablet Generic drug: sitaGLIPtin Take 100 mg by mouth daily.   metFORMIN 500 MG 24 hr tablet Commonly known as: GLUCOPHAGE-XR Take 1 tablet (500 mg total) by mouth daily with breakfast.   oxybutynin 5 MG tablet Commonly known as: DITROPAN Take 0.5 tablets by mouth 2 (two) times daily.   predniSONE 10 MG tablet Commonly known as: DELTASONE Take 1 tablet (10 mg total) by mouth daily with breakfast.   rivaroxaban 10 MG Tabs tablet Commonly known as: XARELTO Take 1 tablet (10 mg total) by mouth daily. What changed:  medication strength how much to take when to take this   rosuvastatin 10 MG tablet Commonly known as: CRESTOR Take 10 mg by mouth daily.   traMADol 50 MG tablet Commonly known as: ULTRAM Take 1 tablet (50 mg total) by mouth every 8  (eight) hours as needed for moderate pain.               Discharge Care Instructions  (From admission, onward)           Start     Ordered   10/25/22 0000  Discharge wound care:       Comments: Pressure Injury 10/21/22 Buttocks Left Stage 2 -  Partial thickness loss of dermis presenting as a shallow open injury with a red, pink wound bed without slough. 3 days  foam padding as needed   10/25/22 1025            Contact information for follow-up providers     Vidal Schwalbe, MD. Schedule an appointment as soon as possible for a visit in 1 week(s).   Specialty: Family Medicine Contact information: [address] [place] [address] [phone]              Contact information for after-discharge care     Destination     [facility] .   Service: Skilled Nursing Contact information: [address] [place] [phone]                    Discharge Exam: Danley Danker Weights   10/24/22 0500 10/25/22 0524 10/26/22 0337  Weight: 65 kg 65 kg 65.1 kg     Condition at discharge: fair  The results of significant diagnostics from this hospitalization (including imaging, microbiology, ancillary and laboratory) are listed below for reference.   Imaging Studies: ECHOCARDIOGRAM COMPLETE  Result Date: 10/23/2022    ECHOCARDIOGRAM REPORT   Patient Name:   Linda Hess Date of Exam: 10/23/2022 Medical Rec #:  [id number]        Height:       66.0 in Accession #:    [id number]       Weight:       145.3 lb Date of Birth:  28-May-1931       BSA:          1.746 m Patient Age:    60 years         BP:           131/72 mmHg Patient Gender: F                HR:           62 bpm. Exam Location:  [facility] Procedure: 2D Echo, Cardiac Doppler and Color Doppler Indications:     CHF-acute diastolic [id number]  History:         Patient has no prior history of Echocardiogram examinations.                  Risk  Factors:Hypertension, Dyslipidemia and Diabetes.  Sonographer:     Sherrie Sport Referring Phys:  [place] Diagnosing Phys: Ida Rogue MD  Sonographer Comments: Suboptimal parasternal window. IMPRESSIONS  1. Left ventricular ejection fraction, by estimation, is 60 to 65%. The left ventricle has normal function. The left ventricle has no regional wall motion abnormalities. There is moderate concentric left ventricular hypertrophy. Left ventricular diastolic parameters are consistent with Grade II diastolic dysfunction (pseudonormalization).  2. Right ventricular systolic function is normal. The right ventricular size is normal. There is normal pulmonary artery systolic pressure. The estimated right ventricular systolic pressure is 0000000 mmHg.  3. Left atrial size was mild to moderately dilated.  4. The mitral valve is normal in structure. Mild mitral valve regurgitation. No evidence of mitral stenosis.  5. The aortic valve was not well visualized. Aortic valve regurgitation is not visualized. No aortic stenosis is present.  6. The inferior vena cava is normal in size with greater than 50% respiratory variability, suggesting right atrial pressure of 3 mmHg. FINDINGS  Left Ventricle: Left ventricular ejection fraction, by estimation, is 60 to 65%. The left ventricle has normal function. The left ventricle has no regional wall motion abnormalities. The left ventricular internal cavity size was normal in size. There is  moderate concentric left ventricular hypertrophy. Left ventricular diastolic parameters are consistent with Grade II diastolic dysfunction (pseudonormalization). Right Ventricle: The right ventricular size is normal. No increase in right ventricular wall thickness. Right ventricular systolic function is normal. There is normal pulmonary artery systolic pressure. The tricuspid regurgitant velocity is 2.39 m/s, and  with an assumed right atrial pressure of 5 mmHg, the estimated right  ventricular systolic pressure is 0000000 mmHg. Left Atrium: Left atrial size was mild to moderately dilated. Right Atrium:  Right atrial size was normal in size. Pericardium: There is no evidence of pericardial effusion. Mitral Valve: The mitral valve is normal in structure. Mild mitral valve regurgitation. No evidence of mitral valve stenosis. Tricuspid Valve: The tricuspid valve is normal in structure. Tricuspid valve regurgitation is not demonstrated. No evidence of tricuspid stenosis. Aortic Valve: The aortic valve was not well visualized. Aortic valve regurgitation is not visualized. No aortic stenosis is present. Aortic valve mean gradient measures 6.7 mmHg. Aortic valve peak gradient measures 12.3 mmHg. Aortic valve area, by VTI measures 3.20 cm. Pulmonic Valve: The pulmonic valve was normal in structure. Pulmonic valve regurgitation is not visualized. No evidence of pulmonic stenosis. Aorta: The aortic root is normal in size and structure. Venous: The inferior vena cava is normal in size with greater than 50% respiratory variability, suggesting right atrial pressure of 3 mmHg. IAS/Shunts: No atrial level shunt detected by color flow Doppler.  LEFT VENTRICLE PLAX 2D LVIDd:         4.30 cm   Diastology LVIDs:         3.10 cm   LV e' medial:    5.66 cm/s LV PW:         1.20 cm   LV E/e' medial:  13.6 LV IVS:        1.20 cm   LV e' lateral:   6.96 cm/s LVOT diam:     2.65 cm   LV E/e' lateral: 11.1 LV SV:         100 LV SV Index:   57 LVOT Area:     5.52 cm  RIGHT VENTRICLE RV Basal diam:  3.50 cm RV Mid diam:    3.00 cm LEFT ATRIUM             Index        RIGHT ATRIUM           Index LA diam:        3.60 cm 2.06 cm/m   RA Area:     14.00 cm LA Vol (A2C):   89.1 ml 51.04 ml/m  RA Volume:   29.30 ml  16.78 ml/m LA Vol (A4C):   41.6 ml 23.83 ml/m LA Biplane Vol: 63.4 ml 36.32 ml/m  AORTIC VALVE AV Area (Vmax):    2.93 cm AV Area (Vmean):   3.07 cm AV Area (VTI):     3.20 cm AV Vmax:           175.67 cm/s  AV Vmean:          115.000 cm/s AV VTI:            0.312 m AV Peak Grad:      12.3 mmHg AV Mean Grad:      6.7 mmHg LVOT Vmax:         93.40 cm/s LVOT Vmean:        64.000 cm/s LVOT VTI:          0.181 m LVOT/AV VTI ratio: 0.58 MITRAL VALVE               TRICUSPID VALVE MV Area (PHT): 2.51 cm    TR Peak grad:   22.8 mmHg MV Decel Time: 302 msec    TR Vmax:        239.00 cm/s MV E velocity: 77.00 cm/s MV A velocity: 66.60 cm/s  SHUNTS MV E/A ratio:  1.16        Systemic VTI:  0.18 m  Systemic Diam: 2.65 cm Ida Rogue MD Electronically signed by Ida Rogue MD Signature Date/Time: 10/23/2022/12:08:31 PM    Final    DG Femur Min 2 Views Left  Result Date: 10/20/2022 CLINICAL DATA:  fall, pain EXAM: LEFT FEMUR 2 VIEWS COMPARISON:  CT left hip 10/20/2022, x-ray right hip 10/20/2022. FINDINGS: Age-indeterminate comminuted left intertrochanteric fracture. No acute displaced fracture more distally. The knee is grossly unremarkable other than at least mild degenerative changes. No left hip dislocation. Partially visualized old healed left superior and inferior pubic rami fractures. Soft tissues are unremarkable. IMPRESSION: 1. No acute displaced fracture or dislocation. 2. Radiographically age-indeterminate comminuted left intertrochanteric fracture. Findings appears chronic on CT left hip 10/20/2022. Electronically Signed   By: Iven Finn M.D.   On: 10/20/2022 22:45   DG Chest 1 View  Result Date: 10/20/2022 CLINICAL DATA:  Bilateral leg swelling and recent fall, initial encounter EXAM: CHEST  1 VIEW COMPARISON:  04/02/2019 FINDINGS: Cardiac shadow is enlarged but stable. Aortic calcifications are seen. The lungs are well aerated bilaterally. No bony abnormality is noted. IMPRESSION: No acute abnormality is noted. Electronically Signed   By: Inez Catalina M.D.   On: 10/20/2022 22:40   DG Femur Min 2 Views Right  Result Date: 10/20/2022 CLINICAL DATA:  Recent fall with  right leg pain and swelling, initial encounter EXAM: RIGHT FEMUR 2 VIEWS COMPARISON:  None Available. FINDINGS: Right knee prosthesis is noted. Significant dystrophic calcification is noted along the infrapatellar ligament. No acute fracture or dislocation is noted. IMPRESSION: Chronic changes without acute abnormality. Electronically Signed   By: Inez Catalina M.D.   On: 10/20/2022 22:39   CT ABDOMEN PELVIS WO CONTRAST  Result Date: 10/20/2022 CLINICAL DATA:  Left-sided abdominal pain after fall, bilateral lower extremity edema EXAM: CT ABDOMEN AND PELVIS WITHOUT CONTRAST TECHNIQUE: Multidetector CT imaging of the abdomen and pelvis was performed following the standard protocol without IV contrast. Unenhanced CT was performed per clinician order. Lack of IV contrast limits sensitivity and specificity, especially for evaluation of abdominal/pelvic solid viscera. RADIATION DOSE REDUCTION: This exam was performed according to the departmental dose-optimization program which includes automated exposure control, adjustment of the mA and/or kV according to patient size and/or use of iterative reconstruction technique. COMPARISON:  10/09/2022 FINDINGS: Lower chest: No acute pleural or parenchymal lung disease. Hepatobiliary: Cholecystectomy. Unremarkable unenhanced appearance of the liver. Pancreas: Unremarkable unenhanced appearance. Spleen: Unremarkable unenhanced appearance. Adrenals/Urinary Tract: No urinary tract calculi or obstructive uropathy. The adrenals and bladder are unremarkable. Stomach/Bowel: No bowel obstruction or ileus. Normal appendix right lower quadrant. No bowel wall thickening or inflammatory change. Small hiatal hernia. Vascular/Lymphatic: Aortic atherosclerosis. No enlarged abdominal or pelvic lymph nodes. Reproductive: Uterus and bilateral adnexa are unremarkable. Other: No free fluid or free intraperitoneal gas. Fat containing umbilical hernia. Musculoskeletal: Mild age indeterminate  compression deformity involving the superior endplate of the L1 vertebral body, with approximately 10% loss of vertebral body height. This is likely chronic given lack of visualized fracture line or paraspinal edema. No acute bony abnormalities. Chronic healed left superior and inferior pubic rami are again identified. Stable bilateral hip osteoarthritis, with continued heterotopic ossification surrounding the left hip. Reconstructed images demonstrate no additional findings. IMPRESSION: 1. No acute intra-abdominal or intrapelvic process. 2. Age indeterminate compression deformity superior endplate L1, likely chronic. 3. Prior healed left superior and inferior pubic rami fractures unchanged. Stable bilateral hip osteoarthritis. 4. Fat containing umbilical hernia and small hiatal hernia as above. 5.  Aortic Atherosclerosis (ICD10-I70.0). Electronically  Signed   By: Randa Ngo M.D.   On: 10/20/2022 22:25   CT Head Wo Contrast  Result Date: 10/20/2022 CLINICAL DATA:  Status post fall. EXAM: CT HEAD WITHOUT CONTRAST TECHNIQUE: Contiguous axial images were obtained from the base of the skull through the vertex without intravenous contrast. RADIATION DOSE REDUCTION: This exam was performed according to the departmental dose-optimization program which includes automated exposure control, adjustment of the mA and/or kV according to patient size and/or use of iterative reconstruction technique. COMPARISON:  July 11, 2008 FINDINGS: Brain: There is mild cerebral atrophy with widening of the extra-axial spaces and ventricular dilatation. There are areas of decreased attenuation within the white matter tracts of the supratentorial brain, consistent with microvascular disease changes. Vascular: There is mild to marked severity calcification of the bilateral cavernous carotid arteries. Skull: Normal. Negative for fracture or focal lesion. Sinuses/Orbits: No acute finding. Other: None. IMPRESSION: 1. No acute intracranial  abnormality. 2. Generalized cerebral atrophy and microvascular disease changes of the supratentorial brain. Electronically Signed   By: Virgina Norfolk M.D.   On: 10/20/2022 22:22   CT Hip Left Wo Contrast  Result Date: 10/20/2022 CLINICAL DATA:  Fall leg swelling. EXAM: CT OF THE LEFT HIP WITHOUT CONTRAST TECHNIQUE: Multidetector CT imaging of the left hip was performed according to the standard protocol. Multiplanar CT image reconstructions were also generated. RADIATION DOSE REDUCTION: This exam was performed according to the departmental dose-optimization program which includes automated exposure control, adjustment of the mA and/or kV according to patient size and/or use of iterative reconstruction technique. COMPARISON:  None Available. FINDINGS: Bones/Joint/Cartilage No evidence of acute fracture or dislocation. Chronic healed fracture of the left superior and inferior pubic rami. Heterotopic ossification about the anteromedial aspect of the proximal femur. Sacroiliac joints and pubic symphysis are intact. Osteopenia. Ligaments Suboptimally assessed by CT. Muscles and Tendons Muscles are normal in bulk. No intramuscular hematoma or fluid collection. Soft tissues Mild subcutaneous soft tissue edema. No fluid collection or hematoma. Partially imaged abdominal viscera are unremarkable. IMPRESSION: 1. No evidence of acute fracture or dislocation. 2. Chronic healed fracture of the left superior and inferior pubic rami. 3. Mild subcutaneous soft tissue edema without fluid collection or hematoma. Electronically Signed   By: Keane Police D.O.   On: 10/20/2022 22:22   CT Cervical Spine Wo Contrast  Result Date: 10/20/2022 CLINICAL DATA:  Status post fall. EXAM: CT CERVICAL SPINE WITHOUT CONTRAST TECHNIQUE: Multidetector CT imaging of the cervical spine was performed without intravenous contrast. Multiplanar CT image reconstructions were also generated. RADIATION DOSE REDUCTION: This exam was performed  according to the departmental dose-optimization program which includes automated exposure control, adjustment of the mA and/or kV according to patient size and/or use of iterative reconstruction technique. COMPARISON:  None Available. FINDINGS: Alignment: There is approximately 2.5 mm retrolisthesis of the C5 vertebral body on C6. Skull base and vertebrae: No acute fracture. Degenerative changes are seen along the dorsal aspect of the anterior arch of C1. Soft tissues and spinal canal: No prevertebral fluid or swelling. No visible canal hematoma. Disc levels: Marked severity endplate sclerosis, mild anterior osteophyte formation and mild to moderate severity posterior bony spurring are seen at the levels of C5-C6 and C6-C7. There is marked severity narrowing of the anterior atlantoaxial articulation. Marked severity intervertebral disc space narrowing is seen at C5-C6 and C6-C7. Bilateral marked severity multilevel facet joint hypertrophy is noted. Upper chest: Bilateral pleural effusions are noted. Other: None. IMPRESSION: 1. No acute fracture within the  cervical spine. 2. Marked severity degenerative changes at the levels of C5-C6 and C6-C7. 3. Bilateral pleural effusions. Electronically Signed   By: Aram Candela M.D.   On: 10/20/2022 22:21   CT HIP LEFT WO CONTRAST  Result Date: 10/09/2022 CLINICAL DATA:  Left hip pain for 1 month. No known injury. Stress fracture suspected. EXAM: CT OF THE LEFT HIP WITHOUT CONTRAST TECHNIQUE: Multidetector CT imaging of the left hip was performed according to the standard protocol. Multiplanar CT image reconstructions were also generated. RADIATION DOSE REDUCTION: This exam was performed according to the departmental dose-optimization program which includes automated exposure control, adjustment of the mA and/or kV according to patient size and/or use of iterative reconstruction technique. COMPARISON:  Radiographs 07/14/2021. CT of the left femur 07/24/2021. FINDINGS:  Bones/Joint/Cartilage The bones are demineralized. There is no evidence of acute fracture or dislocation. There is no cortical thickening or periosteal reaction to suggest a stress fracture. Chronic posttraumatic deformities of the left superior and inferior pubic rami are unchanged. Mild left hip degenerative changes are present without significant joint effusion. Ligaments Suboptimally assessed by CT. Muscles and Tendons There is increasing heterotopic ossification within the soft tissues anterior to the left hip with mildly increased complex fluid within the left iliopsoas bursa. Chronic atrophy of the left gluteus musculature. The left hip musculature otherwise appears unremarkable with resolution of the previously demonstrated inflammation and ill-defined fluid in the adductor longus muscle. Soft tissues No other periarticular fluid collections or inflammatory changes are identified. The visualized internal pelvic contents appear stable. IMPRESSION: 1. No evidence of acute fracture or dislocation. 2. 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 3. Increasing heterotopic ossification within the soft tissues anterior to the left hip with mildly increased complex fluid within the left iliopsoas bursa. The other inflammatory changes previously seen within the adductor musculature of the left thigh have improved compared with the prior study. Electronically Signed   By: Carey Bullocks M.D.   On: 10/09/2022 14:07    Microbiology: Results for orders placed or performed during the hospital encounter of 07/24/21  SARS CORONAVIRUS 2 (TAT 6-24 HRS) Nasopharyngeal Nasopharyngeal Swab     Status: None   Collection Time: 07/24/21  8:59 PM   Specimen: Nasopharyngeal Swab  Result Value Ref Range Status   SARS Coronavirus 2 NEGATIVE NEGATIVE Final    Comment: (NOTE) SARS-CoV-2 target nucleic acids are NOT DETECTED.  The SARS-CoV-2 RNA is generally detectable in upper and  lower respiratory specimens during the acute phase of infection. Negative results do not preclude SARS-CoV-2 infection, do not rule out co-infections with other pathogens, and should not be used as the sole basis for treatment or other patient management decisions. Negative results must be combined with clinical observations, patient history, and epidemiological information. The expected result is Negative.  Fact Sheet for Patients: HairSlick.no  Fact Sheet for Healthcare Providers: quierodirigir.com  This test is not yet approved or cleared by the [place] FDA and  has been authorized for detection and/or diagnosis of SARS-CoV-2 by FDA under an Emergency Use Authorization (EUA). This EUA will remain  in effect (meaning this test can be used) for the duration of the COVID-19 declaration under Se ction 564(b)(1) of the Act, 21 U.S.C. section 360bbb-3(b)(1), unless the authorization is terminated or revoked sooner.  Performed at [facility], [address] [place], [place] [address]     Labs: CBC: Recent Labs  Lab 10/20/22 2008 10/21/22 9518 10/26/22 0450  WBC 13.0* 11.0* 7.1  HGB 13.0 11.9* 11.5*  HCT 42.0 38.2 36.9  MCV 91.1 91.8 91.1  PLT 180 144* [id number]    Basic Metabolic Panel: Recent Labs  Lab 10/21/22 0419 10/22/22 0601 10/23/22 0834 10/24/22 0425 10/25/22 0536  NA 139 140 136 141 141  K 4.0 3.9 3.9 3.6 3.9  CL 105 107 101 105 106  CO2 28 27 28 29 27   GLUCOSE 226* 116* 167* 168* 150*  BUN 35* 31* 32* 35* 32*  CREATININE 1.41* 1.08* 1.27* 0.96 0.94  CALCIUM 8.9 8.6* 8.9 9.0 9.2    Liver Function Tests: No results for input(s): "AST", "ALT", "ALKPHOS", "BILITOT", "PROT", "ALBUMIN" in the last 168 hours. CBG: Recent Labs  Lab 10/25/22 1128 10/25/22 1158 10/25/22 1610 10/25/22 2127 10/26/22 0745  GLUCAP 205* 221* 299* 209* 110*     Discharge time spent: greater than 30  minutes.  Signed: Annita Brod, MD [facility] 10/26/2022

## 2022-10-26 NOTE — TOC Transition Note (Signed)
Transition of Care [facility]) - CM/SW Discharge Note   Patient Details  Name: Linda Hess MRN: [id number] Date of Birth: 02/24/1931  [facility] CM/SW Contact:  Beverly Sessions, RN Phone Number: 10/26/2022, 9:49 AM   Clinical Narrative:     Patient will DC to:[facility] Anticipated DC date:10/26/22  Family notified:Ricky Transport by: Johnanna Schneiders  Per MD patient ready for DC to . RN, patient's family, and facility notified of DC. Discharge Summary sent to facility. RN given number for report. DC packet on chart. Ambulance transport requested for patient.  TOC signing off.  Isaias Cowman [facility] [phone]  Final next level of care: Skilled Nursing Facility Barriers to Discharge: Barriers Resolved   Patient Goals and CMS Choice Patient states their goals for this hospitalization and ongoing recovery are:: SNF for short term rehab CMS Medicare.gov Compare Post Acute Care list provided to:: Patient    Discharge Placement              [facility] Patient to be transferred to facility by: [facility]-emergent [facility] Name of family member notified: Kasandra Knudsen (son) Patient and family notified of of transfer: 10/25/22  Discharge Plan and Services                                     Social Determinants of Health (SDOH) Interventions     Readmission Risk Interventions     No data to display

## 2022-12-10 ENCOUNTER — Encounter (INDEPENDENT_AMBULATORY_CARE_PROVIDER_SITE_OTHER): Payer: Medicare Other

## 2022-12-10 ENCOUNTER — Ambulatory Visit (INDEPENDENT_AMBULATORY_CARE_PROVIDER_SITE_OTHER): Payer: Medicare Other | Admitting: Vascular Surgery

## 2022-12-10 ENCOUNTER — Other Ambulatory Visit (INDEPENDENT_AMBULATORY_CARE_PROVIDER_SITE_OTHER): Payer: Self-pay | Admitting: Vascular Surgery

## 2022-12-10 DIAGNOSIS — M7989 Other specified soft tissue disorders: Secondary | ICD-10-CM

## 2022-12-10 DIAGNOSIS — M79606 Pain in leg, unspecified: Secondary | ICD-10-CM

## 2022-12-13 DIAGNOSIS — I890 Lymphedema, not elsewhere classified: Secondary | ICD-10-CM | POA: Insufficient documentation

## 2022-12-13 DIAGNOSIS — L97909 Non-pressure chronic ulcer of unspecified part of unspecified lower leg with unspecified severity: Secondary | ICD-10-CM | POA: Insufficient documentation

## 2022-12-13 NOTE — Progress Notes (Unsigned)
MRN : [id number]  Linda Hess is a 88 y.o. (1931-03-10) female who presents with chief complaint of legs hurt and swell.  History of Present Illness:   Patient is seen for evaluation of leg pain and swelling associated with new onset ulceration. The patient first noticed the swelling remotely. The swelling is associated with pain and discoloration. The pain and swelling worsens with prolonged dependency and improves with elevation. The pain is unrelated to activity.  The patient notes that in the morning the legs are better but the leg symptoms worsened throughout the course of the day. The patient has also noted a progressive worsening of the discoloration in the ankle and shin area.   The patient notes that an ulcer has developed acutely without specific trauma.  Since it occurred it has been slow to heal but it does seem to be getting better.  There is a small amount of drainage associated with the open area.  The wound is a little painful.  The patient denies claudication symptoms or rest pain symptoms.   The patient has not had any past angiography, interventions or vascular surgery.  Elevation makes the leg symptoms better, dependency makes them much worse. The patient denies any recent changes in medications.  The patient has been wearing graduated compression on and off.  The patient denies a history of DVT or PE. There is no prior history of phlebitis. There is no history of primary lymphedema.  No history of malignancies. No history of trauma or groin or pelvic surgery. There is no history of radiation treatment to the groin or pelvis   ABI's Rt=0.92 (triphasic) and Lt=0.90 (triphasic)    No outpatient medications have been marked as taking for the 12/14/22 encounter (Appointment) with Gilda Crease, Latina Craver, MD.    Past Medical History:  Diagnosis Date   CIDP (chronic inflammatory demyelinating polyneuropathy) (HCC)    Essential hypertension, benign    pt  denies   History of DVT (deep vein thrombosis)    Reportedly left leg, details not clear   Mixed hyperlipidemia    Type 2 diabetes mellitus (HCC)     Past Surgical History:  Procedure Laterality Date   CHOLECYSTECTOMY     Left quadriceps muscle biopsy  March 2010   Right knee replacement  September 2014    Social History Social History   Tobacco Use   Smoking status: Never   Smokeless tobacco: Never  Vaping Use   Vaping Use: Never used  Substance Use Topics   Alcohol use: No   Drug use: No    Family History Family History  Problem Relation Age of Onset   Heart attack Father    Stroke Mother    Cancer Brother     Allergies  Allergen Reactions   Amoxicillin-Pot Clavulanate Hives   Other Hives and Itching    RED MEAT   Penicillins     hives Other reaction(s): rash     REVIEW OF SYSTEMS (Negative unless checked)  Constitutional: [] Weight loss  [] Fever  [] Chills Cardiac: [] Chest pain   [] Chest pressure   [] Palpitations   [] Shortness of breath when laying flat   [] Shortness of breath with exertion. Vascular:  [] Pain in legs with walking   [x] Pain in legs at rest  [] History of DVT   [] Phlebitis   [x] Swelling in legs   [] Varicose veins   [] Non-healing ulcers Pulmonary:   [] Uses home oxygen   [] Productive cough   [] Hemoptysis   [] Wheeze  [] COPD   []   Asthma Neurologic:  [] Dizziness   [] Seizures   [] History of stroke   [] History of TIA  [] Aphasia   [] Vissual changes   [] Weakness or numbness in arm   [] Weakness or numbness in leg Musculoskeletal:   [] Joint swelling   [] Joint pain   [] Low back pain Hematologic:  [] Easy bruising  [] Easy bleeding   [] Hypercoagulable state   [] Anemic Gastrointestinal:  [] Diarrhea   [] Vomiting  [] Gastroesophageal reflux/heartburn   [] Difficulty swallowing. Genitourinary:  [] Chronic kidney disease   [] Difficult urination  [] Frequent urination   [] Blood in urine Skin:  [] Rashes   [] Ulcers  Psychological:  [] History of anxiety   []  History of  major depression.  Physical Examination  There were no vitals filed for this visit. There is no height or weight on file to calculate BMI. Gen: WD/WN, NAD Head: [place]/AT, No temporalis wasting.  Ear/Nose/Throat: Hearing grossly intact, nares w/o erythema or drainage, pinna without lesions Eyes: PER, EOMI, sclera nonicteric.  Neck: Supple, no gross masses.  No JVD.  Pulmonary:  Good air movement, no audible wheezing, no use of accessory muscles.  Cardiac: RRR, precordium not hyperdynamic. Vascular:  scattered varicosities present bilaterally.  Severe venous stasis changes to the left foot and ankle.  2-3+ soft pitting edema  Vessel Right Left  Radial Palpable Palpable  PT Not Palpable Not Palpable  DP Not Palpable Not Palpable  Gastrointestinal: soft, non-distended. No guarding/no peritoneal signs.  Musculoskeletal: M/S 5/5 throughout.  No deformity.  Neurologic: CN 2-12 intact. Pain and light touch intact in extremities.  Symmetrical.  Speech is fluent. Motor exam as listed above. Psychiatric: Judgment intact, Mood & affect appropriate for pt's clinical situation. Dermatologic: Venous rashes no ulcers noted.  No changes consistent with cellulitis. Lymph : No lichenification or skin changes of chronic lymphedema.  CBC Lab Results  Component Value Date   WBC 7.1 10/26/2022   HGB 11.5 (L) 10/26/2022   HCT 36.9 10/26/2022   MCV 91.1 10/26/2022   PLT 169 10/26/2022    BMET    Component Value Date/Time   NA 141 10/25/2022 0536   NA 139 08/30/2013 0508   K 3.9 10/25/2022 0536   K 4.3 08/31/2013 0443   CL 106 10/25/2022 0536   CL 107 08/30/2013 0508   CO2 27 10/25/2022 0536   CO2 26 08/30/2013 0508   GLUCOSE 150 (H) 10/25/2022 0536   GLUCOSE 164 (H) 08/30/2013 0508   BUN 32 (H) 10/25/2022 0536   BUN 36 (A) 04/07/2021 0000   BUN 27 (H) 08/30/2013 0508   CREATININE 0.94 10/25/2022 0536   CREATININE 1.34 (H) 04/28/2016 0925   CALCIUM 9.2 10/25/2022 0536   CALCIUM 9.1  08/30/2013 0508   GFRNONAA 58 (L) 10/25/2022 0536   GFRNONAA 52 (L) 08/30/2013 0508   GFRAA 40 04/07/2021 0000   GFRAA >60 08/30/2013 0508   CrCl cannot be calculated (Patient's most recent lab result is older than the maximum 21 days allowed.).  COAG Lab Results  Component Value Date   INR 1.3 (H) 07/24/2021   INR 0.8 08/22/2013    Radiology No results found.   Assessment/Plan 1. Venous ulcer (HCC) No surgery or intervention at this point in time.    I have had a long discussion with the patient regarding venous insufficiency and venous ulcers and why they cause symptoms and are tough to heal. I have discussed with the patient the chronic skin changes that accompany venous insufficiency and the long term sequela such as infection and recurring  ulceration.  Patient was offered an Radio broadcast assistant or an ulcer care system or continued wound care.  She feels the ulcer is healing and wishes to continue wound care.   In addition, behavioral modification including several periods of elevation of the lower extremities during the day will be continued. Achieving a position with the ankles at heart level was stressed to the patient  The patient is instructed to exercise, especially walking on a daily basis  In the future the patient can be assessed for graduated compression wraps and a Lymph Pump once the ulcers are healed.  - VAS [place] LOWER EXTREMITY VENOUS REFLUX; Future  2. Lymphedema No surgery or intervention at this point in time.    I have had a long discussion with the patient regarding venous insufficiency and venous ulcers and why they cause symptoms and are tough to heal. I have discussed with the patient the chronic skin changes that accompany venous insufficiency and the long term sequela such as infection and recurring  ulceration.  Patient was offered an Radio broadcast assistant or an ulcer care system or continued wound care.  She feels the ulcer is healing and wishes to continue wound care.    In addition, behavioral modification including several periods of elevation of the lower extremities during the day will be continued. Achieving a position with the ankles at heart level was stressed to the patient  The patient is instructed to exercise, especially walking on a daily basis  In the future the patient can be assessed for graduated compression wraps and a Lymph Pump once the ulcers are healed. - VAS [place] LOWER EXTREMITY VENOUS REFLUX; Future  3. Essential hypertension, benign Continue antihypertensive medications as already ordered, these medications have been reviewed and there are no changes at this time.  4. Type 2 diabetes mellitus with chronic kidney disease, without long-term current use of insulin, unspecified CKD stage (HCC) Continue hypoglycemic medications as already ordered, these medications have been reviewed and there are no changes at this time.  Hgb A1C to be monitored as already arranged by primary service  5. Pain of lower extremity, unspecified laterality See #1&2 - VAS [place] ABI WITH/WO TBI  6. Leg swelling See #1&2 - VAS [place] ABI WITH/WO TBI     Levora Dredge, MD  12/13/2022 2:28 PM

## 2022-12-14 ENCOUNTER — Ambulatory Visit (INDEPENDENT_AMBULATORY_CARE_PROVIDER_SITE_OTHER): Payer: Medicare Other

## 2022-12-14 ENCOUNTER — Encounter (INDEPENDENT_AMBULATORY_CARE_PROVIDER_SITE_OTHER): Payer: Self-pay | Admitting: Vascular Surgery

## 2022-12-14 ENCOUNTER — Ambulatory Visit (INDEPENDENT_AMBULATORY_CARE_PROVIDER_SITE_OTHER): Payer: Medicare Other | Admitting: Vascular Surgery

## 2022-12-14 VITALS — Ht 66.0 in | Wt 140.0 lb

## 2022-12-14 DIAGNOSIS — I890 Lymphedema, not elsewhere classified: Secondary | ICD-10-CM | POA: Diagnosis not present

## 2022-12-14 DIAGNOSIS — M7989 Other specified soft tissue disorders: Secondary | ICD-10-CM

## 2022-12-14 DIAGNOSIS — I83009 Varicose veins of unspecified lower extremity with ulcer of unspecified site: Secondary | ICD-10-CM | POA: Diagnosis not present

## 2022-12-14 DIAGNOSIS — M79606 Pain in leg, unspecified: Secondary | ICD-10-CM

## 2022-12-14 DIAGNOSIS — L97909 Non-pressure chronic ulcer of unspecified part of unspecified lower leg with unspecified severity: Secondary | ICD-10-CM

## 2022-12-14 DIAGNOSIS — I10 Essential (primary) hypertension: Secondary | ICD-10-CM

## 2022-12-14 DIAGNOSIS — E1122 Type 2 diabetes mellitus with diabetic chronic kidney disease: Secondary | ICD-10-CM

## 2022-12-15 ENCOUNTER — Encounter (INDEPENDENT_AMBULATORY_CARE_PROVIDER_SITE_OTHER): Payer: Self-pay | Admitting: Vascular Surgery

## 2022-12-22 NOTE — Progress Notes (Signed)
MRN : [id number]  Linda Hess is a 88 y.o. (1931-01-18) female who presents with chief complaint of legs hurt and swell.  History of Present Illness:   The patient returns to the office for followup evaluation regarding leg swelling.  The swelling has persisted and the pain associated with swelling continues. There have not been any interval development of new ulcerations or wounds.  Since the previous visit the patient has not really been wearing graduated compression stockings and there has been little if any improvement in the ulcer.  Also she is been sitting in wheelchair which she is able to use to scoot around the house for large portions of the day rather than elevating.  Previous ABI's Rt=0.92 (triphasic) and Lt=0.90 (triphasic)   No outpatient medications have been marked as taking for the 12/31/22 encounter (Appointment) with Linda Hess, Linda Lory, MD.    Past Medical History:  Diagnosis Date   CIDP (chronic inflammatory demyelinating polyneuropathy) ([place])    Essential hypertension, benign    pt denies   History of DVT (deep vein thrombosis)    Reportedly left leg, details not clear   Mixed hyperlipidemia    Type 2 diabetes mellitus ([place])     Past Surgical History:  Procedure Laterality Date   CHOLECYSTECTOMY     Left quadriceps muscle biopsy  March 2010   Right knee replacement  September 2014    Social History Social History   Tobacco Use   Smoking status: Never   Smokeless tobacco: Never  Vaping Use   Vaping Use: Never used  Substance Use Topics   Alcohol use: No   Drug use: No    Family History Family History  Problem Relation Age of Onset   Heart attack Father    Stroke Mother    Cancer Brother     Allergies  Allergen Reactions   Amoxicillin-Pot Clavulanate Hives   Other Hives and Itching    RED MEAT   Penicillins     hives Other reaction(s): rash     REVIEW OF SYSTEMS (Negative unless checked)  Constitutional: [] Weight  loss  [] Fever  [] Chills Cardiac: [] Chest pain   [] Chest pressure   [] Palpitations   [] Shortness of breath when laying flat   [] Shortness of breath with exertion. Vascular:  [] Pain in legs with walking   [x] Pain in legs at rest  [] History of DVT   [] Phlebitis   [x] Swelling in legs   [] Varicose veins   [] Non-healing ulcers Pulmonary:   [] Uses home oxygen   [] Productive cough   [] Hemoptysis   [] Wheeze  [] COPD   [] Asthma Neurologic:  [] Dizziness   [] Seizures   [] History of stroke   [] History of TIA  [] Aphasia   [] Vissual changes   [] Weakness or numbness in arm   [] Weakness or numbness in leg Musculoskeletal:   [] Joint swelling   [] Joint pain   [] Low back pain Hematologic:  [] Easy bruising  [] Easy bleeding   [] Hypercoagulable state   [] Anemic Gastrointestinal:  [] Diarrhea   [] Vomiting  [] Gastroesophageal reflux/heartburn   [] Difficulty swallowing. Genitourinary:  [] Chronic kidney disease   [] Difficult urination  [] Frequent urination   [] Blood in urine Skin:  [] Rashes   [] Ulcers  Psychological:  [] History of anxiety   []  History of major depression.  Physical Examination  There were no vitals filed for this visit. There is no height or weight on file to calculate BMI. Gen: WD/WN, NAD Head: [place]/AT, No temporalis wasting.  Ear/Nose/Throat:  Hearing grossly intact, nares w/o erythema or drainage, pinna without lesions Eyes: PER, EOMI, sclera nonicteric.  Neck: Supple, no gross masses.  No JVD.  Pulmonary:  Good air movement, no audible wheezing, no use of accessory muscles.  Cardiac: RRR, precordium not hyperdynamic. Vascular:  scattered varicosities present bilaterally.  Moderate venous stasis changes to the legs bilaterally with marked dependent cyanosis of the left foot and ankle.  3+ soft pitting edema which is worse compared to the previous visit.  Ulcer is present on the foot approximately 4 to 5 mm circular and uninfected Vessel Right Left  Radial Palpable Palpable  Gastrointestinal: soft,  non-distended. No guarding/no peritoneal signs.  Musculoskeletal: M/S 5/5 throughout.  No deformity.  Neurologic: CN 2-12 intact. Pain and light touch intact in extremities.  Symmetrical.  Speech is fluent. Motor exam as listed above. Psychiatric: Judgment intact, Mood & affect appropriate for pt's clinical situation. Dermatologic: Venous rashes no ulcers noted.  No changes consistent with cellulitis. Lymph : No lichenification or skin changes of chronic lymphedema.  CBC Lab Results  Component Value Date   WBC 7.1 10/26/2022   HGB 11.5 (L) 10/26/2022   HCT 36.9 10/26/2022   MCV 91.1 10/26/2022   PLT 169 10/26/2022    BMET    Component Value Date/Time   NA 141 10/25/2022 0536   NA 139 08/30/2013 0508   K 3.9 10/25/2022 0536   K 4.3 08/31/2013 0443   CL 106 10/25/2022 0536   CL 107 08/30/2013 0508   CO2 27 10/25/2022 0536   CO2 26 08/30/2013 0508   GLUCOSE 150 (H) 10/25/2022 0536   GLUCOSE 164 (H) 08/30/2013 0508   BUN 32 (H) 10/25/2022 0536   BUN 36 (A) 04/07/2021 0000   BUN 27 (H) 08/30/2013 0508   CREATININE 0.94 10/25/2022 0536   CREATININE 1.34 (H) 04/28/2016 0925   CALCIUM 9.2 10/25/2022 0536   CALCIUM 9.1 08/30/2013 0508   GFRNONAA 58 (L) 10/25/2022 0536   GFRNONAA 52 (L) 08/30/2013 0508   GFRAA 40 04/07/2021 0000   GFRAA >60 08/30/2013 0508   CrCl cannot be calculated (Patient's most recent lab result is older than the maximum 21 days allowed.).  COAG Lab Results  Component Value Date   INR 1.3 (H) 07/24/2021   INR 0.8 08/22/2013    Radiology VAS [place] ABI WITH/WO TBI  Result Date: 12/14/2022  LOWER EXTREMITY DOPPLER STUDY Patient Name:  Linda Hess  Date of Exam:   12/14/2022 Medical Rec #: [id number]         Accession #:    [id number] Date of Birth: Jul 23, 1931        Patient Gender: F Patient Age:   40 years Exam Location:  [facility] Procedure:      VAS [place] ABI WITH/WO TBI Referring Phys: [facility]  --------------------------------------------------------------------------------  Indications: Rest pain. High Risk Factors: Hyperlipidemia, Diabetes, no history of smoking. Other Factors: Small wound left foot.  Performing Technologist: Delorise Shiner RVT  Examination Guidelines: A complete evaluation includes at minimum, Doppler waveform signals and systolic blood pressure reading at the level of bilateral brachial, anterior tibial, and posterior tibial arteries, when vessel segments are accessible. Bilateral testing is considered an integral part of a complete examination. Photoelectric Plethysmograph (PPG) waveforms and toe systolic pressure readings are included as required and additional duplex testing as needed. Limited examinations for reoccurring indications may be performed as noted.  ABI Findings: +---------+------------------+-----+---------+--------+ Right    Rt Pressure (mmHg)IndexWaveform Comment  +---------+------------------+-----+---------+--------+  Brachial 162                                      +---------+------------------+-----+---------+--------+ PTA      148               0.90 triphasic         +---------+------------------+-----+---------+--------+ DP       151               0.92 triphasic         +---------+------------------+-----+---------+--------+ Great Toe136               0.82                   +---------+------------------+-----+---------+--------+ +---------+------------------+-----+---------+-------+ Left     Lt Pressure (mmHg)IndexWaveform Comment +---------+------------------+-----+---------+-------+ Brachial 165                                     +---------+------------------+-----+---------+-------+ PTA      148               0.90 triphasic        +---------+------------------+-----+---------+-------+ DP       133               0.81 triphasic        +---------+------------------+-----+---------+-------+ Great Toe133                0.81                  +---------+------------------+-----+---------+-------+ +-------+-----------+-----------+------------+------------+ ABI/TBIToday's ABIToday's TBIPrevious ABIPrevious TBI +-------+-----------+-----------+------------+------------+ Right  0.92       0.82                                +-------+-----------+-----------+------------+------------+ Left   0.90       0.81                                +-------+-----------+-----------+------------+------------+  Summary: Right: Resting right ankle-brachial index indicates mild right lower extremity arterial disease. The right toe-brachial index is normal. Left: Resting left ankle-brachial index indicates mild left lower extremity arterial disease. The left toe-brachial index is normal. *See table(s) above for measurements and observations.  Electronically signed by Levora Dredge MD on 12/14/2022 at 4:31:54 PM.    Final      Assessment/Plan 1. Lymphedema No surgery or intervention at this point in time.     I have reviewed my previous discussion with the patient regarding venous insufficiency, swelling and venous ulcers and why they cause symptoms and are tough to heal. I have discussed with the patient the chronic skin changes that accompany venous insufficiency and the long term sequela such as infection and recurring  ulceration.  Patient was again offered an [employer] or continued wound care with compression.  She feels the ulcer is healing and wishes to continue wound care.  I stressed to her that this requires her to wear her graduated compression sock and to take more time throughout the course of the day to elevate her foot.   In addition, behavioral modification including several periods of elevation of the lower extremities during the day will be continued. Achieving a position with the ankles  at heart level was stressed to the patient   In the future the patient can be assessed for graduated  compression wraps and a Lymph Pump once the ulcers are healed.  She will follow-up with me in 1 month and if the situation has not demonstrated significant improvement I have told her that she will have to have an Haematologist.  2. Essential hypertension, benign Continue antihypertensive medications as already ordered, these medications have been reviewed and there are no changes at this time.  3. Type 2 diabetes mellitus with chronic kidney disease, without long-term current use of insulin, unspecified CKD stage ([place]) Continue hypoglycemic medications as already ordered, these medications have been reviewed and there are no changes at this time.  Hgb A1C to be monitored as already arranged by primary service  4. Lumbar spondylosis Continue NSAID medications as already ordered, these medications have been reviewed and there are no changes at this time.  Continued activity and therapy was stressed.  5. Mixed hyperlipidemia Continue statin as ordered and reviewed, no changes at this time    Hortencia Pilar, MD  12/22/2022 4:13 PM

## 2022-12-31 ENCOUNTER — Encounter (INDEPENDENT_AMBULATORY_CARE_PROVIDER_SITE_OTHER): Payer: Self-pay | Admitting: Vascular Surgery

## 2022-12-31 ENCOUNTER — Ambulatory Visit (INDEPENDENT_AMBULATORY_CARE_PROVIDER_SITE_OTHER): Payer: Medicare Other | Admitting: Vascular Surgery

## 2022-12-31 VITALS — BP 121/66 | HR 69 | Resp 20 | Wt 140.0 lb

## 2022-12-31 DIAGNOSIS — M47816 Spondylosis without myelopathy or radiculopathy, lumbar region: Secondary | ICD-10-CM

## 2022-12-31 DIAGNOSIS — E1122 Type 2 diabetes mellitus with diabetic chronic kidney disease: Secondary | ICD-10-CM | POA: Diagnosis not present

## 2022-12-31 DIAGNOSIS — I10 Essential (primary) hypertension: Secondary | ICD-10-CM

## 2022-12-31 DIAGNOSIS — I890 Lymphedema, not elsewhere classified: Secondary | ICD-10-CM | POA: Diagnosis not present

## 2022-12-31 DIAGNOSIS — E782 Mixed hyperlipidemia: Secondary | ICD-10-CM

## 2022-12-31 IMAGING — CT CT FEMUR *L* W/ CM
2 of 3 series · 10 of 33 positions shown, 12 images · IV contrast (omnipaque)
Comparison: Same day ultrasound, left hip MRI 04/26/2019

CLINICAL DATA: Soft tissue infection suspected, hip, xray done

EXAM:
CT OF THE LOWER RIGHT EXTREMITY WITH CONTRAST
TECHNIQUE: Multidetector CT imaging of the lower right extremity including the
left femur, left tibia and fibula, and left foot, was performed
according to the standard protocol following intravenous contrast
administration.
CONTRAST:  75mL OMNIPAQUE IOHEXOL 300 MG/ML  SOLN

[Series 5: axial st · axial · 0.64mm/px · z∈[+2,+934]mm · 7 of 552 slices shown, 9 images]
[im 43/552  soft-tissue]
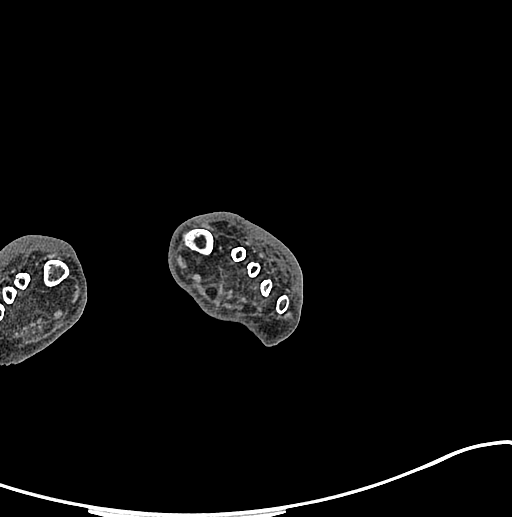
[im 43/552  bone]
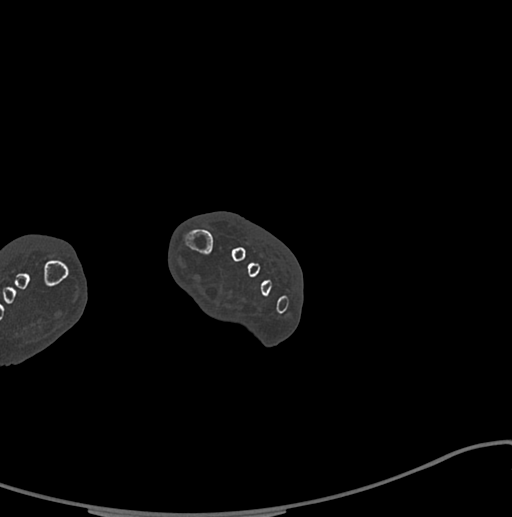
[im 128/552  bone]
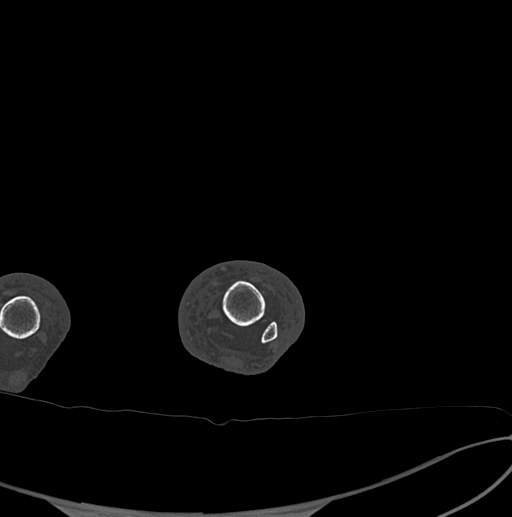
[im 212/552  bone]
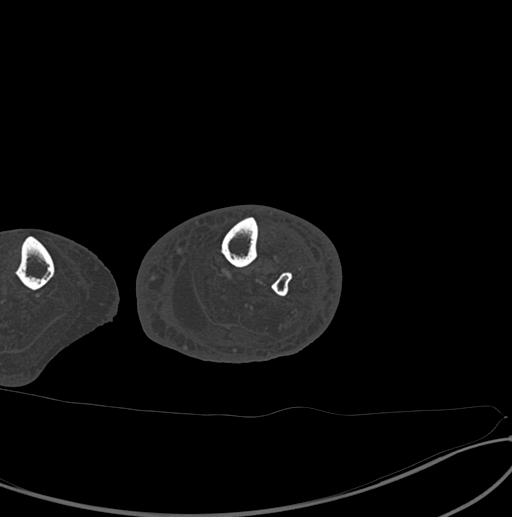
[im 297/552  bone]
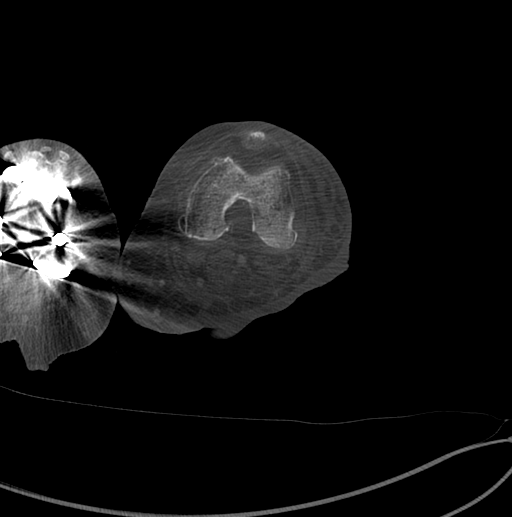
[im 340/552  soft-tissue]
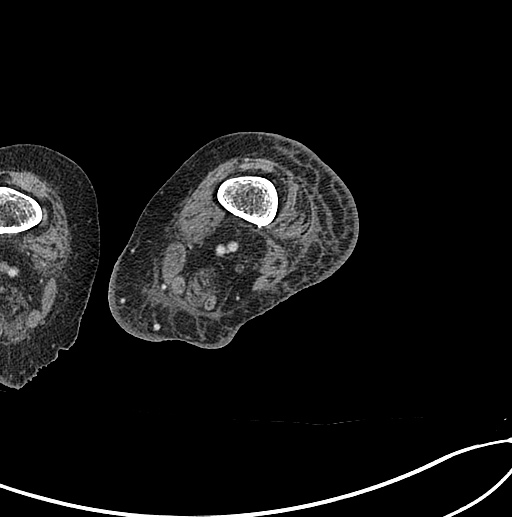
[im 340/552  bone]
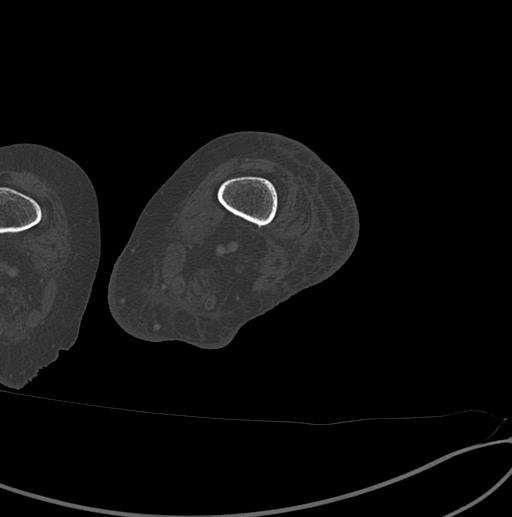
[im 424/552  bone]
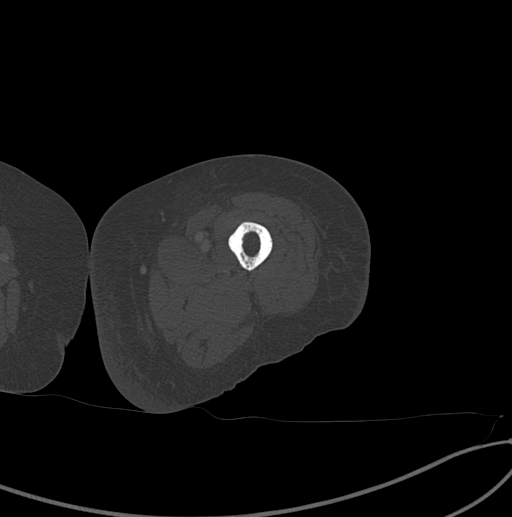
[im 509/552  bone]
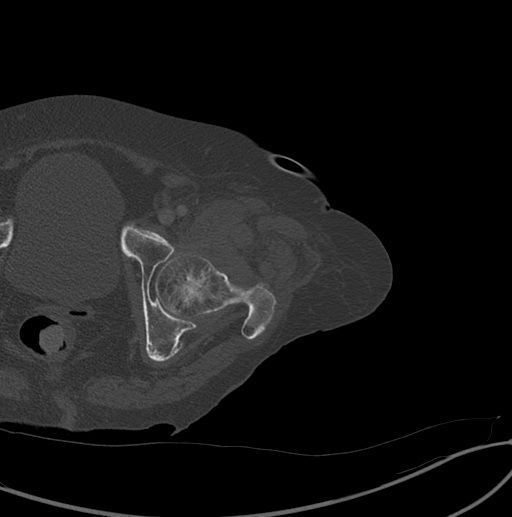

[Series 8: cor st · coronal · 0.72mm/px · 3 of 142 slices shown]
[im 29/142  bone]
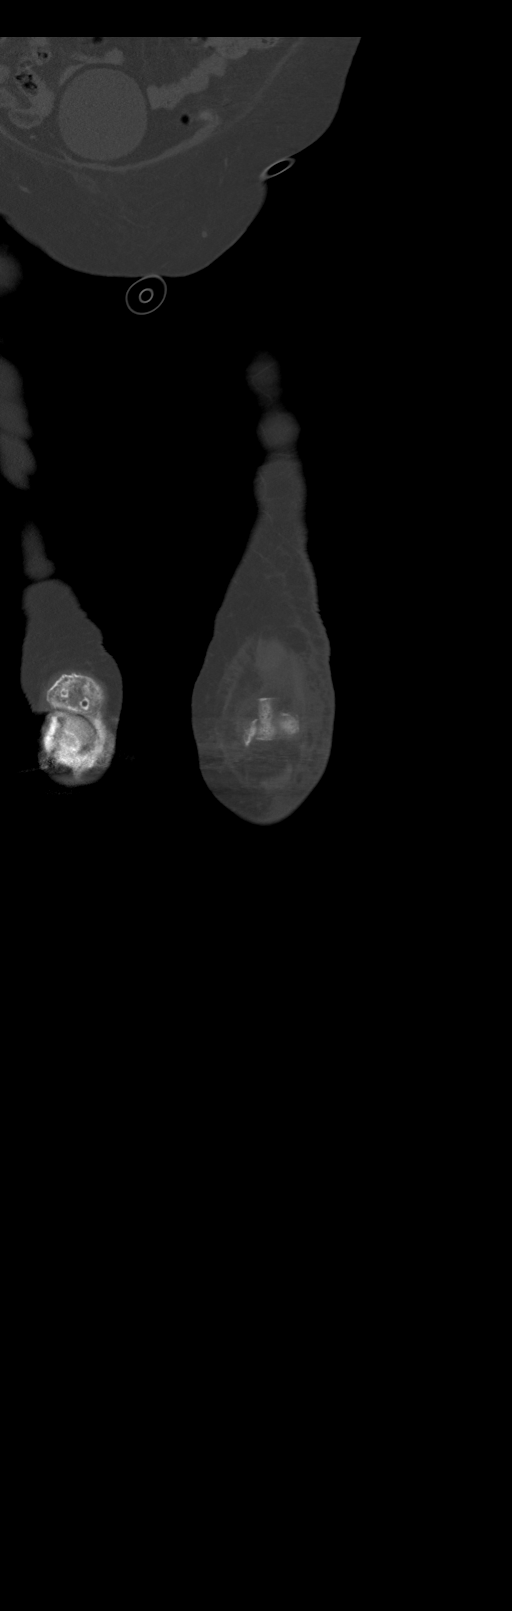
[im 57/142  bone]
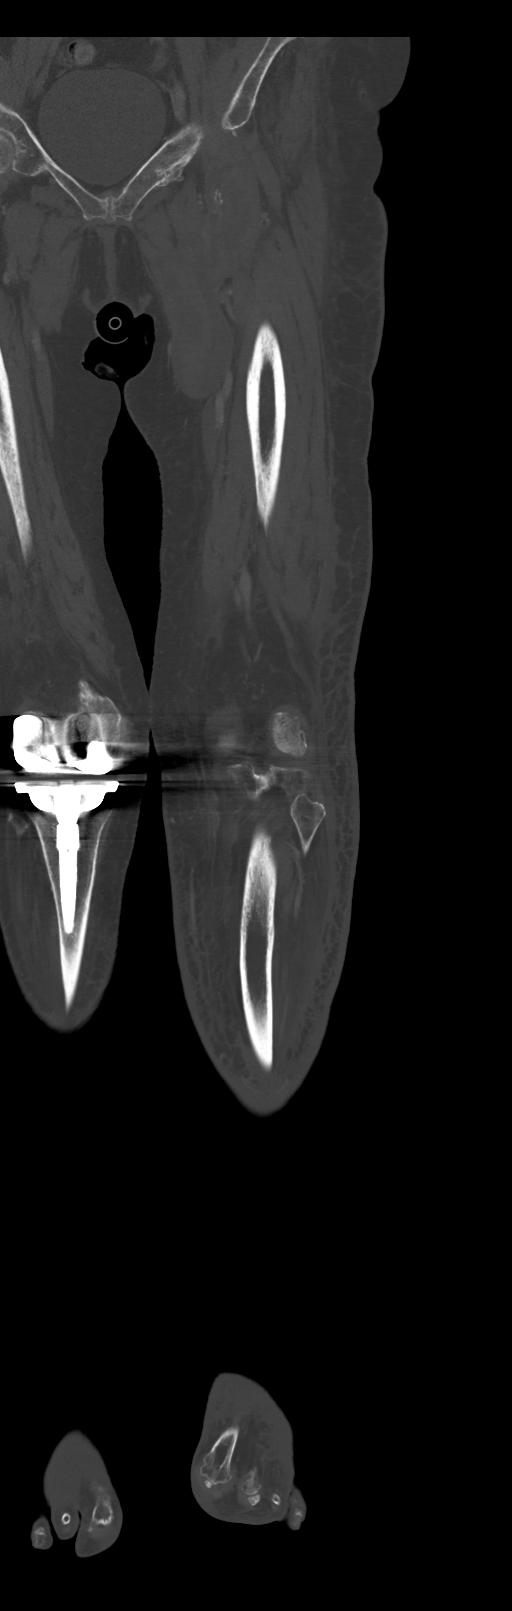
[im 85/142  bone]
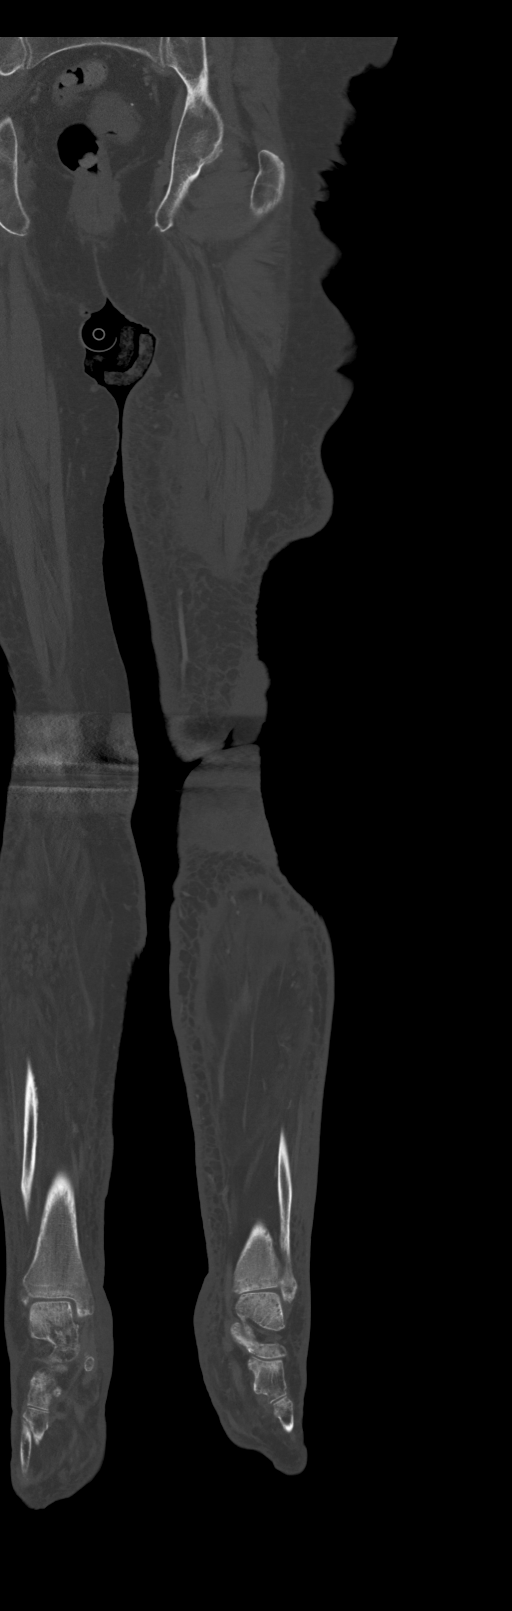

[10 of 33 positions shown; findings below may reference images not displayed]

FINDINGS: Bones/Joint/Cartilage

There are chronic fractures of the left superior and inferior pubic
rami. There is no evidence of left femur fracture. There is moderate
left hip osteoarthritis.

There is no evidence of acute fracture.

There is tricompartment knee arthritis, with moderate-severe medial
compartment joint space narrowing and trace joint effusion.

There is moderate tibiotalar osteoarthritis and degenerative changes
throughout the foot.

Diffuse osteopenia. There is no osseous destruction or periostitis
to suggest osteomyelitis.

Partially visualized right knee arthroplasty hardware.

Muscles and Tendons

There is a heterogeneous collection along the iliopsoas muscle of
the left hip, with heterotopic ossification, measuring up to 5.0 x
3.3 cm in the axial dimension. There is also a heterogeneous
collection extending within the abductor longus muscle measuring up
to 4.2 x 3.4 cm in the axial dimension.

There is moderate-severe atrophy of the lower leg musculature, and a
intramuscular lipoma involving the medial head gastrocnemius
measuring 4.1 x 1.7 cm.

Soft tissues

There is a coarse calcification along the lateral soft tissues at
the level of the distal femur, which is favored to represent sequela
of prior chronic hematoma.

Massive localized lymphedema of the upper inner thigh.

There is extensive soft tissue swelling of the left lower extremity
with skin thickening, relatively sparing the anterior thigh. There
is irregular skin thickening with subcutaneous water density
collection which extends along the skin surface of the posterior
distal thigh and posteriorly along the knee.

There is diffuse skin thickening and soft tissue swelling of the
lower leg as well extending through the ankle and foot.

There is no soft tissue gas. Partially visualized low right lower
extremity edema.

Partially visualized pelvic structures are unremarkable.
IMPRESSION: Heterogeneous collections along the distal iliopsoas muscle and
abductor longus muscles measuring up to 5.0 and 4.2 cm short axis
respectively, extending along the length of the muscles. These are
favored to represent hematomas, however infection cannot be
completely excluded.

Extensive skin thickening and subcutaneous soft tissue swelling
involving the thigh, lower leg, ankle and foot as can be seen in
lymphedema or cellulitis. No evidence of osteomyelitis. No deep
intramuscular compartment involvement of the lower leg.

Massive localized lymphedema along the upper inner thigh. The
extensive complex collection along the posterior aspect of the knee
superficially recently evaluated on ultrasound is also favored to
represent massive localized lymphedema.

## 2022-12-31 IMAGING — CT CT TIBIA FIBULA *L* W/ CM
3 of 4 series · 11 of 33 positions shown, 12 images · IV contrast (omnipaque)
Comparison: Same day ultrasound, left hip MRI 04/26/2019

CLINICAL DATA: Soft tissue infection suspected, hip, xray done

EXAM:
CT OF THE LOWER RIGHT EXTREMITY WITH CONTRAST
TECHNIQUE: Multidetector CT imaging of the lower right extremity including the
left femur, left tibia and fibula, and left foot, was performed
according to the standard protocol following intravenous contrast
administration.
CONTRAST:  75mL OMNIPAQUE IOHEXOL 300 MG/ML  SOLN

[Series 4: thin soft · axial · 0.57mm/px · z∈[+54,+606]mm · 5 of 1106 slices shown]
[im 139/1106  soft-tissue]
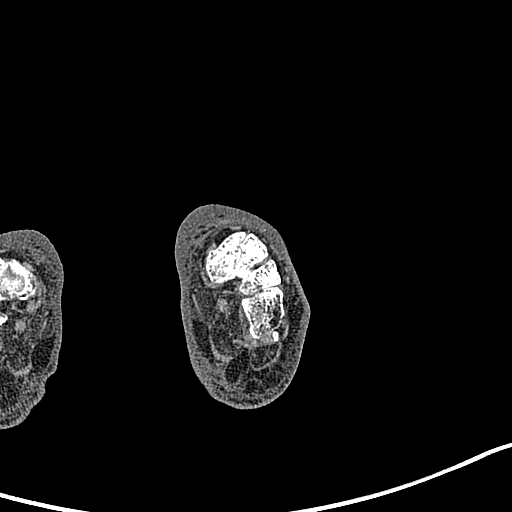
[im 277/1106  soft-tissue]
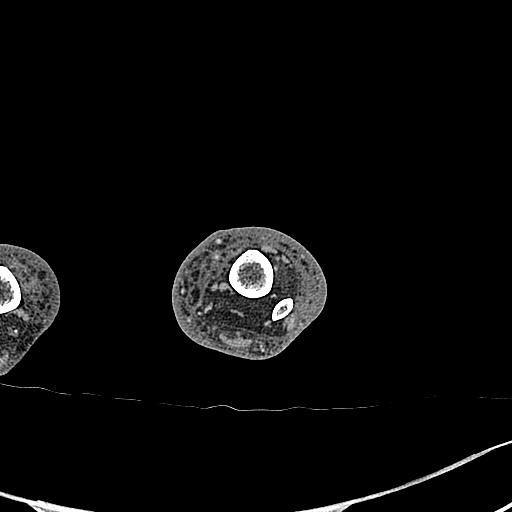
[im 415/1106  soft-tissue]
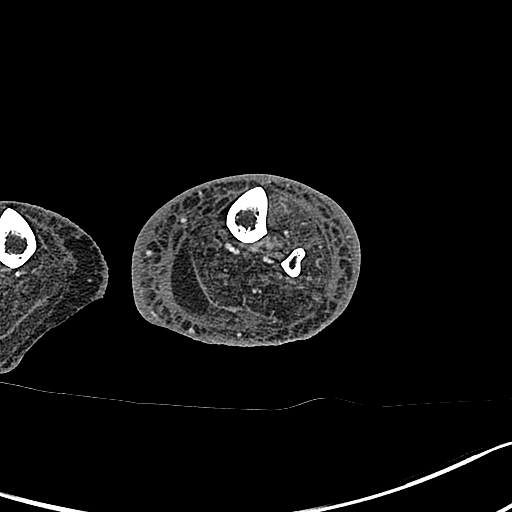
[im 553/1106  soft-tissue]
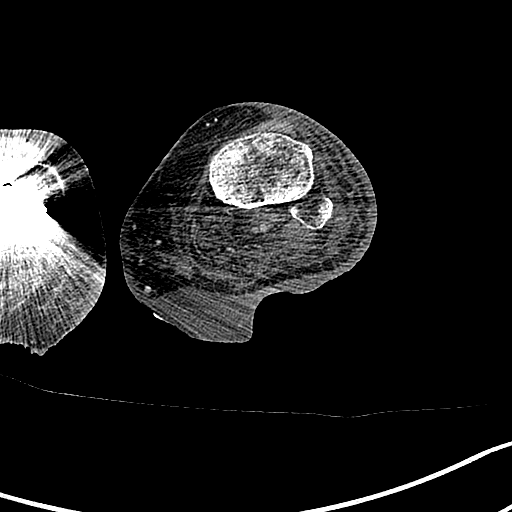
[im 691/1106  soft-tissue]
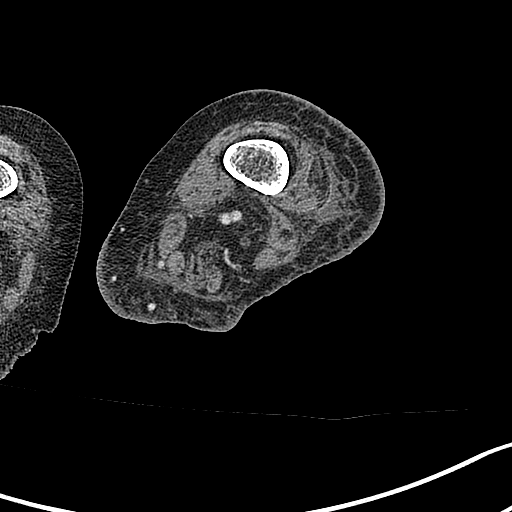

[Series 5: axial st · axial · 0.64mm/px · z∈[+192,+744]mm · 3 of 552 slices shown, 4 images]
[im 138/552  soft-tissue]
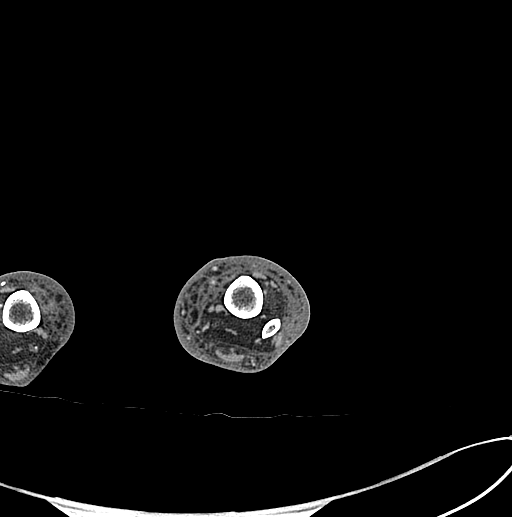
[im 138/552  bone]
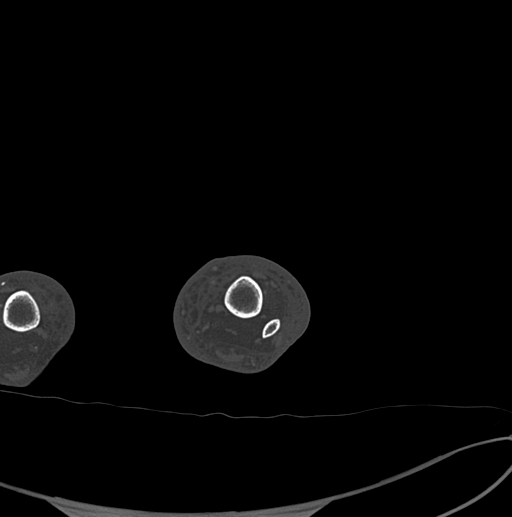
[im 276/552  bone]
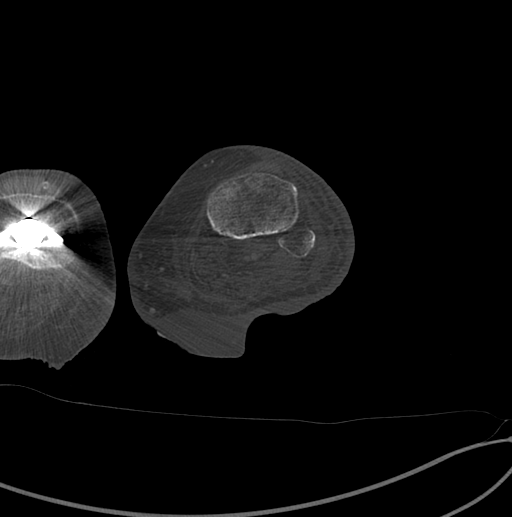
[im 414/552  bone]
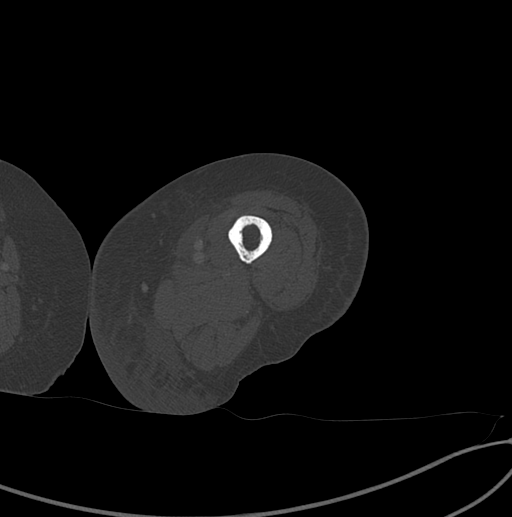

[Series 8: cor st · coronal · 0.72mm/px · 3 of 142 slices shown]
[im 29/142  bone]
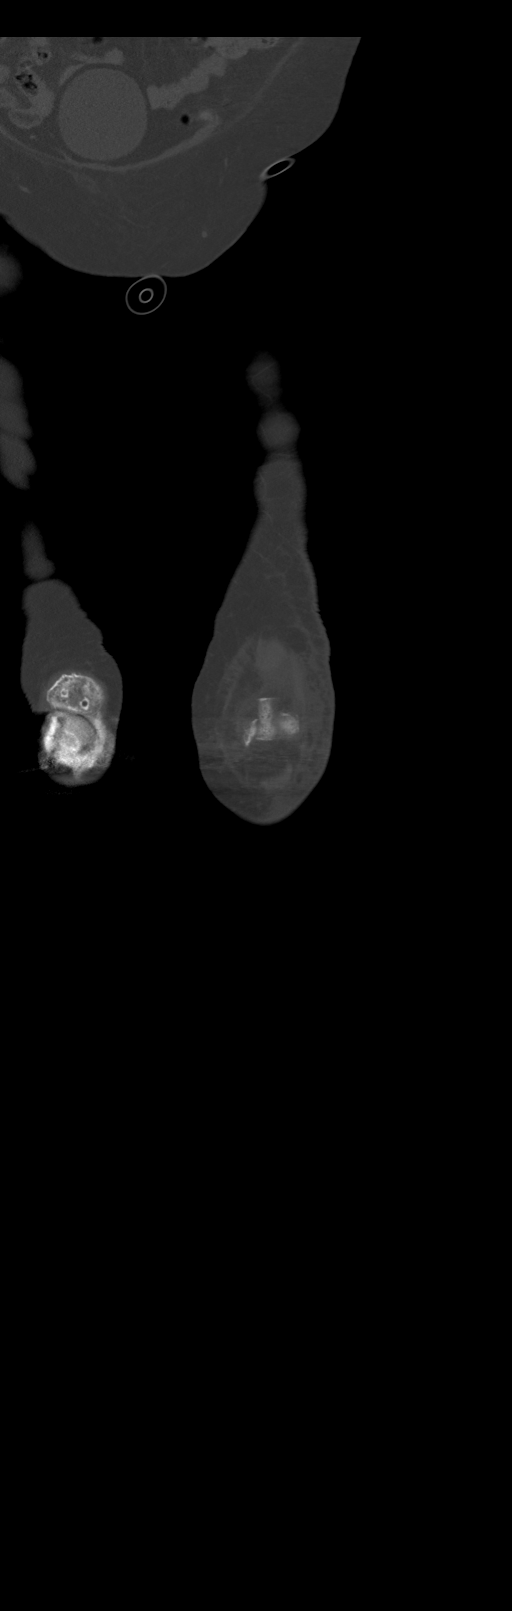
[im 57/142  bone]
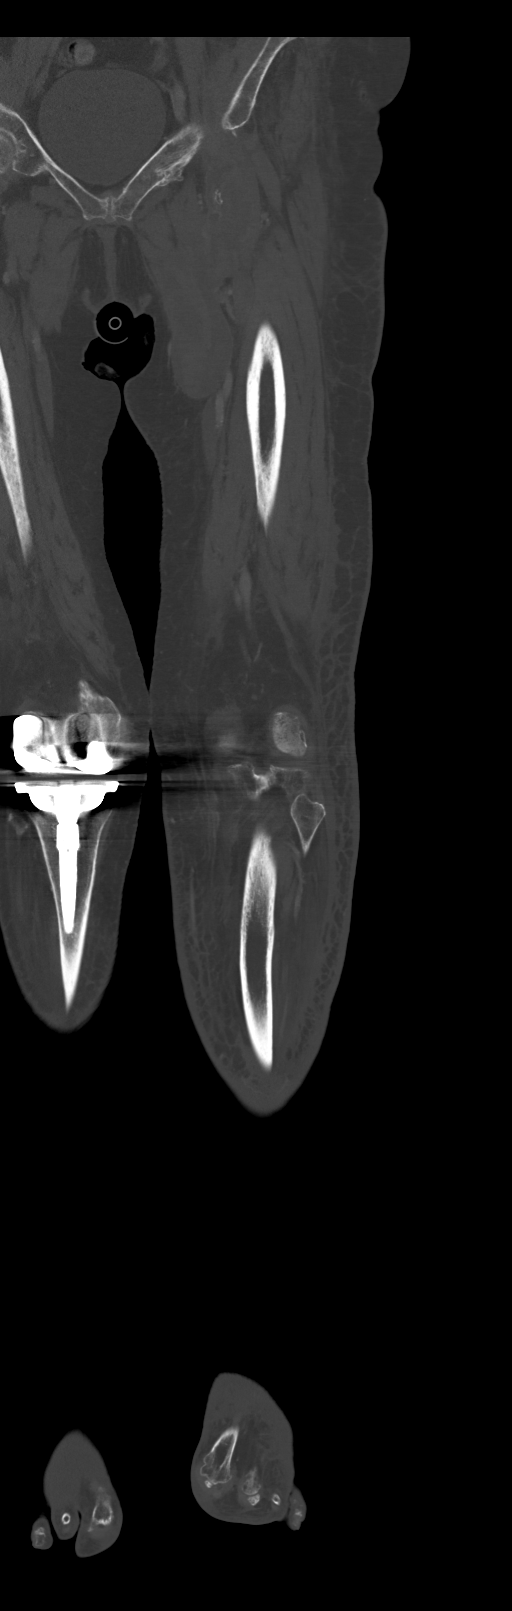
[im 85/142  bone]
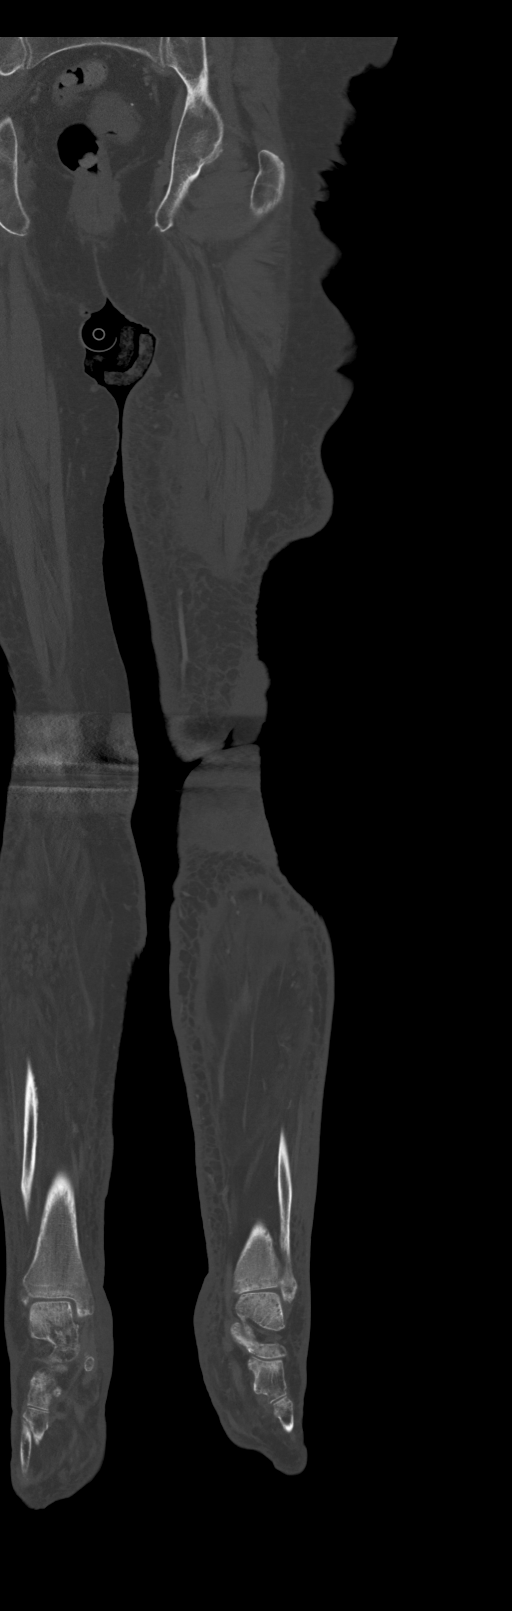

[11 of 33 positions shown; findings below may reference images not displayed]

FINDINGS: Bones/Joint/Cartilage

There are chronic fractures of the left superior and inferior pubic
rami. There is no evidence of left femur fracture. There is moderate
left hip osteoarthritis.

There is no evidence of acute fracture.

There is tricompartment knee arthritis, with moderate-severe medial
compartment joint space narrowing and trace joint effusion.

There is moderate tibiotalar osteoarthritis and degenerative changes
throughout the foot.

Diffuse osteopenia. There is no osseous destruction or periostitis
to suggest osteomyelitis.

Partially visualized right knee arthroplasty hardware.

Muscles and Tendons

There is a heterogeneous collection along the iliopsoas muscle of
the left hip, with heterotopic ossification, measuring up to 5.0 x
3.3 cm in the axial dimension. There is also a heterogeneous
collection extending within the abductor longus muscle measuring up
to 4.2 x 3.4 cm in the axial dimension.

There is moderate-severe atrophy of the lower leg musculature, and a
intramuscular lipoma involving the medial head gastrocnemius
measuring 4.1 x 1.7 cm.

Soft tissues

There is a coarse calcification along the lateral soft tissues at
the level of the distal femur, which is favored to represent sequela
of prior chronic hematoma.

Massive localized lymphedema of the upper inner thigh.

There is extensive soft tissue swelling of the left lower extremity
with skin thickening, relatively sparing the anterior thigh. There
is irregular skin thickening with subcutaneous water density
collection which extends along the skin surface of the posterior
distal thigh and posteriorly along the knee.

There is diffuse skin thickening and soft tissue swelling of the
lower leg as well extending through the ankle and foot.

There is no soft tissue gas. Partially visualized low right lower
extremity edema.

Partially visualized pelvic structures are unremarkable.
IMPRESSION: Heterogeneous collections along the distal iliopsoas muscle and
abductor longus muscles measuring up to 5.0 and 4.2 cm short axis
respectively, extending along the length of the muscles. These are
favored to represent hematomas, however infection cannot be
completely excluded.

Extensive skin thickening and subcutaneous soft tissue swelling
involving the thigh, lower leg, ankle and foot as can be seen in
lymphedema or cellulitis. No evidence of osteomyelitis. No deep
intramuscular compartment involvement of the lower leg.

Massive localized lymphedema along the upper inner thigh. The
extensive complex collection along the posterior aspect of the knee
superficially recently evaluated on ultrasound is also favored to
represent massive localized lymphedema.

## 2023-01-01 ENCOUNTER — Encounter (INDEPENDENT_AMBULATORY_CARE_PROVIDER_SITE_OTHER): Payer: Self-pay | Admitting: Vascular Surgery

## 2023-01-24 ENCOUNTER — Emergency Department: Payer: Medicare Other

## 2023-01-24 ENCOUNTER — Other Ambulatory Visit: Payer: Self-pay

## 2023-01-24 ENCOUNTER — Observation Stay: Payer: Medicare Other

## 2023-01-24 ENCOUNTER — Inpatient Hospital Stay
Admission: EM | Admit: 2023-01-24 | Discharge: 2023-01-28 | DRG: 682 | Disposition: A | Discharge status: Skilled Nursing Facility | Payer: Medicare Other | Attending: Internal Medicine | Admitting: Internal Medicine

## 2023-01-24 DIAGNOSIS — Z1152 Encounter for screening for COVID-19: Secondary | ICD-10-CM

## 2023-01-24 DIAGNOSIS — Z91014 Allergy to mammalian meats: Secondary | ICD-10-CM

## 2023-01-24 DIAGNOSIS — I10 Essential (primary) hypertension: Secondary | ICD-10-CM | POA: Diagnosis present

## 2023-01-24 DIAGNOSIS — N179 Acute kidney failure, unspecified: Secondary | ICD-10-CM | POA: Diagnosis not present

## 2023-01-24 DIAGNOSIS — Z880 Allergy status to penicillin: Secondary | ICD-10-CM

## 2023-01-24 DIAGNOSIS — G6181 Chronic inflammatory demyelinating polyneuritis: Secondary | ICD-10-CM | POA: Diagnosis present

## 2023-01-24 DIAGNOSIS — D72829 Elevated white blood cell count, unspecified: Secondary | ICD-10-CM | POA: Insufficient documentation

## 2023-01-24 DIAGNOSIS — N1832 Chronic kidney disease, stage 3b: Secondary | ICD-10-CM | POA: Diagnosis present

## 2023-01-24 DIAGNOSIS — I5032 Chronic diastolic (congestive) heart failure: Secondary | ICD-10-CM | POA: Diagnosis present

## 2023-01-24 DIAGNOSIS — E1122 Type 2 diabetes mellitus with diabetic chronic kidney disease: Secondary | ICD-10-CM | POA: Diagnosis present

## 2023-01-24 DIAGNOSIS — Z823 Family history of stroke: Secondary | ICD-10-CM

## 2023-01-24 DIAGNOSIS — Z7901 Long term (current) use of anticoagulants: Secondary | ICD-10-CM

## 2023-01-24 DIAGNOSIS — R4701 Aphasia: Secondary | ICD-10-CM | POA: Diagnosis not present

## 2023-01-24 DIAGNOSIS — Z79899 Other long term (current) drug therapy: Secondary | ICD-10-CM

## 2023-01-24 DIAGNOSIS — G8929 Other chronic pain: Secondary | ICD-10-CM | POA: Diagnosis present

## 2023-01-24 DIAGNOSIS — Z7984 Long term (current) use of oral hypoglycemic drugs: Secondary | ICD-10-CM

## 2023-01-24 DIAGNOSIS — Z8673 Personal history of transient ischemic attack (TIA), and cerebral infarction without residual deficits: Secondary | ICD-10-CM

## 2023-01-24 DIAGNOSIS — Z86718 Personal history of other venous thrombosis and embolism: Secondary | ICD-10-CM

## 2023-01-24 DIAGNOSIS — Z809 Family history of malignant neoplasm, unspecified: Secondary | ICD-10-CM

## 2023-01-24 DIAGNOSIS — I5033 Acute on chronic diastolic (congestive) heart failure: Secondary | ICD-10-CM | POA: Diagnosis present

## 2023-01-24 DIAGNOSIS — Z8249 Family history of ischemic heart disease and other diseases of the circulatory system: Secondary | ICD-10-CM

## 2023-01-24 DIAGNOSIS — M1611 Unilateral primary osteoarthritis, right hip: Secondary | ICD-10-CM | POA: Diagnosis present

## 2023-01-24 DIAGNOSIS — J189 Pneumonia, unspecified organism: Secondary | ICD-10-CM | POA: Diagnosis present

## 2023-01-24 DIAGNOSIS — I130 Hypertensive heart and chronic kidney disease with heart failure and stage 1 through stage 4 chronic kidney disease, or unspecified chronic kidney disease: Secondary | ICD-10-CM | POA: Diagnosis present

## 2023-01-24 DIAGNOSIS — G9341 Metabolic encephalopathy: Secondary | ICD-10-CM | POA: Diagnosis present

## 2023-01-24 DIAGNOSIS — M25551 Pain in right hip: Secondary | ICD-10-CM | POA: Diagnosis present

## 2023-01-24 DIAGNOSIS — R4781 Slurred speech: Secondary | ICD-10-CM

## 2023-01-24 DIAGNOSIS — E782 Mixed hyperlipidemia: Secondary | ICD-10-CM | POA: Diagnosis present

## 2023-01-24 DIAGNOSIS — Z7952 Long term (current) use of systemic steroids: Secondary | ICD-10-CM

## 2023-01-24 LAB — CBC WITH DIFFERENTIAL/PLATELET
Abs Immature Granulocytes: 0.1 10*3/uL — ABNORMAL HIGH (ref 0.00–0.07)
Basophils Absolute: 0.1 10*3/uL (ref 0.0–0.1)
Basophils Relative: 0 %
Eosinophils Absolute: 0.1 10*3/uL (ref 0.0–0.5)
Eosinophils Relative: 0 %
HCT: 43.4 % (ref 36.0–46.0)
Hemoglobin: 13.4 g/dL (ref 12.0–15.0)
Immature Granulocytes: 1 %
Lymphocytes Relative: 5 %
Lymphs Abs: 0.9 10*3/uL (ref 0.7–4.0)
MCH: 27.3 pg (ref 26.0–34.0)
MCHC: 30.9 g/dL (ref 30.0–36.0)
MCV: 88.6 fL (ref 80.0–100.0)
Monocytes Absolute: 0.9 10*3/uL (ref 0.1–1.0)
Monocytes Relative: 5 %
Neutro Abs: 15.7 10*3/uL — ABNORMAL HIGH (ref 1.7–7.7)
Neutrophils Relative %: 89 %
Platelets: 140 10*3/uL — ABNORMAL LOW (ref 150–400)
RBC: 4.9 MIL/uL (ref 3.87–5.11)
RDW: 14.4 % (ref 11.5–15.5)
WBC: 17.7 10*3/uL — ABNORMAL HIGH (ref 4.0–10.5)
nRBC: 0 % (ref 0.0–0.2)

## 2023-01-24 LAB — GLUCOSE, CAPILLARY: Glucose-Capillary: 319 mg/dL — ABNORMAL HIGH (ref 70–99)

## 2023-01-24 LAB — COMPREHENSIVE METABOLIC PANEL
ALT: 20 U/L (ref 0–44)
AST: 36 U/L (ref 15–41)
Albumin: 3 g/dL — ABNORMAL LOW (ref 3.5–5.0)
Alkaline Phosphatase: 44 U/L (ref 38–126)
Anion gap: 10 (ref 5–15)
BUN: 44 mg/dL — ABNORMAL HIGH (ref 8–23)
CO2: 23 mmol/L (ref 22–32)
Calcium: 9.1 mg/dL (ref 8.9–10.3)
Chloride: 102 mmol/L (ref 98–111)
Creatinine, Ser: 1.63 mg/dL — ABNORMAL HIGH (ref 0.44–1.00)
GFR, Estimated: 30 mL/min — ABNORMAL LOW (ref >60)
Glucose, Bld: 356 mg/dL — ABNORMAL HIGH (ref 70–99)
Potassium: 4.9 mmol/L (ref 3.5–5.1)
Sodium: 135 mmol/L (ref 135–145)
Total Bilirubin: 1.2 mg/dL (ref 0.3–1.2)
Total Protein: 5.9 g/dL — ABNORMAL LOW (ref 6.5–8.1)

## 2023-01-24 LAB — PROCALCITONIN: Procalcitonin: 25.89 ng/mL

## 2023-01-24 LAB — RESP PANEL BY RT-PCR (RSV, FLU A&B, COVID)  RVPGX2
Influenza A by PCR: NEGATIVE
Influenza B by PCR: NEGATIVE
Resp Syncytial Virus by PCR: NEGATIVE
SARS Coronavirus 2 by RT PCR: NEGATIVE

## 2023-01-24 LAB — LACTIC ACID, PLASMA
Lactic Acid, Venous: 3.9 mmol/L (ref 0.5–1.9)
Lactic Acid, Venous: 2.9 mmol/L (ref 0.5–1.9)

## 2023-01-24 LAB — TROPONIN I (HIGH SENSITIVITY): Troponin I (High Sensitivity): 74 ng/L — ABNORMAL HIGH (ref <18)

## 2023-01-24 MED ORDER — SODIUM CHLORIDE 0.9 % IV SOLN: INTRAVENOUS | Status: AC

## 2023-01-24 MED ORDER — ROSUVASTATIN CALCIUM 10 MG PO TABS
10.0000 mg | ORAL_TABLET | Freq: Every day | ORAL | Status: DC
  Administered 2023-01-24 – 2023-01-28 (×5): 10 mg via ORAL
  Filled 2023-01-24 (×5): qty 1

## 2023-01-24 MED ORDER — ACETAMINOPHEN 650 MG RE SUPP: 650.0000 mg | RECTAL | Status: DC | PRN

## 2023-01-24 MED ORDER — ACETAMINOPHEN 325 MG PO TABS: 650.0000 mg | ORAL_TABLET | ORAL | Status: DC | PRN

## 2023-01-24 MED ORDER — LACTATED RINGERS IV BOLUS
1000.0000 mL | Freq: Once | INTRAVENOUS | Status: AC
  Administered 2023-01-24: 1000 mL via INTRAVENOUS

## 2023-01-24 MED ORDER — SODIUM CHLORIDE 0.9 % IV SOLN: 500.0000 mg | Freq: Once | INTRAVENOUS | Status: DC

## 2023-01-24 MED ORDER — OXYBUTYNIN CHLORIDE 5 MG PO TABS
2.5000 mg | ORAL_TABLET | Freq: Two times a day (BID) | ORAL | Status: DC
  Administered 2023-01-24 – 2023-01-28 (×8): 2.5 mg via ORAL
  Filled 2023-01-24 (×7): qty 1
  Filled 2023-01-24: qty 0.5

## 2023-01-24 MED ORDER — ACETAMINOPHEN 160 MG/5ML PO SOLN: 650.0000 mg | ORAL | Status: DC | PRN

## 2023-01-24 MED ORDER — STROKE: EARLY STAGES OF RECOVERY BOOK: Freq: Once | Status: AC

## 2023-01-24 MED ORDER — HEPARIN SODIUM (PORCINE) 5000 UNIT/ML IJ SOLN: 5000.0000 [IU] | Freq: Three times a day (TID) | INTRAMUSCULAR | Status: DC

## 2023-01-24 MED ORDER — ASPIRIN 81 MG PO CHEW
324.0000 mg | CHEWABLE_TABLET | Freq: Once | ORAL | Status: AC
  Administered 2023-01-24: 324 mg via ORAL
  Filled 2023-01-24: qty 4

## 2023-01-24 MED ORDER — BENZONATATE 100 MG PO CAPS: 200.0000 mg | ORAL_CAPSULE | Freq: Two times a day (BID) | ORAL | Status: AC | PRN

## 2023-01-24 MED ORDER — SODIUM CHLORIDE 0.9 % IV SOLN: 2.0000 g | Freq: Once | INTRAVENOUS | Status: DC

## 2023-01-24 MED ORDER — SODIUM CHLORIDE 0.9 % IV SOLN
500.0000 mg | INTRAVENOUS | Status: DC
  Administered 2023-01-24 – 2023-01-25 (×2): 500 mg via INTRAVENOUS
  Filled 2023-01-24: qty 5
  Filled 2023-01-24: qty 500

## 2023-01-24 MED ORDER — SODIUM CHLORIDE 0.9 % IV SOLN
2.0000 g | INTRAVENOUS | Status: DC
  Administered 2023-01-24 – 2023-01-26 (×3): 2 g via INTRAVENOUS
  Filled 2023-01-24: qty 20
  Filled 2023-01-24 (×2): qty 2

## 2023-01-24 MED ORDER — FAMOTIDINE 20 MG PO TABS
20.0000 mg | ORAL_TABLET | Freq: Two times a day (BID) | ORAL | Status: DC
  Administered 2023-01-24 – 2023-01-28 (×8): 20 mg via ORAL
  Filled 2023-01-24 (×8): qty 1

## 2023-01-24 MED ORDER — RIVAROXABAN 10 MG PO TABS
10.0000 mg | ORAL_TABLET | Freq: Every day | ORAL | Status: DC
  Administered 2023-01-25 – 2023-01-27 (×3): 10 mg via ORAL
  Filled 2023-01-24 (×4): qty 1

## 2023-01-24 MED ORDER — INSULIN ASPART 100 UNIT/ML IJ SOLN
0.0000 [IU] | Freq: Every day | INTRAMUSCULAR | Status: DC
  Administered 2023-01-24: 4 [IU] via SUBCUTANEOUS
  Administered 2023-01-25 – 2023-01-27 (×3): 2 [IU] via SUBCUTANEOUS
  Filled 2023-01-24 (×4): qty 1

## 2023-01-24 MED ORDER — SENNOSIDES-DOCUSATE SODIUM 8.6-50 MG PO TABS: 1.0000 | ORAL_TABLET | Freq: Every evening | ORAL | Status: DC | PRN

## 2023-01-24 MED ORDER — MELATONIN 5 MG PO TABS: 5.0000 mg | ORAL_TABLET | Freq: Every evening | ORAL | Status: DC | PRN

## 2023-01-24 MED ORDER — HYDRALAZINE HCL 20 MG/ML IJ SOLN: 5.0000 mg | Freq: Three times a day (TID) | INTRAMUSCULAR | Status: DC | PRN

## 2023-01-24 MED ORDER — HYDROCODONE-ACETAMINOPHEN 5-325 MG PO TABS
1.0000 | ORAL_TABLET | Freq: Three times a day (TID) | ORAL | Status: DC | PRN
  Administered 2023-01-25 – 2023-01-26 (×2): 1 via ORAL
  Filled 2023-01-24 (×2): qty 1

## 2023-01-24 MED ORDER — INSULIN ASPART 100 UNIT/ML IJ SOLN
0.0000 [IU] | Freq: Three times a day (TID) | INTRAMUSCULAR | Status: DC
  Administered 2023-01-25: 1 [IU] via SUBCUTANEOUS
  Administered 2023-01-25: 5 [IU] via SUBCUTANEOUS
  Administered 2023-01-26: 2 [IU] via SUBCUTANEOUS
  Administered 2023-01-26: 3 [IU] via SUBCUTANEOUS
  Administered 2023-01-27 (×2): 5 [IU] via SUBCUTANEOUS
  Administered 2023-01-28: 2 [IU] via SUBCUTANEOUS
  Filled 2023-01-24 (×7): qty 1

## 2023-01-24 MED ORDER — PREDNISONE 10 MG PO TABS
10.0000 mg | ORAL_TABLET | Freq: Every day | ORAL | Status: DC
  Administered 2023-01-25 – 2023-01-28 (×4): 10 mg via ORAL
  Filled 2023-01-24 (×4): qty 1

## 2023-01-24 MED ORDER — TRAMADOL HCL 50 MG PO TABS: 50.0000 mg | ORAL_TABLET | Freq: Three times a day (TID) | ORAL | Status: DC | PRN

## 2023-01-24 MED ORDER — OYSTER SHELL CALCIUM/D3 500-5 MG-MCG PO TABS
1.0000 | ORAL_TABLET | Freq: Every day | ORAL | Status: DC
  Administered 2023-01-24 – 2023-01-27 (×4): 1 via ORAL
  Filled 2023-01-24 (×4): qty 1

## 2023-01-24 NOTE — Assessment & Plan Note (Addendum)
At baseline CKD stage IIIa.  Resolved with IV fluids.  Creatinine currently better than baseline

## 2023-01-24 NOTE — Assessment & Plan Note (Signed)
-  Hydralazine 5 mg IV every 8 hours as needed for SBP greater than 175, 4 days ordered

## 2023-01-24 NOTE — Assessment & Plan Note (Signed)
-  Rosuvastatin 10 mg daily resumed

## 2023-01-24 NOTE — ED Triage Notes (Signed)
Pt to ED brought by son for R hip pain since 2 days ago, worse since this AM. Denies known injury. Son states pt had slurred speech this AM which lasted 30 minutes and resolved on own. This happened about 1130am.

## 2023-01-24 NOTE — Assessment & Plan Note (Addendum)
Responded well to antibiotics with procalcitonin continuing to decrease daily and white blood cell count normalized.  Sepsis ruled out given only 1 SIRS criteria.  Patient discharged on few more days of p.o. Ceftin and Zithromax.

## 2023-01-24 NOTE — ED Notes (Signed)
Report received from Amy, RN.

## 2023-01-24 NOTE — H&P (Signed)
History and Physical   Linda Hess LPF:[id number] DOB: 1931-04-11 DOA: 01/24/2023  PCP: Vidal Schwalbe, MD  Outpatient Specialists: Dr. Delana Meyer, vascular surgery Patient coming from: Home via [facility]  I have personally briefly reviewed patient's old medical records in [place].  Chief Concern: Slurred speech, expressive aphasia  HPI: [name] is an 89 year old female with history of hypertension, hyperlipidemia, history of DVT on Xarelto, who presents emergency department for chief concerns of expressive aphasia.  Initial vitals in the ED showed temperature of 98.4, respiration rate of 16, heart rate of 77, blood pressure 106/69, SpO2 of 91% on room air.  Serum sodium is 135, potassium 4.9, chloride 102, bicarb 23, BUN of 44, serum creatinine 1.63, EGFR 30, nonfasting blood glucose 356, WBC 17.7, hemoglobin 13.4, platelets 140.  COVID/influenza A/influenza B/RSV PCR were negative.  CT head without contrast: Was read as no acute intracranial abnormality.  Stable atrophy and chronic small vessel ischemia.  Right hip x-ray: Was read as no cause for acute right hip pain identified.  Chest x-ray 2 view: New left lung opacity noted concerning for pneumonia.  ED treatment: Aspirin 324 mg p.o. one-time dose, LR 1 L bolus. ------------------------------------ At bedside, she is able to tell me her name, age, current location, current year.  She does not appear to be in acute distress.  Son reports his mother had slurred speech, at approximately 11 am, increased sleepiness.  He further endorses that she was unable to get her words out as normal.  This lasted approximately 5 to 10 minutes.  She had one episode of vomiting night Friday.  Patient reports it was too dark for her to see what the vomitus looked like.  She endorses little decreased appetite.  She denies fever, cough, chills, chest pain, shortness of breath, dysuria, diarrhea, hematuria, syncope, loss of  consciousness.  Social history: She lives by herself. She gets care on weekdays and on weekends, her children stays with her. She denies tobacco, etoh, and recreational drug use.   ROS: Constitutional: no weight change, no fever ENT/Mouth: no sore throat, no rhinorrhea Eyes: no eye pain, no vision changes Cardiovascular: no chest pain, no dyspnea,  no edema, no palpitations Respiratory: no cough, no sputum, no wheezing Gastrointestinal: no nausea, no vomiting, no diarrhea, no constipation Genitourinary: no urinary incontinence, no dysuria, no hematuria Musculoskeletal: no arthralgias, no myalgias Skin: no skin lesions, no pruritus, Neuro: + weakness, no loss of consciousness, no syncope Psych: no anxiety, no depression, + decrease appetite Heme/Lymph: no bruising, no bleeding  ED Course: Discussed with emergency medicine provider, patient requiring hospitalization for chief concerns of altered mental status concerning for TIA.  Assessment/Plan  Principal Problem:   AKI (acute kidney injury) ([place]) Active Problems:   Left lower lobe pneumonia   Essential hypertension, benign   Mixed hyperlipidemia   Right hip pain   Aphasia   Leukocytosis   Assessment and Plan:  * AKI (acute kidney injury) ([place]) At baseline CKD stage IIIa - Presumed prerenal secondary to poor p.o. intake and vomiting - Sodium chloride 100 mL/h, 1 day ordered - Strict I's and O's  Left lower lobe pneumonia - Check procalcitonin on admission and 2-day follow-up to ensure appropriate antibiotic treatment - Ceftriaxone and azithromycin, 5-day course ordered - Incentive spirometry and flutter valve every 2 hours while awake - Aspiration precautions  Essential hypertension, benign - Hydralazine 5 mg IV every 8 hours as needed for SBP greater than 175, 4 days ordered  Mixed  hyperlipidemia - Rosuvastatin 10 mg daily resumed  Leukocytosis - Multifactorial including reactive in setting of chronic steroid use  and left lower lobe pneumonia - Check CBC in the a.m.  Aphasia Query TIA - This has resolved per family - This lasted approximately 5 to 10 minutes - MRI of the brain without contrast ordered, lipid panel in the a.m.  Right hip pain - Right hip x-ray were negative for acute right hip causes - I suspect this is secondary to osteoarthritis - Tramadol 50 mg p.o. every 8 hours as needed for moderate pain, hydrocodone-acetaminophen 5 every 8 hours as needed for severe pain ordered  Chronic steroid use-I did not discontinue on admission due to concerns for adrenal insufficiency - It appears patient has been on it since 2022 for osteoarthritic pain/chronic pain - Recommend patient follow-up with outpatient PCP for management  Chart reviewed.   DVT prophylaxis: Xarelto Code Status: Full code Diet: Heart healthy/carb modified Family Communication: Updated son at bedside with patient's permission Disposition Plan: Pending clinical course Consults called: None at this time Admission status: Telemetry medical, observation  Past Medical History:  Diagnosis Date   CIDP (chronic inflammatory demyelinating polyneuropathy) (HCC)    Essential hypertension, benign    pt denies   History of DVT (deep vein thrombosis)    Reportedly left leg, details not clear   Mixed hyperlipidemia    Type 2 diabetes mellitus (HCC)     Past Surgical History:  Procedure Laterality Date   CHOLECYSTECTOMY     Left quadriceps muscle biopsy  March 2010   Right knee replacement  September 2014   Social History:  reports that she has never smoked. She has never used smokeless tobacco. She reports that she does not drink alcohol and does not use drugs.  Allergies  Allergen Reactions   Amoxicillin-Pot Clavulanate Hives   Other Hives and Itching    RED MEAT   Penicillins     hives Other reaction(s): rash   Family History  Problem Relation Age of Onset   Heart attack Father    Stroke Mother    Cancer  Brother    Family history: Family history reviewed and not pertinent  Prior to Admission medications   Medication Sig Start Date End Date Taking? Authorizing Provider  acetaminophen (TYLENOL) 325 MG tablet Take 325 mg by mouth every 6 (six) hours as needed for mild pain.    [provider]  calcium-vitamin D (OSCAL WITH D) 500-200 MG-UNIT per tablet Take 1 tablet by mouth at bedtime.  03/14/14   Jonelle Sidle, MD  gabapentin (NEURONTIN) 100 MG capsule Take 2 capsules (200 mg total) by mouth 3 (three) times daily. 10/25/22   Enedina Finner, MD  HYDROcodone-acetaminophen (NORCO/VICODIN) 5-325 MG tablet Take 1 tablet by mouth every 8 (eight) hours as needed. 10/25/22   Enedina Finner, MD  JANUVIA 100 MG tablet Take 100 mg by mouth daily. 03/21/20   [provider]  metFORMIN (GLUCOPHAGE-XR) 500 MG 24 hr tablet Take 1 tablet (500 mg total) by mouth daily with breakfast. 07/25/21   Shon Hale, MD  Omega-3 Fatty Acids (FISH OIL) 1200 MG CAPS Take 1 capsule by mouth every morning.     [provider]  oxybutynin (DITROPAN) 5 MG tablet Take 0.5 tablets by mouth 2 (two) times daily.    [provider]  predniSONE (DELTASONE) 10 MG tablet Take 1 tablet (10 mg total) by mouth daily with breakfast. 07/25/21   Shon Hale, MD  rivaroxaban (  XARELTO) 10 MG TABS tablet Take 1 tablet (10 mg total) by mouth daily. 10/26/22   Fritzi Mandes, MD  rosuvastatin (CRESTOR) 10 MG tablet Take 10 mg by mouth daily. 03/30/19   [provider]  traMADol (ULTRAM) 50 MG tablet Take 1 tablet (50 mg total) by mouth every 8 (eight) hours as needed for moderate pain. 10/25/22   Fritzi Mandes, MD  vitamin C (ASCORBIC ACID) 500 MG tablet Take 500 mg by mouth daily.    [provider]   Physical Exam: Vitals:   01/24/23 1352 01/24/23 1359 01/24/23 1932 01/24/23 1938  BP:   (!) 109/52   Pulse:   70   Resp:   19   Temp:  98.4 F (36.9 C) 97.6 F (36.4 C)   TempSrc:  Oral  Oral   SpO2:   (!) 87% 95%  Weight: 63.5 kg     Height: 5\' 6"  (1.676 m)      Constitutional: appears age-appropriate, frail, NAD, calm, comfortable Eyes: PERRL, lids and conjunctivae normal ENMT: Mucous membranes are moist. Posterior pharynx clear of any exudate or lesions. Age-appropriate dentition. Hearing appropriate Neck: normal, supple, no masses, no thyromegaly Respiratory: clear to auscultation bilaterally, no wheezing, no crackles. Normal respiratory effort. No accessory muscle use.  Cardiovascular: Regular rate and rhythm, no murmurs / rubs / gallops. No extremity edema. 2+ pedal pulses. No carotid bruits.  Abdomen: no tenderness, no masses palpated, no hepatosplenomegaly. Bowel sounds positive.  Musculoskeletal: no clubbing / cyanosis. No joint deformity upper and lower extremities. Good ROM, no contractures, no atrophy. Normal muscle tone.  Skin: no rashes, lesions, ulcers. No induration Neurologic: Sensation intact. Strength 5/5 in all 4.  Psychiatric: Normal judgment and insight. Alert and oriented x 3. Normal mood.   EKG: independently reviewed, showing sinus rhythm with rate of 74, QTc 455  Chest x-ray on Admission: I personally reviewed and I agree with radiologist reading as below.  DG Chest 2 View  Result Date: 01/24/2023 CLINICAL DATA:  Shortness of breath. EXAM: CHEST - 2 VIEW COMPARISON:  October 20, 2022. FINDINGS: Stable cardiomegaly. New large left lower lobe airspace opacity is noted concerning for pneumonia. Right lung is clear. Bony thorax is unremarkable. IMPRESSION: New left lung opacity is noted concerning for pneumonia. Followup PA and lateral chest X-ray is recommended in 3-4 weeks following trial of antibiotic therapy to ensure resolution and exclude underlying malignancy. Aortic Atherosclerosis (ICD10-I70.0). Electronically Signed   By: Marijo Conception M.D.   On: 01/24/2023 18:56   CT HEAD WO CONTRAST (5MM)  Result Date: 01/24/2023 CLINICAL DATA:  Neuro  deficit, acute, stroke suspected EXAM: CT HEAD WITHOUT CONTRAST TECHNIQUE: Contiguous axial images were obtained from the base of the skull through the vertex without intravenous contrast. RADIATION DOSE REDUCTION: This exam was performed according to the departmental dose-optimization program which includes automated exposure control, adjustment of the mA and/or kV according to patient size and/or use of iterative reconstruction technique. COMPARISON:  10/20/2022 FINDINGS: Brain: No intracranial hemorrhage, mass effect, or midline shift. Stable degree of atrophy and chronic small vessel ischemia. No hydrocephalus. The basilar cisterns are patent. No evidence of territorial infarct or acute ischemia. No extra-axial or intracranial fluid collection. Vascular: Atherosclerosis of skullbase vasculature without hyperdense vessel or abnormal calcification. Skull: No fracture or focal lesion. Sinuses/Orbits: Paranasal sinuses and mastoid air cells are clear. The visualized orbits are unremarkable. Bilateral lens resection. Other: None. IMPRESSION: 1. No acute intracranial abnormality. 2. Stable atrophy and chronic small vessel  ischemia. Electronically Signed   By: Narda Rutherford M.D.   On: 01/24/2023 17:33   DG HIP UNILAT WITH PELVIS 2-3 VIEWS RIGHT  Result Date: 01/24/2023 CLINICAL DATA:  Pain for 2 days. EXAM: DG HIP (WITH OR WITHOUT PELVIS) 2-3V RIGHT COMPARISON:  None Available. FINDINGS: No fracture, dislocation, or bony lesion. Degenerative changes in the hips without loss of joint space. No other acute abnormalities. IMPRESSION: No cause for acute right hip pain identified. Electronically Signed   By: Gerome Sam III M.D.   On: 01/24/2023 14:29    Labs on Admission: I have personally reviewed following labs  CBC: Recent Labs  Lab 01/24/23 1602  WBC 17.7*  NEUTROABS 15.7*  HGB 13.4  HCT 43.4  MCV 88.6  PLT 140*   Basic Metabolic Panel: Recent Labs  Lab 01/24/23 1602  NA 135  K 4.9  CL  102  CO2 23  GLUCOSE 356*  BUN 44*  CREATININE 1.63*  CALCIUM 9.1   GFR: Estimated Creatinine Clearance: 21.9 mL/min (A) (by C-G formula based on SCr of 1.63 mg/dL (H)).  Liver Function Tests: Recent Labs  Lab 01/24/23 1602  AST 36  ALT 20  ALKPHOS 44  BILITOT 1.2  PROT 5.9*  ALBUMIN 3.0*   Urine analysis:    Component Value Date/Time   COLORURINE Yellow 08/22/2013 1201   COLORURINE AMBER BIOCHEMICALS MAY BE AFFECTED BY COLOR (A) 10/19/2008 2225   APPEARANCEUR Hazy 08/22/2013 1201   LABSPEC 1.018 08/22/2013 1201   PHURINE 6.0 08/22/2013 1201   PHURINE 5.5 10/19/2008 2225   GLUCOSEU >=500 08/22/2013 1201   HGBUR Negative 08/22/2013 1201   HGBUR MODERATE (A) 10/19/2008 2225   BILIRUBINUR Negative 08/22/2013 1201   KETONESUR Trace 08/22/2013 1201   KETONESUR NEGATIVE 10/19/2008 2225   PROTEINUR Negative 08/22/2013 1201   PROTEINUR >300 (A) 10/19/2008 2225   UROBILINOGEN 1.0 10/19/2008 2225   NITRITE Negative 08/22/2013 1201   NITRITE NEGATIVE 10/19/2008 2225   LEUKOCYTESUR Negative 08/22/2013 1201   This document was prepared using Dragon Voice Recognition software and may include unintentional dictation errors.  Dr. Sedalia Muta [facility]  If 7PM-7AM, please contact overnight-coverage provider If 7AM-7PM, please contact day coverage provider [email or web address]  01/24/2023, 7:39 PM

## 2023-01-24 NOTE — Assessment & Plan Note (Addendum)
Query TIA - This has resolved per family - This lasted approximately 5 to 10 minutes - MRI of the brain without contrast ordered, lipid panel in the a.m.

## 2023-01-24 NOTE — Hospital Course (Addendum)
[name] is an 89 year old female with history of hypertension, hyperlipidemia, history of DVT on Xarelto, who presents emergency department for chief concerns of expressive aphasia.  Initial vitals in the ED showed temperature of 98.4, respiration rate of 16, heart rate of 77, blood pressure 106/69, SpO2 of 91% on room air.  Serum sodium is 135, potassium 4.9, chloride 102, bicarb 23, BUN of 44, serum creatinine 1.63, EGFR 30, nonfasting blood glucose 356, WBC 17.7, hemoglobin 13.4, platelets 140.  COVID/influenza A/influenza B/RSV PCR were negative.  CT head without contrast: Was read as no acute intracranial abnormality.  Stable atrophy and chronic small vessel ischemia.  Right hip x-ray: Was read as no cause for acute right hip pain identified.  Chest x-ray 2 view: New left lung opacity noted concerning for pneumonia.  ED treatment: Aspirin 324 mg p.o. one-time dose, LR 1 L bolus.

## 2023-01-24 NOTE — ED Notes (Signed)
Wrote to EDP re: if bloodwork should be ordered for episode of slurred speech earlier today.

## 2023-01-24 NOTE — ED Provider Notes (Signed)
[facility] Provider Note    Event Date/Time   First MD Initiated Contact with Patient 01/24/23 1418     (approximate)   History   Hip Pain   HPI  Linda Hess is a 89 y.o. female past medical history of CIDP, hypertension, DVT on Xarelto, hyperlipidemia, diabetes who presents with hip pain and slurred speech.  Patient tells me that yesterday her hip started hurting denies any preceding trauma she has had hip pain in the past but was flaring yesterday so she took 2 extra doses of tramadol.  1 in the morning 1 at 2 PM and 1 at 11:30 PM.  She would typically only take 1 dose of tramadol.  This morning she slept until 11:30 AM which is unusual for her.  When she got up today her son notes that she was more difficult to arouse and had slurred speech.  Patient tells me that she remembers it was difficult to get her words out.  Her son says that initially she did not have her dentures and when she put the dentures back in speech seem to get better but was not normal.  She also seemed to have difficulty holding her eyes open.  He denies any facial droop or focal numbness tingling weakness.  About 30 minutes later she was back to normal.     Past Medical History:  Diagnosis Date   CIDP (chronic inflammatory demyelinating polyneuropathy) (HCC)    Essential hypertension, benign    pt denies   History of DVT (deep vein thrombosis)    Reportedly left leg, details not clear   Mixed hyperlipidemia    Type 2 diabetes mellitus (HCC)     Patient Active Problem List   Diagnosis Date Noted   Venous ulcer (HCC) 12/13/2022   Lymphedema 12/13/2022   Pressure injury of skin 10/22/2022   Acute on chronic diastolic CHF (congestive heart failure) (HCC) 10/21/2022   Fall 10/20/2022   Lymphedema of lower extremity 10/12/2022   Acute back pain with sciatica 10/06/2022   Hip pain 10/06/2022   Lumbar spondylosis 10/06/2022   Spinal stenosis of lumbar region 10/06/2022    Diabetic peripheral neuropathy (HCC) 03/24/2022   Foot pain 03/24/2022   Hallux valgus 03/24/2022   Ingrowing toenail 03/24/2022   Tibialis posterior tendinitis 03/24/2022   Leg swelling 07/24/2021   DVT (deep venous thrombosis) (HCC) 07/24/2021   Cellulitis of right upper extremity 12/14/2018   Peripheral arterial disease (HCC) 03/14/2014   CIDP (chronic inflammatory demyelinating polyneuropathy) (HCC) 03/14/2014   Mixed hyperlipidemia 03/12/2014   Essential hypertension, benign 03/12/2014   Type 2 diabetes mellitus with stage IIIb chronic kidney disease, without long-term current use of insulin (HCC) 03/12/2014     Physical Exam  Triage Vital Signs: ED Triage Vitals  Enc Vitals Group     BP 01/24/23 1351 106/69     Pulse Rate 01/24/23 1351 77     Resp 01/24/23 1351 16     Temp 01/24/23 1359 98.4 F (36.9 C)     Temp Source 01/24/23 1359 Oral     SpO2 01/24/23 1351 91 %     Weight 01/24/23 1352 140 lb (63.5 kg)     Height 01/24/23 1352 5\' 6"  (1.676 m)     Head Circumference --      Peak Flow --      Pain Score 01/24/23 1352 10     Pain Loc --      Pain Edu? --  Excl. in [place]? --     Most recent vital signs: Vitals:   01/24/23 1351 01/24/23 1359  BP: 106/69   Pulse: 77   Resp: 16   Temp:  98.4 F (36.9 C)  SpO2: 91%      General: Awake, no distress.  CV:  Good peripheral perfusion.  Resp:  Normal effort.  Abd:  No distention.  Neuro:             Awake, Alert, Oriented x 3  Aox3, nml speech  PERRL, EOMI, face symmetric, nml tongue movement  5/5 strength in the BL upper and lower extremities  Sensation grossly intact in the BL upper and lower extremities  Finger-nose-finger intact BL  Other:  Pitting edema bilateral lower extremities with chronic venous stasis changes  No focal tenderness over the bilateral hips, able to range internally and externally rotate bilateral hips without significant pain  ED Results / Procedures / Treatments  Labs (all labs  ordered are listed, but only abnormal results are displayed) Labs Reviewed  RESP PANEL BY RT-PCR (RSV, FLU A&B, COVID)  [id number]  COMPREHENSIVE METABOLIC PANEL  CBC WITH DIFFERENTIAL/PLATELET  URINALYSIS, ROUTINE W REFLEX MICROSCOPIC  URINE DRUG SCREEN, QUALITATIVE ([facility] ONLY)     EKG  EKG reviewed interpreted by myself shows sinus rhythm with sinus arrhythmia normal axis normal intervals biphasic T waves V4 through V6   RADIOLOGY I reviewed and interpreted the CT scan of the brain which does not show any acute intracranial process    PROCEDURES:  Critical Care performed: No  Procedures   MEDICATIONS ORDERED IN ED: Medications - No data to display   IMPRESSION / MDM / [place] / ED COURSE  I reviewed the triage vital signs and the nursing notes.                              Patient's presentation is most consistent with acute complicated illness / injury requiring diagnostic workup.  Differential diagnosis includes, but is not limited to, TIA, medication side effect, hypoglycemia, electrolyte abnormality, intracranial hemorrhage  Patient is a 89 year old female presents because of both right hip pain and slurred speech.  Her son notes that when she woke up this morning around 11:30 AM which was later than normal for her she was having speech that was slurred.  Patient tells me she remembers having difficulty getting her words out.  Apparently this lasted for about 30 minutes.  During this episode son says she was also more difficult to arouse and seemed to be falling asleep.  Did not notice any facial droop and patient denies feeling any numbness or weakness.  About 30 minutes later this resolved.  She does have some ongoing hip pain but it is not as severe as it was this morning.  Denies any preceding trauma.  Has had chronic hip pain in the past.  Of note she did take an extra tramadol last night.  Patient's vital signs are reassuring overall she looks well on  exam neurologically she is intact.  She is able to range both hips and has good strength in her lower extremities.  X-ray of the hip obtained from triage is negative for acute injury.  For slurred speech my primary differential is TIA versus medication side effect due to extra tramadol dose.  Given the altered level of consciousness makes me think this could be more metabolic or toxicologic but with the reported aphasia  TIA certainly is possibility.  She is already on Xarelto.  Will obtain CT head and labs will likely need admission for TIA workup.    Labs are notable for leukocytosis to 17 she is hyperglycemic as well with blood sugar 350 no anion gap she has an AKI creatinine 1.6 from baseline of around 0.9.  Question patient again about infectious symptoms she denies cough abdominal pain urinary symptoms.  Will send urinalysis and obtain chest x-ray COVID and flu testing was negative.  Chest x-ray does appear to have left lower lobe infiltrate.  Patient has no symptoms of cough or dyspnea but is satting 91% on room air with a leukocytosis do think she warrants treatment and ordered ceftriaxone and azithromycin.  Discussed with the hospitalist.  I would return aspirin.   FINAL CLINICAL IMPRESSION(S) / ED DIAGNOSES   Final diagnoses:  None     Rx / DC Orders   ED Discharge Orders     None        Note:  This document was prepared using Dragon voice recognition software and may include unintentional dictation errors.

## 2023-01-24 NOTE — ED Notes (Signed)
Patient placed on 2L oxygen due to oxygen sat being 875 on RA. Oxygen sat increased to 91% on 2L

## 2023-01-24 NOTE — Assessment & Plan Note (Signed)
-  Multifactorial including reactive in setting of chronic steroid use and left lower lobe pneumonia - Check CBC in the a.m.

## 2023-01-24 NOTE — Assessment & Plan Note (Addendum)
Suspect secondary to osteoarthritis.  As needed tramadol.  Have added lidocaine patch.  Seen by PT who are recommending skilled nursing.  X-rays unrevealing.

## 2023-01-25 DIAGNOSIS — I5033 Acute on chronic diastolic (congestive) heart failure: Secondary | ICD-10-CM | POA: Diagnosis present

## 2023-01-25 DIAGNOSIS — Z823 Family history of stroke: Secondary | ICD-10-CM | POA: Diagnosis not present

## 2023-01-25 DIAGNOSIS — R4701 Aphasia: Secondary | ICD-10-CM | POA: Diagnosis present

## 2023-01-25 DIAGNOSIS — E782 Mixed hyperlipidemia: Secondary | ICD-10-CM | POA: Diagnosis present

## 2023-01-25 DIAGNOSIS — G8929 Other chronic pain: Secondary | ICD-10-CM | POA: Diagnosis present

## 2023-01-25 DIAGNOSIS — Z7901 Long term (current) use of anticoagulants: Secondary | ICD-10-CM | POA: Diagnosis not present

## 2023-01-25 DIAGNOSIS — Z1152 Encounter for screening for COVID-19: Secondary | ICD-10-CM | POA: Diagnosis not present

## 2023-01-25 DIAGNOSIS — G6181 Chronic inflammatory demyelinating polyneuritis: Secondary | ICD-10-CM | POA: Diagnosis present

## 2023-01-25 DIAGNOSIS — G9341 Metabolic encephalopathy: Secondary | ICD-10-CM | POA: Diagnosis present

## 2023-01-25 DIAGNOSIS — M25551 Pain in right hip: Secondary | ICD-10-CM | POA: Diagnosis not present

## 2023-01-25 DIAGNOSIS — Z79899 Other long term (current) drug therapy: Secondary | ICD-10-CM | POA: Diagnosis not present

## 2023-01-25 DIAGNOSIS — Z7984 Long term (current) use of oral hypoglycemic drugs: Secondary | ICD-10-CM | POA: Diagnosis not present

## 2023-01-25 DIAGNOSIS — Z8249 Family history of ischemic heart disease and other diseases of the circulatory system: Secondary | ICD-10-CM | POA: Diagnosis not present

## 2023-01-25 DIAGNOSIS — M1611 Unilateral primary osteoarthritis, right hip: Secondary | ICD-10-CM | POA: Diagnosis present

## 2023-01-25 DIAGNOSIS — Z880 Allergy status to penicillin: Secondary | ICD-10-CM | POA: Diagnosis not present

## 2023-01-25 DIAGNOSIS — J189 Pneumonia, unspecified organism: Secondary | ICD-10-CM | POA: Diagnosis present

## 2023-01-25 DIAGNOSIS — E1122 Type 2 diabetes mellitus with diabetic chronic kidney disease: Secondary | ICD-10-CM | POA: Diagnosis present

## 2023-01-25 DIAGNOSIS — Z7952 Long term (current) use of systemic steroids: Secondary | ICD-10-CM | POA: Diagnosis not present

## 2023-01-25 DIAGNOSIS — Z86718 Personal history of other venous thrombosis and embolism: Secondary | ICD-10-CM | POA: Diagnosis not present

## 2023-01-25 DIAGNOSIS — N179 Acute kidney failure, unspecified: Secondary | ICD-10-CM | POA: Diagnosis not present

## 2023-01-25 DIAGNOSIS — Z809 Family history of malignant neoplasm, unspecified: Secondary | ICD-10-CM | POA: Diagnosis not present

## 2023-01-25 DIAGNOSIS — I5032 Chronic diastolic (congestive) heart failure: Secondary | ICD-10-CM | POA: Diagnosis not present

## 2023-01-25 DIAGNOSIS — Z91014 Allergy to mammalian meats: Secondary | ICD-10-CM | POA: Diagnosis not present

## 2023-01-25 DIAGNOSIS — N1832 Chronic kidney disease, stage 3b: Secondary | ICD-10-CM | POA: Diagnosis present

## 2023-01-25 DIAGNOSIS — I130 Hypertensive heart and chronic kidney disease with heart failure and stage 1 through stage 4 chronic kidney disease, or unspecified chronic kidney disease: Secondary | ICD-10-CM | POA: Diagnosis present

## 2023-01-25 DIAGNOSIS — Z8673 Personal history of transient ischemic attack (TIA), and cerebral infarction without residual deficits: Secondary | ICD-10-CM | POA: Diagnosis not present

## 2023-01-25 LAB — RENAL FUNCTION PANEL
Albumin: 2.6 g/dL — ABNORMAL LOW (ref 3.5–5.0)
Anion gap: 6 (ref 5–15)
BUN: 40 mg/dL — ABNORMAL HIGH (ref 8–23)
CO2: 27 mmol/L (ref 22–32)
Calcium: 9 mg/dL (ref 8.9–10.3)
Chloride: 107 mmol/L (ref 98–111)
Creatinine, Ser: 1.29 mg/dL — ABNORMAL HIGH (ref 0.44–1.00)
GFR, Estimated: 40 mL/min — ABNORMAL LOW (ref >60)
Glucose, Bld: 113 mg/dL — ABNORMAL HIGH (ref 70–99)
Phosphorus: 2.7 mg/dL (ref 2.5–4.6)
Potassium: 4 mmol/L (ref 3.5–5.1)
Sodium: 140 mmol/L (ref 135–145)

## 2023-01-25 LAB — CBC
HCT: 36.2 % (ref 36.0–46.0)
Hemoglobin: 11.3 g/dL — ABNORMAL LOW (ref 12.0–15.0)
MCH: 27.5 pg (ref 26.0–34.0)
MCHC: 31.2 g/dL (ref 30.0–36.0)
MCV: 88.1 fL (ref 80.0–100.0)
Platelets: 131 10*3/uL — ABNORMAL LOW (ref 150–400)
RBC: 4.11 MIL/uL (ref 3.87–5.11)
RDW: 14.5 % (ref 11.5–15.5)
WBC: 12.9 10*3/uL — ABNORMAL HIGH (ref 4.0–10.5)
nRBC: 0 % (ref 0.0–0.2)

## 2023-01-25 LAB — LIPID PANEL
Cholesterol: 86 mg/dL (ref 0–200)
HDL: 46 mg/dL (ref >40)
LDL Cholesterol: 23 mg/dL (ref 0–99)
Total CHOL/HDL Ratio: 1.9 RATIO
Triglycerides: 86 mg/dL (ref <150)
VLDL: 17 mg/dL (ref 0–40)

## 2023-01-25 LAB — HEMOGLOBIN A1C
Hgb A1c MFr Bld: 8.5 % — ABNORMAL HIGH (ref 4.8–5.6)
Mean Plasma Glucose: 197.25 mg/dL

## 2023-01-25 LAB — PROCALCITONIN: Procalcitonin: 26.85 ng/mL

## 2023-01-25 LAB — GLUCOSE, CAPILLARY
Glucose-Capillary: 98 mg/dL (ref 70–99)
Glucose-Capillary: 142 mg/dL — ABNORMAL HIGH (ref 70–99)
Glucose-Capillary: 274 mg/dL — ABNORMAL HIGH (ref 70–99)
Glucose-Capillary: 230 mg/dL — ABNORMAL HIGH (ref 70–99)

## 2023-01-25 NOTE — Progress Notes (Signed)
  Transition of Care [facility]) Screening Note   Patient Details  Name: Linda Hess Date of Birth: August 29, 1931   Transition of Care [facility]) CM/SW Contact:    Quin Hoop, LCSW Phone Number: 01/25/2023, 9:41 AM    Transition of Care Department [facility]) has reviewed patient and no TOC needs have been identified at this time. We will continue to monitor patient advancement through interdisciplinary progression rounds. If new patient transition needs arise, please place a TOC consult.

## 2023-01-25 NOTE — NC FL2 (Signed)
[place] LEVEL OF CARE FORM     IDENTIFICATION  Patient Name: Linda Hess Birthdate: Oct 18, 1931 Sex: female Admission Date (Current Location): 01/24/2023  [facility] and [place] Number:  Engineering geologist and Address:  [facility], [address], [place], [place] [address]      Provider Number: [id number]  Attending Physician Name and Address:  Lucienne Minks, MD  Relative Name and Phone Number:  Liliane Channel, son, [phone]    Current Level of Care: Hospital Recommended Level of Care: [place] Prior Approval Number:    Date Approved/Denied:   PASRR Number: [id number]  Discharge Plan: SNF    Current Diagnoses: Patient Active Problem List   Diagnosis Date Noted   Aphasia 01/24/2023   Left lower lobe pneumonia 01/24/2023   AKI (acute kidney injury) ([place]) 01/24/2023   Leukocytosis 01/24/2023   Venous ulcer ([place]) 12/13/2022   Lymphedema 12/13/2022   Pressure injury of skin 10/22/2022   Acute on chronic diastolic CHF (congestive heart failure) ([place]) 10/21/2022   Fall 10/20/2022   Lymphedema of lower extremity 10/12/2022   Acute back pain with sciatica 10/06/2022   Right hip pain 10/06/2022   Lumbar spondylosis 10/06/2022   Spinal stenosis of lumbar region 10/06/2022   Diabetic peripheral neuropathy ([place] ) 03/24/2022   Foot pain 03/24/2022   Hallux valgus 03/24/2022   Ingrowing toenail 03/24/2022   Tibialis posterior tendinitis 03/24/2022   Leg swelling 07/24/2021   DVT (deep venous thrombosis) ([place]) 07/24/2021   Cellulitis of right upper extremity 12/14/2018   Peripheral arterial disease ([place]) 03/14/2014   CIDP (chronic inflammatory demyelinating polyneuropathy) ([place]) 03/14/2014   Mixed hyperlipidemia 03/12/2014   Essential hypertension, benign 03/12/2014   Type 2 diabetes mellitus with stage IIIb chronic kidney disease, without long-term current use of insulin ([place]) 03/12/2014    Orientation RESPIRATION  BLADDER Height & Weight     Self, Time, Situation, Place  O2 (2L) Incontinent, External catheter Weight: 140 lb (63.5 kg) Height:  5\' 6"  (167.6 cm)  BEHAVIORAL SYMPTOMS/MOOD NEUROLOGICAL BOWEL NUTRITION STATUS      Continent Diet  AMBULATORY STATUS COMMUNICATION OF NEEDS Skin   Limited Assist Verbally Normal                       Personal Care Assistance Level of Assistance  Bathing, Feeding, Dressing Bathing Assistance: Limited assistance Feeding assistance: Limited assistance Dressing Assistance: Limited assistance     Functional Limitations Info             [place]  PT (By licensed PT), OT (By licensed OT)     PT Frequency: 5x's/week OT Frequency: 5x's/week            Contractures Contractures Info: Not present    Additional Factors Info  Code Status, Allergies Code Status Info: full Allergies Info: Amoxicillin-pot Clavulanate, Penicillins, red meat           Current Medications (01/25/2023):  This is the current hospital active medication list Current Facility-Administered Medications  Medication Dose Route Frequency Provider Last Rate Last Admin   0.9 %  sodium chloride infusion   Intravenous Continuous Cox, Amy N, DO 100 mL/hr at 01/25/23 1534 New Bag at 01/25/23 1534   acetaminophen (TYLENOL) tablet 650 mg  650 mg Oral Q4H PRN Cox, Amy N, DO       Or   acetaminophen (TYLENOL) 160 MG/5ML solution 650 mg  650 mg Per Tube Q4H PRN Cox, Amy  N, DO       Or   acetaminophen (TYLENOL) suppository 650 mg  650 mg Rectal Q4H PRN Cox, Amy N, DO       azithromycin (ZITHROMAX) 500 mg in sodium chloride 0.9 % 250 mL IVPB  500 mg Intravenous Q24H Cox, Amy N, DO   Stopped at 01/24/23 2358   benzonatate (TESSALON) capsule 200 mg  200 mg Oral BID PRN Cox, Amy N, DO       calcium-vitamin D (OSCAL WITH D) 500-5 MG-MCG per tablet 1 tablet  1 tablet Oral QHS Cox, Amy N, DO   1 tablet at 01/24/23 2213   cefTRIAXone (ROCEPHIN) 2 g in sodium chloride  0.9 % 100 mL IVPB  2 g Intravenous Q24H Cox, Amy N, DO   Stopped at 01/24/23 2117   famotidine (PEPCID) tablet 20 mg  20 mg Oral BID Cox, Amy N, DO   20 mg at 01/25/23 9449   hydrALAZINE (APRESOLINE) injection 5 mg  5 mg Intravenous Q8H PRN Cox, Amy N, DO       HYDROcodone-acetaminophen (NORCO/VICODIN) 5-325 MG per tablet 1 tablet  1 tablet Oral Q8H PRN Cox, Amy N, DO   1 tablet at 01/25/23 0912   insulin aspart (novoLOG) injection 0-5 Units  0-5 Units Subcutaneous QHS Cox, Amy N, DO   4 Units at 01/24/23 2214   insulin aspart (novoLOG) injection 0-9 Units  0-9 Units Subcutaneous TID WC Cox, Amy N, DO   1 Units at 01/25/23 1212   melatonin tablet 5 mg  5 mg Oral QHS PRN Cox, Amy N, DO       oxybutynin (DITROPAN) tablet 2.5 mg  2.5 mg Oral BID Cox, Amy N, DO   2.5 mg at 01/25/23 0827   predniSONE (DELTASONE) tablet 10 mg  10 mg Oral Q breakfast Cox, Amy N, DO   10 mg at 01/25/23 6759   rivaroxaban (XARELTO) tablet 10 mg  10 mg Oral Q supper Cox, Amy N, DO       rosuvastatin (CRESTOR) tablet 10 mg  10 mg Oral Daily Cox, Amy N, DO   10 mg at 01/25/23 1638   senna-docusate (Senokot-S) tablet 1 tablet  1 tablet Oral QHS PRN Cox, Amy N, DO       traMADol (ULTRAM) tablet 50 mg  50 mg Oral Q8H PRN Cox, Amy N, DO         Discharge Medications: Please see discharge summary for a list of discharge medications.  Relevant Imaging Results:  Relevant Lab Results:   Additional Information ss#: [id number]  [place], LCSW

## 2023-01-25 NOTE — Progress Notes (Signed)
SLP Cancellation Note  Patient Details Name: Linda Hess MRN: [id number] DOB: [birth year]   Cancelled treatment:       Reason Eval/Treat Not Completed: SLP screened, no needs identified, will sign off  Met w/ pt and granddaughter in room during Lunch meal. Pt denied any difficulty swallowing and is currently on a regular diet; tolerates swallowing pills w/ water per NSG. Pt conversed in conversation w/out expressive/receptive deficits noted; pt denied any speech-language deficits. Speech clear. Good insight and awareness noted during conversation -- good sense of humor too.  No further skilled ST services indicated as pt appears at her baseline. Pt agreed. NSG to reconsult if any change in status while admitted.      Orinda Kenner, MS, CCC-SLP Speech Language Pathologist Rehab Services; [place] [phone] (ascom) Ronn Smolinsky 01/25/2023, 12:51 PM

## 2023-01-25 NOTE — Progress Notes (Signed)
  Progress Note   Patient: Linda Hess AJG:[id number] DOB: 14-Dec-1931 DOA: 01/24/2023     0 DOS: the patient was seen and examined on 01/25/2023   Brief hospital course:  Assessment and Plan: * AKI (acute kidney injury) ([place]) - Improved  - IV NS 100 cc/hr   Left lower lobe pneumonia - Azithromycin 500 mg daily  - IV ceftriaxone 2 g daily  - Tessalon capsule 200 mg PO bid PRN  - Prednisone 10 mg PO daily   Essential hypertension, benign - IV hydralazine 5 mg q8hr PRN   Mixed hyperlipidemia - Crestor 10 mg PO daily   Leukocytosis - Down trending   Aphasia - Resolved  - Brain MRI showed no acute CVA (but rather remote lacunar infarct at L lentiform nucleus/external capsule).  Right hip pain - Right hip x-ray were negative for acute right hip causes - Norco/vicodin 5-325 1 tab q8 hr PRN  - Tramadol 50 mg PO q8hr PRN   DM - Novolog SS ACHS  DVT Prophylaxis: Xarelto 10 mg PO daily  GI prophylaxis: Pepcid 20 mg PO bid       Subjective: Pt seen and examined at the bedside. WBC has downtrended 17 -->12.9. Mentation is back to baseline. Brain MRI showed no acute CVA (but rather remote lacunar infarct at L lentiform nucleus/external capsule).  Physical Exam: Vitals:   01/24/23 1938 01/24/23 2022 01/25/23 0344 01/25/23 0818  BP:  (!) 101/54 (!) 144/119 (!) 107/50  Pulse:  67 60 61  Resp:  18  18  Temp:  97.7 F (36.5 C)  97.7 F (36.5 C)  TempSrc:      SpO2: 95% (!) 71% 99% 97%  Weight:      Height:       Physical Exam Constitutional:      Appearance: Normal appearance.  HENT:     Head: Normocephalic and atraumatic.     Mouth/Throat:     Mouth: Mucous membranes are moist.  Cardiovascular:     Rate and Rhythm: Normal rate and regular rhythm.  Pulmonary:     Effort: Pulmonary effort is normal.     Breath sounds: Normal breath sounds.  Abdominal:     General: Abdomen is flat.     Palpations: Abdomen is soft.  Musculoskeletal:     Cervical back: Neck  supple.  Skin:    General: Skin is warm.  Neurological:     Mental Status: She is alert. Mental status is at baseline.  Psychiatric:        Mood and Affect: Mood normal.    Data Reviewed:   Family Communication: Son at the bedside   Disposition: Status is: Observation  Planned Discharge Destination: Home    Time spent: 35 minutes  Author: Lucienne Minks , MD 01/25/2023 9:48 AM  For on call review [email or web address].

## 2023-01-25 NOTE — Evaluation (Signed)
Physical Therapy Evaluation Patient Details Name: Linda Hess MRN: [id number] DOB: 01-06-1931 Today's Date: 01/25/2023  History of Present Illness  Pt is an 89 year old female with history of hypertension, hyperlipidemia, and DVT on Xarelto, who presents to the ED for chief concerns of expressive aphasia.  MD assessment includes: AKI, LLL PNA, leukocytosis, aphasia that has resolved, and R hip pain with negative imaging.  Per brain MRI impression, no acute intracranial abnormality.   Clinical Impression  Pt was pleasant and motivated to participate during the session and put forth good effort throughout. Pt required extra time and effort with bed mobility tasks but no physical assistance.  Pt did require physical assist and cuing for proper sequencing with both transfers and gait with pt only able to take several steps around the EOB and from the bed to chair.  Pt reported no adverse symptoms during the session with SpO2 and HR WNL on 2LO2/min.  Pt will benefit from PT services in a SNF setting upon discharge to safely address deficits listed in patient problem list for decreased caregiver assistance and eventual return to PLOF.         Recommendations for follow up therapy are one component of a multi-disciplinary discharge planning process, led by the attending physician.  Recommendations may be updated based on patient status, additional functional criteria and insurance authorization.  Follow Up Recommendations Skilled nursing-short term rehab (<3 hours/day) Can patient physically be transported by private vehicle: No    Assistance Recommended at Discharge Frequent or constant Supervision/Assistance  Patient can return home with the following  A lot of help with walking and/or transfers;A lot of help with bathing/dressing/bathroom;Assistance with cooking/housework;Direct supervision/assist for medications management;Assist for transportation;Help with stairs or ramp for entrance     Equipment Recommendations None recommended by PT  Recommendations for Other Services       Functional Status Assessment Patient has had a recent decline in their functional status and demonstrates the ability to make significant improvements in function in a reasonable and predictable amount of time.     Precautions / Restrictions Precautions Precautions: Fall Restrictions Weight Bearing Restrictions: No      Mobility  Bed Mobility Overal bed mobility: Modified Independent             General bed mobility comments: Min extra time and effort with use of bed rail during sup to sit    Transfers Overall transfer level: Needs assistance Equipment used: Rolling walker (2 wheels) Transfers: Sit to/from Stand Sit to Stand: Min assist           General transfer comment: Mod multi-modal cues for proper sequencing including hand placement and increased trunk flexion and cuing for more controlled eccentric phase    Ambulation/Gait Ambulation/Gait assistance: Min assist Gait Distance (Feet): 5 Feet Assistive device: Rolling walker (2 wheels) Gait Pattern/deviations: Step-through pattern, Decreased step length - right, Decreased step length - left, Trunk flexed Gait velocity: decreased     General Gait Details: Min A for stability and to guide the RW with cuing for general sequencing  Stairs            Wheelchair Mobility    Modified Rankin (Stroke Patients Only)       Balance Overall balance assessment: Needs assistance, History of Falls Sitting-balance support: Feet unsupported, Bilateral upper extremity supported Sitting balance-Leahy Scale: Good     Standing balance support: Bilateral upper extremity supported, During functional activity Standing balance-Leahy Scale: Poor  Pertinent Vitals/Pain Pain Assessment Pain Assessment: 0-10 Pain Score: 7  Pain Location: R hip Pain Descriptors / Indicators:  Sore Pain Intervention(s): Repositioned, Monitored during session, RN gave pain meds during session, Patient requesting pain meds-RN notified    Home Living Family/patient expects to be discharged to:: Private residence Living Arrangements: Alone Available Help at Discharge: Personal care attendant;Family Type of Home: House Home Access: Stairs to enter Entrance Stairs-Rails: Right Entrance Stairs-Number of Steps: 2   Home Layout: Two level;Able to live on main level with bedroom/bathroom Home Equipment: BSC/3in1;Rolling Walker (2 wheels);Wheelchair - manual Additional Comments: PCA 5x/wk for 2 hours/day    Prior Function Prior Level of Function : Needs assist;History of Falls (last six months)             Mobility Comments: Pt able to amb limited household distances, grossly 20-30 feet, with a RW, one fall in the last 6 months, uses a lift chair as needed, family assists pt up stairs into home in a w/c ADLs Comments: PCA and family assist with ADLs     Hand Dominance        Extremity/Trunk Assessment   Upper Extremity Assessment Upper Extremity Assessment: Generalized weakness    Lower Extremity Assessment Lower Extremity Assessment: Generalized weakness       Communication   Communication: No difficulties  Cognition Arousal/Alertness: Awake/alert Behavior During Therapy: WFL for tasks assessed/performed Overall Cognitive Status: Within Functional Limits for tasks assessed                                          General Comments      Exercises Total Joint Exercises Ankle Circles/Pumps: AROM, Strengthening, Both, 10 reps Straight Leg Raises: AAROM, Strengthening, Both, 5 reps Long Arc Quad: AROM, Strengthening, Both, 10 reps Knee Flexion: AROM, Strengthening, Both, 10 reps Marching in Standing: Strengthening, Both, 5 reps   Assessment/Plan    PT Assessment Patient needs continued PT services  PT Problem List Decreased  strength;Decreased activity tolerance;Decreased balance;Decreased mobility;Decreased knowledge of use of DME;Pain       PT Treatment Interventions DME instruction;Gait training;Stair training;Functional mobility training;Therapeutic activities;Therapeutic exercise;Balance training;Patient/family education    PT Goals (Current goals can be found in the Care Plan section)  Acute Rehab PT Goals Patient Stated Goal: To walk better PT Goal Formulation: With patient Time For Goal Achievement: 02/07/23 Potential to Achieve Goals: Good    Frequency Min 2X/week     Co-evaluation               AM-PAC PT "6 Clicks" Mobility  Outcome Measure Help needed turning from your back to your side while in a flat bed without using bedrails?: A Little Help needed moving from lying on your back to sitting on the side of a flat bed without using bedrails?: A Little Help needed moving to and from a bed to a chair (including a wheelchair)?: A Little Help needed standing up from a chair using your arms (e.g., wheelchair or bedside chair)?: A Little Help needed to walk in hospital room?: A Lot Help needed climbing 3-5 steps with a railing? : Total 6 Click Score: 15    End of Session Equipment Utilized During Treatment: Gait belt;Oxygen Activity Tolerance: Patient tolerated treatment well Patient left: in chair;with call bell/phone within reach;with chair alarm set;with family/visitor present Nurse Communication: Mobility status PT Visit Diagnosis: Unsteadiness on feet (R26.81);Difficulty in  walking, not elsewhere classified (R26.2);Muscle weakness (generalized) (M62.81);Pain Pain - Right/Left: Right Pain - part of body: Hip    Time: 1610-9604 PT Time Calculation (min) (ACUTE ONLY): 38 min   Charges:   PT Evaluation $PT Eval Moderate Complexity: 1 Mod PT Treatments $Therapeutic Exercise: 8-22 mins       D. Royetta Asal PT, DPT 01/25/23, 12:26 PM

## 2023-01-25 NOTE — Evaluation (Signed)
Occupational Therapy Evaluation Patient Details Name: ROSIELEE CORPORAN MRN: [id number] DOB: 05-Apr-1931 Today's Date: 01/25/2023   History of Present Illness Pt is an 89 year old female with history of hypertension, hyperlipidemia, and DVT on Xarelto, who presents to the ED for chief concerns of expressive aphasia.  MD assessment includes: AKI, LLL PNA, leukocytosis, aphasia that has resolved, and R hip pain with negative imaging.  Per brain MRI impression, no acute intracranial abnormality.   Clinical Impression   Patient received for OT evaluation. See flowsheet below for details of function. Pt received in recliner today; able to stand CGA from recliner chair at [facility]; unable to walk with PT earlier this morning, so deferred further mobility at this time 2/2 safety. Son in room during session and in agreement with short term rehab to increase strength prior to returning home. Patient will benefit from continued OT while in acute care.       Recommendations for follow up therapy are one component of a multi-disciplinary discharge planning process, led by the attending physician.  Recommendations may be updated based on patient status, additional functional criteria and insurance authorization.   Follow Up Recommendations  Skilled nursing-short term rehab (<3 hours/day)     Assistance Recommended at Discharge Frequent or constant Supervision/Assistance  Patient can return home with the following A little help with walking and/or transfers;A little help with bathing/dressing/bathroom;Assistance with cooking/housework;Direct supervision/assist for medications management;Direct supervision/assist for financial management;Assist for transportation;Help with stairs or ramp for entrance    Functional Status Assessment  Patient has had a recent decline in their functional status and demonstrates the ability to make significant improvements in function in a reasonable and predictable amount of time.   Equipment Recommendations  Other (comment) (defer to next venue of care)    Recommendations for Other Services       Precautions / Restrictions Precautions Precautions: Fall Restrictions Weight Bearing Restrictions: No      Mobility Bed Mobility                    Transfers Overall transfer level: Needs assistance Equipment used: Rolling walker (2 wheels) Transfers: Sit to/from Stand Sit to Stand: Min guard           General transfer comment: Sit to stand from recliner chair with RW with CGA.      Balance Overall balance assessment: History of Falls (Family states that pt has not fallen since October (last hospital admission))                                         ADL either performed or assessed with clinical judgement   ADL Overall ADL's : Needs assistance/impaired Eating/Feeding: Set up;Sitting   Grooming: Set up;Sitting Grooming Details (indicate cue type and reason): Pt requiring assistance for opening toothpaste container; able to brush dentures without assistance; spilled some of the water onto gown Upper Body Bathing: Set up;Sitting Upper Body Bathing Details (indicate cue type and reason): anticipated Lower Body Bathing: Min guard;Sitting/lateral leans Lower Body Bathing Details (indicate cue type and reason): anticipate needing steadying assistance when standing for bathing buttocks Upper Body Dressing : Set up;Sitting Upper Body Dressing Details (indicate cue type and reason): pt donned new gown today and able to thread BIL UE into arms Lower Body Dressing: Set up;Sit to/from stand;Min guard Lower Body Dressing Details (indicate cue type and reason): Pt demonstrated seated figure  four position today for simulated sock donning/doffing; would be able to do LB dress in seated/standing with CGA for safety at RW.   Toilet Transfer Details (indicate cue type and reason): not practiced today; anticipate MIN A RW SST t/f.    Toileting - Clothing Manipulation Details (indicate cue type and reason): not practiced today; anticipate CGA-MIN A   Tub/Shower Transfer Details (indicate cue type and reason): pt does not do this at home; sponge bathes at baseline. Functional mobility during ADLs: Min guard;Rolling walker (2 wheels) (pt able to stand at [facility] today with CGA from recliner chair) General ADL Comments: anticipate most ADLs needed to be from w/c or chair level at this time, which is not far from baseline.     Vision         Perception     Praxis      Pertinent Vitals/Pain Pain Assessment Pain Assessment: No/denies pain Pain Intervention(s): Limited activity within patient's tolerance     Hand Dominance Right   Extremity/Trunk Assessment Upper Extremity Assessment Upper Extremity Assessment: Generalized weakness;RUE deficits/detail (BIL hand strength WNL. Pt having difficulty opening toothpaste container and needing son's assistance.) RUE Deficits / Details: Pt noted to have some OA-appearing crackling sounds with attempted MMT in R shoulder.   Lower Extremity Assessment Lower Extremity Assessment: Generalized weakness       Communication Communication Communication: No difficulties   Cognition Arousal/Alertness: Awake/alert Behavior During Therapy: WFL for tasks assessed/performed Overall Cognitive Status: Within Functional Limits for tasks assessed                                 General Comments: Pt noted to have some inconsistencies corrected by son (such as that she went home from hospital in October rather than d/c to SNF rehab). Pt is very pleasant, and often deferring activities in attempt to make OT's life easier, rather than motivated to move more; pt appearing to not want to be a burden on anyone, including hospital staff. Anticipate may not call for assistance due to not wanting to be a burden.     General Comments       Exercises     Shoulder Instructions       Home Living Family/patient expects to be discharged to:: Private residence Living Arrangements: Alone Available Help at Discharge: Personal care attendant;Family Type of Home: House Home Access: Stairs to enter (per PT, pt/son report that family carries pt up the stairs in w/c.) Entrance Stairs-Number of Steps: 2 Entrance Stairs-Rails: Right Home Layout: Two level;Able to live on main level with bedroom/bathroom Alternate Level Stairs-Number of Steps: has to go up/down steps to get to her bathroom and she has not been able to do that recently. Has been using BSC   Bathroom Shower/Tub: Sponge bathes at baseline;Other (comment) (uses w/c and bathes at kitchen sink from seated)   Bathroom Toilet: Standard (in bathroom; pt typically uses BSC next to bed) Bathroom Accessibility: No (bathroom is not accessible given some stairs needed to reach it.)   Home Equipment: BSC/3in1;Rolling Walker (2 wheels);Wheelchair - manual   Additional Comments: PCA 5x/week for 4 hours daily (9am-1pm); family assists on weekends (pt has 4 sons; two live locally and the other two live about an hour away). Son in room today is very kind and supportive.      Prior Functioning/Environment Prior Level of Function : Needs assist;History of Falls (last six months)  Cognitive Assist :  ADLs (cognitive)   ADLs (Cognitive): Intermittent cues Physical Assist : Mobility (physical)     Mobility Comments: Pt able to amb limited household distances, grossly 20-30 feet, with a RW, one fall in the last 6 months, uses a lift chair as needed, family assists pt up stairs into home in a w/c ADLs Comments: PCA and family assist with IADLs. Pt does sponge bath, dressing, toileting (at [facility]), grooming independently. PCA present from 9am-1pm M-F; family present on the weekends.        OT Problem List: Decreased strength;Decreased activity tolerance;Impaired balance (sitting and/or standing);Decreased safety awareness      OT  Treatment/Interventions: Self-care/ADL training;Therapeutic exercise;DME and/or AE instruction;Therapeutic activities    OT Goals(Current goals can be found in the care plan section) Acute Rehab OT Goals Patient Stated Goal: Get better; go home OT Goal Formulation: With patient/family Time For Goal Achievement: 02/08/23 Potential to Achieve Goals: Good ADL Goals Pt Will Perform Grooming: Independently;standing Pt Will Transfer to Toilet: with modified independence;bedside commode Pt Will Perform Toileting - Clothing Manipulation and hygiene: with modified independence;sit to/from stand  OT Frequency: Min 2X/week    Co-evaluation              AM-PAC OT "6 Clicks" Daily Activity     Outcome Measure Help from another person eating meals?: None Help from another person taking care of personal grooming?: A Little Help from another person toileting, which includes using toliet, bedpan, or urinal?: A Lot Help from another person bathing (including washing, rinsing, drying)?: A Lot Help from another person to put on and taking off regular upper body clothing?: A Little Help from another person to put on and taking off regular lower body clothing?: A Little 6 Click Score: 17   End of Session Equipment Utilized During Treatment: Rolling walker (2 wheels) Nurse Communication: Mobility status  Activity Tolerance: Patient tolerated treatment well Patient left: in chair;with chair alarm set;with family/visitor present  OT Visit Diagnosis: Unsteadiness on feet (R26.81)                Time: 9326-7124 OT Time Calculation (min): 28 min Charges:  OT General Charges $OT Visit: 1 Visit OT Evaluation $OT Eval Moderate Complexity: 1 Mod OT Treatments $Self Care/Home Management : 8-22 mins  Waymon Amato, MS, OTR/L  Vania Rea 01/25/2023, 12:53 PM

## 2023-01-26 LAB — PHOSPHORUS: Phosphorus: 2.2 mg/dL — ABNORMAL LOW (ref 2.5–4.6)

## 2023-01-26 LAB — MAGNESIUM: Magnesium: 2 mg/dL (ref 1.7–2.4)

## 2023-01-26 LAB — COMPREHENSIVE METABOLIC PANEL
ALT: 15 U/L (ref 0–44)
AST: 19 U/L (ref 15–41)
Albumin: 2.6 g/dL — ABNORMAL LOW (ref 3.5–5.0)
Alkaline Phosphatase: 39 U/L (ref 38–126)
Anion gap: 7 (ref 5–15)
BUN: 35 mg/dL — ABNORMAL HIGH (ref 8–23)
CO2: 23 mmol/L (ref 22–32)
Calcium: 8.6 mg/dL — ABNORMAL LOW (ref 8.9–10.3)
Chloride: 109 mmol/L (ref 98–111)
Creatinine, Ser: 0.96 mg/dL (ref 0.44–1.00)
GFR, Estimated: 57 mL/min — ABNORMAL LOW (ref >60)
Glucose, Bld: 127 mg/dL — ABNORMAL HIGH (ref 70–99)
Potassium: 3.5 mmol/L (ref 3.5–5.1)
Sodium: 139 mmol/L (ref 135–145)
Total Bilirubin: 0.4 mg/dL (ref 0.3–1.2)
Total Protein: 5.3 g/dL — ABNORMAL LOW (ref 6.5–8.1)

## 2023-01-26 LAB — CBC
HCT: 35.4 % — ABNORMAL LOW (ref 36.0–46.0)
Hemoglobin: 11.1 g/dL — ABNORMAL LOW (ref 12.0–15.0)
MCH: 27.4 pg (ref 26.0–34.0)
MCHC: 31.4 g/dL (ref 30.0–36.0)
MCV: 87.4 fL (ref 80.0–100.0)
Platelets: 116 10*3/uL — ABNORMAL LOW (ref 150–400)
RBC: 4.05 MIL/uL (ref 3.87–5.11)
RDW: 14.3 % (ref 11.5–15.5)
WBC: 9.4 10*3/uL (ref 4.0–10.5)
nRBC: 0 % (ref 0.0–0.2)

## 2023-01-26 LAB — PROCALCITONIN: Procalcitonin: 14.44 ng/mL

## 2023-01-26 LAB — GLUCOSE, CAPILLARY
Glucose-Capillary: 93 mg/dL (ref 70–99)
Glucose-Capillary: 163 mg/dL — ABNORMAL HIGH (ref 70–99)
Glucose-Capillary: 241 mg/dL — ABNORMAL HIGH (ref 70–99)
Glucose-Capillary: 214 mg/dL — ABNORMAL HIGH (ref 70–99)

## 2023-01-26 LAB — C-REACTIVE PROTEIN: CRP: 14.1 mg/dL — ABNORMAL HIGH (ref <1.0)

## 2023-01-26 MED ORDER — MORPHINE SULFATE (PF) 2 MG/ML IV SOLN: 1.0000 mg | INTRAVENOUS | Status: DC | PRN

## 2023-01-26 MED ORDER — ACETAMINOPHEN 10 MG/ML IV SOLN
1000.0000 mg | INTRAVENOUS | Status: AC
  Administered 2023-01-26: 1000 mg via INTRAVENOUS
  Filled 2023-01-26: qty 100

## 2023-01-26 MED ORDER — AZITHROMYCIN 500 MG PO TABS
500.0000 mg | ORAL_TABLET | Freq: Every day | ORAL | Status: DC
  Administered 2023-01-26 – 2023-01-27 (×2): 500 mg via ORAL
  Filled 2023-01-26 (×2): qty 1

## 2023-01-26 MED ORDER — POTASSIUM PHOSPHATES 15 MMOLE/5ML IV SOLN
15.0000 mmol | Freq: Once | INTRAVENOUS | Status: AC
  Administered 2023-01-26: 15 mmol via INTRAVENOUS
  Filled 2023-01-26: qty 5

## 2023-01-26 MED ORDER — HYDROCODONE-ACETAMINOPHEN 5-325 MG PO TABS: 1.0000 | ORAL_TABLET | Freq: Three times a day (TID) | ORAL | Status: DC | PRN

## 2023-01-26 NOTE — Progress Notes (Signed)
PHARMACIST - PHYSICIAN COMMUNICATION  CONCERNING: Antibiotic IV to Oral Route Change Policy  RECOMMENDATION: This patient is receiving azithromycin by the intravenous route.  Based on criteria approved by the Pharmacy and Therapeutics Committee, the antibiotic(s) is/are being converted to the equivalent oral dose form(s).   DESCRIPTION: These criteria include: Patient being treated for a respiratory tract infection, urinary tract infection, cellulitis or clostridium difficile associated diarrhea if on metronidazole The patient is not neutropenic and does not exhibit a GI malabsorption state The patient is eating (either orally or via tube) and/or has been taking other orally administered medications for a least 24 hours The patient is improving clinically and has a Tmax < 100.5  If you have questions about this conversion, please contact the [place] 01/26/23

## 2023-01-26 NOTE — Progress Notes (Signed)
Occupational Therapy Treatment Patient Details Name: Linda Hess MRN: [id number] DOB: October 25, 1931 Today's Date: 01/26/2023   History of present illness Pt is an 89 year old female with history of hypertension, hyperlipidemia, and DVT on Xarelto, who presents to the ED for chief concerns of expressive aphasia.  MD assessment includes: AKI, LLL PNA, leukocytosis, aphasia that has resolved, and R hip pain with negative imaging.  Per brain MRI impression, no acute intracranial abnormality.   OT comments  Upon entering session, pt sitting up in bed and agreeable to OT. Son present. Attempting to have pt complete pill box test, however, deferred 2/2 son reporting that pt receives medication pre-packaged from pharmacist for each day of the week. Pt completed SLUMS examination this date scoring 10/30 indicating a positive screen for dementia. Of note, it is not within occupational therapy scope of practice to diagnose cognitive impairments, this screen indicates need for further testing. The SLUMS is a 30 point, 11 question screening questionnaire that tests orientation, memory, attention, and executive function. Pt with noted impairments in short term memory, problem solving, and attention. Recommendation for SNF and 24/7 supervision/assistance upon discharge remains appropriate. OT will continue to follow acutely.    Recommendations for follow up therapy are one component of a multi-disciplinary discharge planning process, led by the attending physician.  Recommendations may be updated based on patient status, additional functional criteria and insurance authorization.    Follow Up Recommendations  Skilled nursing-short term rehab (<3 hours/day)     Assistance Recommended at Discharge Frequent or constant Supervision/Assistance  Patient can return home with the following  A little help with walking and/or transfers;A little help with bathing/dressing/bathroom;Assistance with cooking/housework;Direct  supervision/assist for medications management;Direct supervision/assist for financial management;Assist for transportation;Help with stairs or ramp for entrance   Equipment Recommendations  Other (comment) (defer to next venue of care)    Recommendations for Other Services      Precautions / Restrictions Precautions Precautions: Fall Restrictions Weight Bearing Restrictions: No       Mobility Bed Mobility               General bed mobility comments: pt deferred    Transfers                         Balance Overall balance assessment: History of Falls, Needs assistance     Sitting balance - Comments: pt deferred                                   ADL either performed or assessed with clinical judgement   ADL Overall ADL's : Needs assistance/impaired                                       General ADL Comments: Pt deferred grooming/toileting tasks 2/2 lunch tray being delivered.    Extremity/Trunk Assessment Upper Extremity Assessment Upper Extremity Assessment: Generalized weakness   Lower Extremity Assessment Lower Extremity Assessment: Generalized weakness        Vision Patient Visual Report: No change from baseline     Perception     Praxis      Cognition Arousal/Alertness: Awake/alert Behavior During Therapy: WFL for tasks assessed/performed Overall Cognitive Status: History of cognitive impairments - at baseline  General Comments: Son reports pt recently got pain meds for R hip pain. He stated since then pt has "been a little off", however, then reports that performance during standardized assessment is accurate of what pt's cognition is like at baseline. Pt demonstrated slow processing during assessment and asked for several questions to be repeated.         Exercises Other Exercises Other Exercises: Discussed medication management, SLUMS completed     Shoulder Instructions       General Comments      Pertinent Vitals/ Pain       Pain Assessment Pain Assessment: No/denies pain  Home Living                                          Prior Functioning/Environment              Frequency  Min 2X/week        Progress Toward Goals  OT Goals(current goals can now be found in the care plan section)  Progress towards OT goals: Progressing toward goals  Acute Rehab OT Goals Patient Stated Goal: Get better; go home OT Goal Formulation: With patient/family Time For Goal Achievement: 02/08/23 Potential to Achieve Goals: Good  Plan Discharge plan remains appropriate;Frequency remains appropriate    Co-evaluation                 AM-PAC OT "6 Clicks" Daily Activity     Outcome Measure   Help from another person eating meals?: None Help from another person taking care of personal grooming?: A Little Help from another person toileting, which includes using toliet, bedpan, or urinal?: A Lot Help from another person bathing (including washing, rinsing, drying)?: A Lot Help from another person to put on and taking off regular upper body clothing?: A Little Help from another person to put on and taking off regular lower body clothing?: A Little 6 Click Score: 17    End of Session    OT Visit Diagnosis: Unsteadiness on feet (R26.81);Muscle weakness (generalized) (M62.81);Pain Pain - Right/Left: Right Pain - part of body: Hip   Activity Tolerance Patient tolerated treatment well   Patient Left in bed;with call bell/phone within reach;with bed alarm set;with family/visitor present   Nurse Communication Mobility status        Time: 7829-5621 OT Time Calculation (min): 15 min  Charges: OT General Charges $OT Visit: 1 Visit OT Treatments $Therapeutic Activity: 8-22 mins  [facility] MS, OTR/L ascom [phone]  01/26/23, 4:15 PM

## 2023-01-26 NOTE — Progress Notes (Signed)
Recommendation for SNF after initial assessment.  CSW spoke with patient and son at bedside.  Bed search began.  Will present offers.  FL2 and PASRR completed 01/25/23.

## 2023-01-26 NOTE — Plan of Care (Signed)
  Problem: Education: Goal: Knowledge of disease or condition will improve Outcome: Progressing Goal: Knowledge of secondary prevention will improve  Outcome: Progressing Goal: Knowledge of patient specific risk factors will improve  Outcome: Progressing   Problem: Ischemic Stroke/TIA Tissue Perfusion: Goal: Complications of ischemic stroke/TIA will be minimized Outcome: Progressing   Problem: Coping: Goal: Will verbalize positive feelings about self Outcome: Progressing Goal: Will identify appropriate support needs Outcome: Progressing   Problem: Health Behavior/Discharge Planning: Goal: Ability to manage health-related needs will improve Outcome: Progressing Goal: Goals will be collaboratively established with patient/family Outcome: Progressing   Problem: Self-Care: Goal: Ability to participate in self-care as condition permits will improve Outcome: Progressing Goal: Verbalization of feelings and concerns over difficulty with self-care will improve Outcome: Progressing Goal: Ability to communicate needs accurately will improve Outcome: Progressing   Problem: Nutrition: Goal: Risk of aspiration will decrease Outcome: Progressing Goal: Dietary intake will improve Outcome: Progressing   Problem: Education: Goal: Ability to describe self-care measures that may prevent or decrease complications (Diabetes Survival Skills Education) will improve Outcome: Progressing Goal: Individualized Educational Video(s) Outcome: Progressing   Problem: Coping: Goal: Ability to adjust to condition or change in health will improve Outcome: Progressing   Problem: Fluid Volume: Goal: Ability to maintain a balanced intake and output will improve Outcome: Progressing   Problem: Health Behavior/Discharge Planning: Goal: Ability to identify and utilize available resources and services will improve Outcome: Progressing Goal: Ability to manage health-related needs will improve Outcome:  Progressing   Problem: Metabolic: Goal: Ability to maintain appropriate glucose levels will improve Outcome: Progressing   Problem: Nutritional: Goal: Maintenance of adequate nutrition will improve Outcome: Progressing Goal: Progress toward achieving an optimal weight will improve Outcome: Progressing   Problem: Skin Integrity: Goal: Risk for impaired skin integrity will decrease Outcome: Progressing   Problem: Tissue Perfusion: Goal: Adequacy of tissue perfusion will improve Outcome: Progressing   Problem: Education: Goal: Knowledge of General Education information will improve Description: Including pain rating scale, medication(s)/side effects and non-pharmacologic comfort measures Outcome: Progressing   Problem: Health Behavior/Discharge Planning: Goal: Ability to manage health-related needs will improve Outcome: Progressing   Problem: Clinical Measurements: Goal: Ability to maintain clinical measurements within normal limits will improve Outcome: Progressing Goal: Will remain free from infection Outcome: Progressing Goal: Diagnostic test results will improve Outcome: Progressing Goal: Respiratory complications will improve Outcome: Progressing Goal: Cardiovascular complication will be avoided Outcome: Progressing   Problem: Activity: Goal: Risk for activity intolerance will decrease Outcome: Progressing   Problem: Nutrition: Goal: Adequate nutrition will be maintained Outcome: Progressing   Problem: Coping: Goal: Level of anxiety will decrease Outcome: Progressing   Problem: Elimination: Goal: Will not experience complications related to bowel motility Outcome: Progressing Goal: Will not experience complications related to urinary retention Outcome: Progressing   Problem: Pain Managment: Goal: General experience of comfort will improve Outcome: Progressing   Problem: Safety: Goal: Ability to remain free from injury will improve Outcome:  Progressing   Problem: Skin Integrity: Goal: Risk for impaired skin integrity will decrease Outcome: Progressing

## 2023-01-26 NOTE — Progress Notes (Addendum)
  Progress Note   Patient: Linda Hess MVE:[id number] DOB: Apr 04, 1931 DOA: 01/24/2023     1 DOS: the patient was seen and examined on 01/26/2023   Brief hospital course:  Assessment and Plan:  * AKI (acute kidney injury) ([employer]) - Resolved    Left lower lobe pneumonia - PO Azithromycin 500 mg daily  - IV ceftriaxone 2 g daily  - Tessalon capsule 200 mg PO bid PRN  - Prednisone 10 mg PO daily    Essential hypertension, benign - IV hydralazine 5 mg q8hr PRN    Mixed hyperlipidemia - Crestor 10 mg PO daily    Leukocytosis - Resolved    Aphasia - Resolved  - Brain MRI showed no acute CVA (but rather remote lacunar infarct at L lentiform nucleus/external capsule) - Crestor 10 mg PO daily  - TIA not suspected, due to the pt's simultaneous metabolic encephalopathy   Right hip pain - Right hip x-ray were negative for acute right hip causes - Norco/vicodin 5-325 1 tab q8 hr PRN (moderate) - IV morphine 1 mg q4 hr PRN (severe) - Senna-S 10 mg PO daily PRN  - Pt did well with 1 time IV tylenol on 01/26/2023 - Family requesting SNF, case manager working on this    DM - Novolog SS ACHS  CIDP - Complicating care. Not actively being treated    DVT Prophylaxis: Xarelto 10 mg PO daily  GI prophylaxis: Pepcid 20 mg PO bid       Subjective: Pt seen and examined at the bedside. WBC now wnl. This morning the pt was having hip pain (similar to her ED presentation). She responded well to IV tylenol. Pain regimen now include PO norco/vicodin and IV morphine. Case manager working on SNF placement.   Physical Exam: Vitals:   01/25/23 1800 01/25/23 2006 01/25/23 2347 01/26/23 0521  BP: 136/65 133/62 (!) 143/88 (!) 148/67  Pulse: 78 (!) 58 67 74  Resp:  18 18 18   Temp: (!) 97.4 F (36.3 C) 97.9 F (36.6 C) 97.8 F (36.6 C) 98.3 F (36.8 C)  TempSrc:  Oral Oral Oral  SpO2: 96% 100% 97% 97%  Weight:      Height:       Constitutional:      Appearance: Normal appearance.   HENT:     Head: Normocephalic and atraumatic.     Mouth/Throat:     Mouth: Mucous membranes are moist.  Cardiovascular:     Rate and Rhythm: Normal rate and regular rhythm.  Pulmonary:     Effort: Pulmonary effort is normal.     Breath sounds: Normal breath sounds.  Abdominal:     General: Abdomen is flat.     Palpations: Abdomen is soft.  Musculoskeletal:     Cervical back: Neck supple.  Skin:    General: Skin is warm.  Neurological:     Mental Status: She is alert. Mental status is at baseline.  Psychiatric:        Mood and Affect: Mood normal.   Data Reviewed:   Disposition: Status is: Inpatient  Planned Discharge Destination: Skilled nursing facility    Time spent: 35 minutes  Author: Lucienne Minks , MD 01/26/2023 10:40 AM  For on call review [email or web address].

## 2023-01-27 DIAGNOSIS — G9341 Metabolic encephalopathy: Secondary | ICD-10-CM

## 2023-01-27 DIAGNOSIS — J189 Pneumonia, unspecified organism: Secondary | ICD-10-CM | POA: Diagnosis not present

## 2023-01-27 DIAGNOSIS — I5032 Chronic diastolic (congestive) heart failure: Secondary | ICD-10-CM | POA: Diagnosis not present

## 2023-01-27 DIAGNOSIS — N179 Acute kidney failure, unspecified: Secondary | ICD-10-CM | POA: Diagnosis not present

## 2023-01-27 LAB — COMPREHENSIVE METABOLIC PANEL
ALT: 15 U/L (ref 0–44)
AST: 17 U/L (ref 15–41)
Albumin: 2.7 g/dL — ABNORMAL LOW (ref 3.5–5.0)
Alkaline Phosphatase: 38 U/L (ref 38–126)
Anion gap: 9 (ref 5–15)
BUN: 27 mg/dL — ABNORMAL HIGH (ref 8–23)
CO2: 24 mmol/L (ref 22–32)
Calcium: 9.1 mg/dL (ref 8.9–10.3)
Chloride: 108 mmol/L (ref 98–111)
Creatinine, Ser: 0.89 mg/dL (ref 0.44–1.00)
GFR, Estimated: >60 mL/min (ref >60)
Glucose, Bld: 111 mg/dL — ABNORMAL HIGH (ref 70–99)
Potassium: 3.8 mmol/L (ref 3.5–5.1)
Sodium: 141 mmol/L (ref 135–145)
Total Bilirubin: 0.8 mg/dL (ref 0.3–1.2)
Total Protein: 5.4 g/dL — ABNORMAL LOW (ref 6.5–8.1)

## 2023-01-27 LAB — CBC
HCT: 36.3 % (ref 36.0–46.0)
Hemoglobin: 11.5 g/dL — ABNORMAL LOW (ref 12.0–15.0)
MCH: 27.6 pg (ref 26.0–34.0)
MCHC: 31.7 g/dL (ref 30.0–36.0)
MCV: 87.1 fL (ref 80.0–100.0)
Platelets: 130 10*3/uL — ABNORMAL LOW (ref 150–400)
RBC: 4.17 MIL/uL (ref 3.87–5.11)
RDW: 14.4 % (ref 11.5–15.5)
WBC: 8.7 10*3/uL (ref 4.0–10.5)
nRBC: 0 % (ref 0.0–0.2)

## 2023-01-27 LAB — GLUCOSE, CAPILLARY
Glucose-Capillary: 100 mg/dL — ABNORMAL HIGH (ref 70–99)
Glucose-Capillary: 277 mg/dL — ABNORMAL HIGH (ref 70–99)
Glucose-Capillary: 254 mg/dL — ABNORMAL HIGH (ref 70–99)
Glucose-Capillary: 223 mg/dL — ABNORMAL HIGH (ref 70–99)

## 2023-01-27 LAB — C-REACTIVE PROTEIN: CRP: 8.7 mg/dL — ABNORMAL HIGH (ref <1.0)

## 2023-01-27 LAB — PHOSPHORUS: Phosphorus: 2.7 mg/dL (ref 2.5–4.6)

## 2023-01-27 LAB — BRAIN NATRIURETIC PEPTIDE: B Natriuretic Peptide: 803.7 pg/mL — ABNORMAL HIGH (ref 0.0–100.0)

## 2023-01-27 LAB — MAGNESIUM: Magnesium: 1.8 mg/dL (ref 1.7–2.4)

## 2023-01-27 MED ORDER — ASPIRIN 81 MG PO TBEC: 81.0000 mg | DELAYED_RELEASE_TABLET | Freq: Every day | ORAL | Status: DC

## 2023-01-27 MED ORDER — LIDOCAINE 5 % EX PTCH
1.0000 | MEDICATED_PATCH | CUTANEOUS | Status: DC
  Administered 2023-01-27 – 2023-01-28 (×2): 1 via TRANSDERMAL
  Filled 2023-01-27 (×2): qty 1

## 2023-01-27 MED ORDER — CEFUROXIME AXETIL 500 MG PO TABS
500.0000 mg | ORAL_TABLET | Freq: Two times a day (BID) | ORAL | Status: DC
  Administered 2023-01-27 – 2023-01-28 (×2): 500 mg via ORAL
  Filled 2023-01-27 (×3): qty 1

## 2023-01-27 MED ORDER — ASPIRIN 81 MG PO TBEC
81.0000 mg | DELAYED_RELEASE_TABLET | ORAL | Status: DC
  Administered 2023-01-27: 81 mg via ORAL
  Filled 2023-01-27: qty 1

## 2023-01-27 NOTE — Progress Notes (Signed)
Physical Therapy Treatment Patient Details Name: NIDHI JACOME MRN: [id number] DOB: 12/25/1931 Today's Date: 01/27/2023   History of Present Illness Pt is an 89 year old female with history of hypertension, hyperlipidemia, and DVT on Xarelto, who presents to the ED for chief concerns of expressive aphasia.  MD assessment includes: AKI, LLL PNA, leukocytosis, aphasia that has resolved, and R hip pain with negative imaging.  Per brain MRI impression, no acute intracranial abnormality.    PT Comments    Pt was pleasant and motivated to participate during the session and put forth good effort throughout. Pt found on room air with SpO2 and HR WNL and both remained WNL measured frequently during the session.  Pt required no physical assistance with bed mobility tasks and demonstrated good static sitting balance at the EOB.  Pt was able to perform multiple sit to/from stand transfers but needed the EOB to raised along with min A and cues for sequencing.  Pt performed multiple bouts of static standing with min to mod lean on the RW for support but with no LOB.  Pt did require min A for stability during ambulation near the EOB and from bed to chair.  Pt will benefit from PT services in a SNF setting upon discharge to safely address deficits listed in patient problem list for decreased caregiver assistance and eventual return to PLOF.     Recommendations for follow up therapy are one component of a multi-disciplinary discharge planning process, led by the attending physician.  Recommendations may be updated based on patient status, additional functional criteria and insurance authorization.  Follow Up Recommendations  Skilled nursing-short term rehab (<3 hours/day) Can patient physically be transported by private vehicle: No   Assistance Recommended at Discharge Frequent or constant Supervision/Assistance  Patient can return home with the following A lot of help with walking and/or transfers;A lot of  help with bathing/dressing/bathroom;Assistance with cooking/housework;Direct supervision/assist for medications management;Assist for transportation;Help with stairs or ramp for entrance   Equipment Recommendations  None recommended by PT    Recommendations for Other Services       Precautions / Restrictions Precautions Precautions: Fall Restrictions Weight Bearing Restrictions: No     Mobility  Bed Mobility Overal bed mobility: Modified Independent             General bed mobility comments: Min extra time and effort only    Transfers Overall transfer level: Needs assistance Equipment used: Rolling walker (2 wheels) Transfers: Sit to/from Stand Sit to Stand: Min assist, From elevated surface           General transfer comment: Min verbal cues for hand placement and increased trunk flexion with min A to come to standing from an elevated surface    Ambulation/Gait Ambulation/Gait assistance: Min assist Gait Distance (Feet): 3 Feet x 2 Assistive device: Rolling walker (2 wheels) Gait Pattern/deviations: Decreased step length - right, Decreased step length - left, Trunk flexed, Antalgic, Step-to pattern Gait velocity: decreased     General Gait Details: Pt able to take several effortful, shuffling steps at the EOB and then from bed to chair with Min A for stability and to guide the RW   Stairs             Wheelchair Mobility    Modified Rankin (Stroke Patients Only)       Balance Overall balance assessment: History of Falls, Needs assistance   Sitting balance-Leahy Scale: Good Sitting balance - Comments: pt deferred   Standing balance support: Bilateral upper  extremity supported, During functional activity Standing balance-Leahy Scale: Poor                              Cognition Arousal/Alertness: Awake/alert Behavior During Therapy: WFL for tasks assessed/performed Overall Cognitive Status: History of cognitive impairments - at  baseline                                          Exercises Total Joint Exercises Ankle Circles/Pumps: AROM, Strengthening, Both, 10 reps Hip ABduction/ADduction: Strengthening, Both, 5 reps Straight Leg Raises: Strengthening, Both, 5 reps Long Arc Quad: AROM, Strengthening, Both, 10 reps Knee Flexion: AROM, Strengthening, Both, 10 reps Marching in Standing: Strengthening, Both, 5 reps, Standing Other Exercises Other Exercises: Unsupported sitting with reaching outside BOS for core therex Other Exercises: Static standing at the EOB 3 x 4 min for improved activity tolerance    General Comments        Pertinent Vitals/Pain Pain Assessment Pain Assessment: No/denies pain    Home Living                          Prior Function            PT Goals (current goals can now be found in the care plan section) Progress towards PT goals: Progressing toward goals    Frequency    Min 2X/week      PT Plan Current plan remains appropriate    Co-evaluation              AM-PAC PT "6 Clicks" Mobility   Outcome Measure  Help needed turning from your back to your side while in a flat bed without using bedrails?: A Little Help needed moving from lying on your back to sitting on the side of a flat bed without using bedrails?: A Little Help needed moving to and from a bed to a chair (including a wheelchair)?: A Little Help needed standing up from a chair using your arms (e.g., wheelchair or bedside chair)?: A Little Help needed to walk in hospital room?: A Lot Help needed climbing 3-5 steps with a railing? : Total 6 Click Score: 15    End of Session Equipment Utilized During Treatment: Gait belt Activity Tolerance: Patient tolerated treatment well Patient left: in chair;with call bell/phone within reach;with chair alarm set;with nursing/sitter in room Nurse Communication: Mobility status PT Visit Diagnosis: Unsteadiness on feet (R26.81);Difficulty  in walking, not elsewhere classified (R26.2);Muscle weakness (generalized) (M62.81);Pain Pain - Right/Left: Right Pain - part of body: Hip     Time: 2376-2831 PT Time Calculation (min) (ACUTE ONLY): 23 min  Charges:  $Therapeutic Exercise: 8-22 mins $Therapeutic Activity: 8-22 mins                    D. Scott Sharyon Peitz PT, DPT 01/27/23, 3:22 PM

## 2023-01-27 NOTE — Assessment & Plan Note (Signed)
Echocardiogram in October noted grade 2 diastolic dysfunction.  Checking BNP.

## 2023-01-27 NOTE — Progress Notes (Signed)
Mobility Specialist - Progress Note   01/27/23 1116  Mobility  Activity Transferred from bed to chair;Ambulated with assistance in room  Level of Assistance Standby assist, set-up cues, supervision of patient - no hands on  Assistive Device Front wheel walker  Distance Ambulated (ft) 4 ft  Activity Response Tolerated well  Mobility Referral Yes  $Mobility charge 1 Mobility   MS responding to bed alarm. Pt sitting EOB upon entry utilizing RA. Pt STS to RW MinA, min VC for hand placement. Pt stood EOB and donned clothing articles. Pt amb to the recliner SBA, left with alarm set and needs within reach. Family member present at bedside.   Candie Mile Mobility Specialist 01/27/23 11:20 AM

## 2023-01-27 NOTE — Progress Notes (Addendum)
CSW visited pt in room.  Son, Liliane Channel, at bedside.  SNF bed offers delivered to them.  They will make a decision by this afternoon or tomorrow morning.  2:26  Spoke with pt's son, Kasandra Knudsen.  The family has chosen for pt to go to [facility].  Auth started.

## 2023-01-27 NOTE — Assessment & Plan Note (Addendum)
Uncontrolled, A1c at 8.5.  Patient would benefit from increasing metformin from 500 to 1000.

## 2023-01-27 NOTE — Progress Notes (Signed)
[facility] Progress Note  Patient: Linda Hess    ZOX:[id number]  DOA: 01/24/2023    Date of Service: the patient was seen and examined on 01/27/2023  Brief hospital course: 89 year old female with past medical history of hypertension and hyperlipidemia plus DVT on Xarelto who presented to the emergency room on 1/28 with slurred speech and more difficult to awaken.  Also complaining of some chronic left hip pain.  Workup revealed significant pneumonia with procalcitonin of 26 and lactic acid level of 3.9.  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  Patient put on IV fluids and antibiotics.  Following treatment, by that evening, speech issues have resolved.  Patient evaluated by physical therapy recommended skilled nursing.  Hip x-rays done noted chronic DJD.     Assessment and Plan: * AKI (acute kidney injury) (HCC)-resolved as of 01/27/2023 At baseline CKD stage IIIa.  Resolved with IV fluids.  Creatinine currently better than baseline  Left lower lobe pneumonia Continue antibiotics.  Trending down procalcitonin.  White blood cell count normalized.  Sepsis ruled out given only 1 SIRS criteria.  Acute metabolic encephalopathy-resolved as of 01/27/2023 Given description of possible aphasia, concerns for possible TIA given improvement within less than 24 hours, and no new findings, but old infarct noted on MRI.  That said, blood pressure not elevated and actually low on admission plus acute kidney injury and significant pneumonia on admission that improved with IV fluids and antibiotics, at this time, not recommending any intervention and feel that her symptoms are more due to metabolic encephalopathy brought on by pneumonia.  Chronic diastolic CHF (congestive heart failure) (HCC) Echocardiogram in October noted grade 2 diastolic dysfunction.  Checking BNP.  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  Patient would probably benefit from increasing metformin from 500 to 1000.  Right hip pain Suspect secondary to osteoarthritis.  As needed tramadol.  Have added lidocaine patch.  Seen by PT who are recommending skilled nursing.  X-rays unrevealing.  Mixed hyperlipidemia - Rosuvastatin 10 mg daily resumed       Body mass index is 22.6 kg/m.      Consultants: None  Procedures: None  Antimicrobials: IV Rocephin changed over to p.o. Ceftin IV Zithromax changed to p.o.  Code Status: Full code   Subjective: Patient complains of some mild hip pain, but otherwise okay  Objective: Vital signs were reviewed and unremarkable. Vitals:   01/27/23 1232 01/27/23 1536  BP: 138/61 (!) 181/129  Pulse: 83 72  Resp: 18 16  Temp: 98.1 F (36.7 C) 99.4 F (37.4 C)  SpO2: 98% 96%    Intake/Output Summary (Last 24 hours) at 01/27/2023 1546 Last data filed at 01/27/2023 1100 Gross per 24 hour  Intake 316.8 ml  Output 2300 ml  Net -1983.2 ml   Filed Weights   01/24/23 1352  Weight: 63.5 kg   Body mass index is 22.6 kg/m.  Exam:  General: Alert and oriented x 2, no acute distress HEENT: Normocephalic, atraumatic, mucous membranes are moist Cardiovascular: Regular rate and rhythm, S1-S2 Respiratory: Decreased breath sounds right base Abdomen: Soft, nontender, nondistended, positive bowel sounds Musculoskeletal: No clubbing or cyanosis or edema Skin: No skin breaks, tears or lesions Psychiatry: Appropriate, no evidence of psychoses Neurology: No focal deficits  Data  Reviewed: Albumin 2.7.  CRP of 8.7.  Disposition:  Status is: Inpatient Remains inpatient appropriate because:  -Needs skilled nursing -Follow-up on BNP    Anticipated discharge date: 2/1  Family Communication: Son at bedside DVT  Prophylaxis: rivaroxaban (XARELTO) tablet 10 mg Start: 01/25/23 1700 SCD's Start: 01/24/23 1856 rivaroxaban (XARELTO) tablet 10 mg    Author: Annita Brod ,MD 01/27/2023 3:46 PM  To reach On-call, see care teams to locate the attending and reach out via [email or web address]. Between 7PM-7AM, please contact night-coverage If you still have difficulty reaching the attending provider, please page the [facility] (Director on Call) for [facility] on amion for assistance.

## 2023-01-28 DIAGNOSIS — M25551 Pain in right hip: Secondary | ICD-10-CM | POA: Diagnosis not present

## 2023-01-28 DIAGNOSIS — I5033 Acute on chronic diastolic (congestive) heart failure: Secondary | ICD-10-CM

## 2023-01-28 DIAGNOSIS — N179 Acute kidney failure, unspecified: Secondary | ICD-10-CM | POA: Diagnosis not present

## 2023-01-28 DIAGNOSIS — J189 Pneumonia, unspecified organism: Secondary | ICD-10-CM | POA: Diagnosis not present

## 2023-01-28 LAB — GLUCOSE, CAPILLARY
Glucose-Capillary: 83 mg/dL (ref 70–99)
Glucose-Capillary: 174 mg/dL — ABNORMAL HIGH (ref 70–99)

## 2023-01-28 LAB — PROCALCITONIN: Procalcitonin: 4.13 ng/mL

## 2023-01-28 MED ORDER — FUROSEMIDE 20 MG PO TABS: 20.0000 mg | ORAL_TABLET | Freq: Every day | ORAL | 0 refills | Status: DC

## 2023-01-28 MED ORDER — FUROSEMIDE 10 MG/ML IJ SOLN
40.0000 mg | Freq: Once | INTRAMUSCULAR | Status: AC
  Administered 2023-01-28: 40 mg via INTRAVENOUS
  Filled 2023-01-28: qty 4

## 2023-01-28 MED ORDER — TRAMADOL HCL 50 MG PO TABS: 50.0000 mg | ORAL_TABLET | Freq: Three times a day (TID) | ORAL | 0 refills | Status: DC | PRN

## 2023-01-28 MED ORDER — AZITHROMYCIN 500 MG PO TABS: 500.0000 mg | ORAL_TABLET | Freq: Every day | ORAL | 0 refills | Status: DC

## 2023-01-28 MED ORDER — CEFUROXIME AXETIL 500 MG PO TABS: 500.0000 mg | ORAL_TABLET | Freq: Two times a day (BID) | ORAL | 0 refills | Status: DC

## 2023-01-28 MED ORDER — POTASSIUM CHLORIDE CRYS ER 20 MEQ PO TBCR
40.0000 meq | EXTENDED_RELEASE_TABLET | Freq: Once | ORAL | Status: AC
  Administered 2023-01-28: 40 meq via ORAL
  Filled 2023-01-28: qty 2

## 2023-01-28 MED ORDER — METFORMIN HCL 1000 MG PO TABS: 1000.0000 mg | ORAL_TABLET | Freq: Every day | ORAL | 3 refills | Status: DC

## 2023-01-28 MED ORDER — LIDOCAINE 5 % EX PTCH: 1.0000 | MEDICATED_PATCH | CUTANEOUS | 0 refills | Status: DC

## 2023-01-28 NOTE — Assessment & Plan Note (Signed)
Echocardiogram done in October noted grade 2 diastolic dysfunction.  BNP checked prior to discharge noted to be elevated at 800.  Patient given 40 mg of IV Lasix and 40 mEq of potassium replacement on day of discharge.  She will be discharged on Lasix 20 mg p.o. daily x 3 days.  Recheck BNP and basic metabolic panel on Monday, 2/5.  At that time, additional Lasix can be determined.  Patient is not hypoxic and there is no evidence of volume overload.

## 2023-01-28 NOTE — TOC Transition Note (Signed)
Transition of Care [facility]) - CM/SW Discharge Note   Patient Details  Name: Linda Hess MRN: [id number] Date of Birth: [birth year]  [facility] CM/SW Contact:  Quin Hoop, LCSW Phone Number: 01/28/2023, 10:35 AM   Clinical Narrative:    Patient to be d/c'ed today to [facility].  Patient and family agreeable to plans will transport via ems RN to call report. [phone].     Final next level of care: Skilled Nursing Facility Barriers to Discharge: No Barriers Identified   Patient Goals and CMS Choice CMS Medicare.gov Compare Post Acute Care list provided to:: Patient [name], son, [phone]) Choice offered to / list presented to : Patient, Adult Children  Discharge Placement                Patient chooses bed at: [facility] Patient to be transferred to facility by: [facility] Name of family member notified: [name], son, [phone] Patient and family notified of of transfer: 01/28/23  Discharge Plan and Services Additional resources added to the After Visit Summary for                                       Social Determinants of Health (SDOH) Interventions SDOH Screenings   Food Insecurity: No Food Insecurity (01/25/2023)  Housing: Low Risk  (01/25/2023)  Transportation Needs: No Transportation Needs (01/25/2023)  Utilities: Not At Risk (01/25/2023)  Financial Resource Strain: Low Risk  (12/14/2018)  Physical Activity: Unknown (12/14/2018)  Social Connections: Somewhat Isolated (12/14/2018)  Stress: No Stress Concern Present (12/14/2018)  Tobacco Use: Low Risk  (01/24/2023)     Readmission Risk Interventions     No data to display

## 2023-01-28 NOTE — Inpatient Diabetes Management (Signed)
Inpatient Diabetes Program Recommendations  AACE/ADA: New Consensus Statement on Inpatient Glycemic Control   Target Ranges:  Prepandial:   less than 140 mg/dL      Peak postprandial:   less than 180 mg/dL (1-2 hours)      Critically ill patients:  140 - 180 mg/dL    Latest Reference Range & Units 01/27/23 07:38 01/27/23 12:41 01/27/23 15:35 01/27/23 20:07 01/28/23 08:24  Glucose-Capillary 70 - 99 mg/dL 100 (H) 277 (H) 254 (H) 223 (H) 83   Review of Glycemic Control  Diabetes history: DM2 Outpatient Diabetes medications: Metformin 500 mg daily, Januvia 100 mg daily Current orders for Inpatient glycemic control: Novolog 0-9 units TID with meals, Novolog 0-5 units QHS; Prednisone 10 mg QAM  Inpatient Diabetes Program Recommendations:    Insulin: If steroids are continued, please consider ordering Novolog 2 units TID with meals for meal coverage if patient eats at least 50% of meals.  Thanks, Barnie Alderman, RN, MSN, [facility] Coordinator Inpatient Diabetes Program [phone] (Team Pager from 8am to [place])

## 2023-01-28 NOTE — Discharge Summary (Signed)
Physician Discharge Summary   Patient: Linda Hess MRN: [id number] DOB: 1931/06/21  Admit date:     01/24/2023  Discharge date: 01/28/23  Discharge Physician: Hollice Espy   PCP: Smith Robert, MD   Recommendations at discharge:   New medication: Lasix 20 mg p.o. daily x 3 days New medication: Ceftin 500 mg p.o. twice daily x 4 days New medication: Zithromax 500 mg p.o. daily x 1 day Patient will need a basic metabolic panel and BNP checked on 2/5 Being discharged to skilled nursing New medication: Lidocaine patch 5% apply topically to hip in the morning, on for 12 hours, off for 12 hours New medication: Ultram 50 mg p.o. every 8 hours as needed for moderate pain Metformin increased from 500 mg to 1000 mg  Discharge Diagnoses: Active Problems:   Acute on chronic diastolic CHF (congestive heart failure) (HCC)   Essential hypertension, benign   Type 2 diabetes mellitus with stage IIIb chronic kidney disease, without long-term current use of insulin (HCC)   Right hip pain   Mixed hyperlipidemia  Principal Problem (Resolved):   AKI (acute kidney injury) (HCC) Resolved Problems:   Left lower lobe pneumonia   Acute metabolic encephalopathy  Hospital Course: 89 year old female with past medical history of hypertension and hyperlipidemia plus DVT on Xarelto who presented to the emergency room on 1/28 with slurred speech and more difficult to awaken.  Also complaining of some chronic left hip pain.  Workup revealed significant pneumonia with procalcitonin of 26 and lactic acid level of 3.9.  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  Patient put on IV fluids and antibiotics.  Following treatment, by that evening, speech issues have resolved.  Patient evaluated by physical therapy recommended skilled nursing.  Hip x-rays done noted chronic DJD.    Assessment and Plan: * AKI (acute kidney injury) (HCC)-resolved as of 01/27/2023 At baseline CKD stage IIIa.   Resolved with IV fluids.  Creatinine currently better than baseline  Left lower lobe pneumonia-resolved as of 01/28/2023 Responded well to antibiotics with procalcitonin continuing to decrease daily and white blood cell count normalized.  Sepsis ruled out given only 1 SIRS criteria.  Patient discharged on few more days of p.o. Ceftin and Zithromax.  Acute metabolic encephalopathy-resolved as of 01/27/2023 Given description of possible aphasia, concerns for possible TIA given improvement within less than 24 hours, and no new findings, but old infarct noted on MRI.  That said, blood pressure not elevated and actually low on admission plus acute kidney injury and significant pneumonia on admission that improved with IV fluids and antibiotics, at this time, not recommending any intervention and feel that her symptoms are more due to metabolic encephalopathy brought on by pneumonia.  Acute on chronic diastolic CHF (congestive heart failure) (HCC) Echocardiogram done in October noted grade 2 diastolic dysfunction.  BNP checked prior to discharge noted to be elevated at 800.  Patient given 40 mg of IV Lasix and 40 mEq of potassium replacement on day of discharge.  She will be discharged on Lasix 20 mg p.o. daily x 3 days.  Recheck BNP and basic metabolic panel on Monday, 2/5.  At that time, additional Lasix can be determined.  Patient is not hypoxic and there is no evidence of volume overload.  Chronic diastolic CHF (congestive heart failure) (HCC) Echocardiogram in October noted grade 2 diastolic dysfunction.  Checking BNP.  Essential hypertension, benign - Hydralazine 5 mg IV every 8 hours as needed for SBP greater than  175, 4 days ordered  Type 2 diabetes mellitus with stage IIIb chronic kidney disease, without long-term current use of insulin (HCC) Uncontrolled, A1c at 8.5.  Patient would benefit from increasing metformin from 500 to 1000.  Right hip pain Suspect secondary to osteoarthritis.  As  needed tramadol.  Have added lidocaine patch plus continuing as needed Ultram.  Seen by PT who are recommending skilled nursing.  X-rays unrevealing.  Mixed hyperlipidemia - Rosuvastatin 10 mg daily resumed        Pain control - [place] Controlled Substance Reporting System database was reviewed. and patient was instructed, not to drive, operate heavy machinery, perform activities at heights, swimming or participation in water activities or provide baby-sitting services while on Pain, Sleep and Anxiety Medications; until their outpatient Physician has advised to do so again. Also recommended to not to take more than prescribed Pain, Sleep and Anxiety Medications.  Consultants: None Procedures performed: None  Disposition: Skilled nursing facility Diet recommendation:  Discharge Diet Orders (From admission, onward)     Start     Ordered   01/28/23 0000  Diet - low sodium heart healthy        01/28/23 1011           Cardiac diet DISCHARGE MEDICATION: Allergies as of 01/28/2023       Reactions   Amoxicillin-pot Clavulanate Hives   Other Hives, Itching   RED MEAT   Penicillins    hives Other reaction(s): rash        Medication List     STOP taking these medications    HYDROcodone-acetaminophen 5-325 MG tablet Commonly known as: NORCO/VICODIN       TAKE these medications    acetaminophen 325 MG tablet Commonly known as: TYLENOL Take 325 mg by mouth every 6 (six) hours as needed for mild pain.   ascorbic acid 500 MG tablet Commonly known as: VITAMIN C Take 500 mg by mouth daily.   azithromycin 500 MG tablet Commonly known as: ZITHROMAX Take 1 tablet (500 mg total) by mouth at bedtime.   calcium-vitamin D 500-200 MG-UNIT tablet Commonly known as: OSCAL WITH D Take 1 tablet by mouth at bedtime.   cefUROXime 500 MG tablet Commonly known as: CEFTIN Take 1 tablet (500 mg total) by mouth 2 (two) times daily with a meal.   famotidine 20 MG  tablet Commonly known as: PEPCID Take 20 mg by mouth 2 (two) times daily.   Fish Oil 1200 MG Caps Take 1 capsule by mouth every morning.   furosemide 20 MG tablet Commonly known as: Lasix Take 1 tablet (20 mg total) by mouth daily for 3 days.   gabapentin 100 MG capsule Commonly known as: NEURONTIN Take 2 capsules (200 mg total) by mouth 3 (three) times daily.   Januvia 100 MG tablet Generic drug: sitaGLIPtin Take 100 mg by mouth daily.   lidocaine 5 % Commonly known as: LIDODERM Place 1 patch onto the skin daily. Remove & Discard patch within 12 hours or as directed by MD.  Apply to hip   metFORMIN 1000 MG tablet Commonly known as: GLUCOPHAGE Take 1 tablet (1,000 mg total) by mouth daily with breakfast. What changed:  medication strength how much to take when to take this   oxybutynin 5 MG tablet Commonly known as: DITROPAN Take 0.5 tablets by mouth 2 (two) times daily.   Oyster Shell Calcium w/D 500-5 MG-MCG Tabs Take 1 tablet by mouth at bedtime.   predniSONE 10 MG tablet Commonly  known as: DELTASONE Take 1 tablet (10 mg total) by mouth daily with breakfast.   rivaroxaban 10 MG Tabs tablet Commonly known as: XARELTO Take 1 tablet (10 mg total) by mouth daily.   rosuvastatin 10 MG tablet Commonly known as: CRESTOR Take 10 mg by mouth daily.   traMADol 50 MG tablet Commonly known as: ULTRAM Take 1 tablet (50 mg total) by mouth every 8 (eight) hours as needed for moderate pain.        Discharge Exam: Filed Weights   01/24/23 1352  Weight: 63.5 kg   General: Alert and oriented x 3, no acute distress Cardiovascular: Regular rate and rhythm, S1-S2  Condition at discharge: good  The results of significant diagnostics from this hospitalization (including imaging, microbiology, ancillary and laboratory) are listed below for reference.   Imaging Studies: MR BRAIN WO CONTRAST  Result Date: 01/24/2023 CLINICAL DATA:  Initial evaluation for neuro  deficit, stroke suspected. EXAM: MRI HEAD WITHOUT CONTRAST TECHNIQUE: Multiplanar, multiecho pulse sequences of the brain and surrounding structures were obtained without intravenous contrast. COMPARISON:  Prior CT from earlier the same day. FINDINGS: Brain: Diffuse prominence of the CSF containing spaces compatible generalized cerebral atrophy. Scattered patchy T2/FLAIR hyperintensity involving the periventricular deep white matter both cerebral hemispheres, most characteristic of chronic microvascular ischemic disease, mild for age. Remote lacunar infarct present at the left lentiform nucleus/external capsule. No evidence for acute or subacute ischemia. Gray-white matter differentiation maintained. No other areas of chronic cortical infarction. No acute intracranial hemorrhage. Single punctate chronic microhemorrhage noted within the subcortical left parietal region, likely small vessel related. No mass lesion, midline shift or mass effect. No hydrocephalus or extra-axial fluid collection. Pituitary gland and suprasellar region within normal limits. Vascular: Major intracranial vascular flow voids are maintained. Skull and upper cervical spine: Craniocervical junction normal. Bone marrow signal intensity normal. No scalp soft tissue abnormality. Sinuses/Orbits: Prior bilateral ocular lens replacement. Paranasal sinuses are clear. No mastoid effusion. Other: None. IMPRESSION: 1. No acute intracranial abnormality. 2. Generalized age-related cerebral atrophy with mild chronic small vessel ischemic disease, with a remote lacunar infarct at the left lentiform nucleus/external capsule. Electronically Signed   By: Jeannine Boga M.D.   On: 01/24/2023 22:18   DG Chest 2 View  Result Date: 01/24/2023 CLINICAL DATA:  Shortness of breath. EXAM: CHEST - 2 VIEW COMPARISON:  October 20, 2022. FINDINGS: Stable cardiomegaly. New large left lower lobe airspace opacity is noted concerning for pneumonia. Right lung is  clear. Bony thorax is unremarkable. IMPRESSION: New left lung opacity is noted concerning for pneumonia. Followup PA and lateral chest X-ray is recommended in 3-4 weeks following trial of antibiotic therapy to ensure resolution and exclude underlying malignancy. Aortic Atherosclerosis (ICD10-I70.0). Electronically Signed   By: Marijo Conception M.D.   On: 01/24/2023 18:56   CT HEAD WO CONTRAST (5MM)  Result Date: 01/24/2023 CLINICAL DATA:  Neuro deficit, acute, stroke suspected EXAM: CT HEAD WITHOUT CONTRAST TECHNIQUE: Contiguous axial images were obtained from the base of the skull through the vertex without intravenous contrast. RADIATION DOSE REDUCTION: This exam was performed according to the departmental dose-optimization program which includes automated exposure control, adjustment of the mA and/or kV according to patient size and/or use of iterative reconstruction technique. COMPARISON:  10/20/2022 FINDINGS: Brain: No intracranial hemorrhage, mass effect, or midline shift. Stable degree of atrophy and chronic small vessel ischemia. No hydrocephalus. The basilar cisterns are patent. No evidence of territorial infarct or acute ischemia. No extra-axial or intracranial fluid collection.  Vascular: Atherosclerosis of skullbase vasculature without hyperdense vessel or abnormal calcification. Skull: No fracture or focal lesion. Sinuses/Orbits: Paranasal sinuses and mastoid air cells are clear. The visualized orbits are unremarkable. Bilateral lens resection. Other: None. IMPRESSION: 1. No acute intracranial abnormality. 2. Stable atrophy and chronic small vessel ischemia. Electronically Signed   By: Keith Rake M.D.   On: 01/24/2023 17:33   DG HIP UNILAT WITH PELVIS 2-3 VIEWS RIGHT  Result Date: 01/24/2023 CLINICAL DATA:  Pain for 2 days. EXAM: DG HIP (WITH OR WITHOUT PELVIS) 2-3V RIGHT COMPARISON:  None Available. FINDINGS: No fracture, dislocation, or bony lesion. Degenerative changes in the hips without  loss of joint space. No other acute abnormalities. IMPRESSION: No cause for acute right hip pain identified. Electronically Signed   By: Dorise Bullion III M.D.   On: 01/24/2023 14:29    Microbiology: Results for orders placed or performed during the hospital encounter of 01/24/23  Resp panel by RT-PCR (RSV, Flu A&B, Covid) Anterior Nasal Swab     Status: None   Collection Time: 01/24/23  4:02 PM   Specimen: Anterior Nasal Swab  Result Value Ref Range Status   SARS Coronavirus 2 by RT PCR NEGATIVE NEGATIVE Final    Comment: (NOTE) SARS-CoV-2 target nucleic acids are NOT DETECTED.  The SARS-CoV-2 RNA is generally detectable in upper respiratory specimens during the acute phase of infection. The lowest concentration of SARS-CoV-2 viral copies this assay can detect is 138 copies/mL. A negative result does not preclude SARS-Cov-2 infection and should not be used as the sole basis for treatment or other patient management decisions. A negative result may occur with  improper specimen collection/handling, submission of specimen other than nasopharyngeal swab, presence of viral mutation(s) within the areas targeted by this assay, and inadequate number of viral copies(<138 copies/mL). A negative result must be combined with clinical observations, patient history, and epidemiological information. The expected result is Negative.  Fact Sheet for Patients:  EntrepreneurPulse.com.au  Fact Sheet for Healthcare Providers:  IncredibleEmployment.be  This test is no t yet approved or cleared by the [place] FDA and  has been authorized for detection and/or diagnosis of SARS-CoV-2 by FDA under an Emergency Use Authorization (EUA). This EUA will remain  in effect (meaning this test can be used) for the duration of the COVID-19 declaration under Section 564(b)(1) of the Act, 21 U.S.C.section 360bbb-3(b)(1), unless the authorization is terminated  or revoked  sooner.       Influenza A by PCR NEGATIVE NEGATIVE Final   Influenza B by PCR NEGATIVE NEGATIVE Final    Comment: (NOTE) The Xpert Xpress SARS-CoV-2/FLU/RSV plus assay is intended as an aid in the diagnosis of influenza from Nasopharyngeal swab specimens and should not be used as a sole basis for treatment. Nasal washings and aspirates are unacceptable for Xpert Xpress SARS-CoV-2/FLU/RSV testing.  Fact Sheet for Patients: EntrepreneurPulse.com.au  Fact Sheet for Healthcare Providers: IncredibleEmployment.be  This test is not yet approved or cleared by the [place] FDA and has been authorized for detection and/or diagnosis of SARS-CoV-2 by FDA under an Emergency Use Authorization (EUA). This EUA will remain in effect (meaning this test can be used) for the duration of the COVID-19 declaration under Section 564(b)(1) of the Act, 21 U.S.C. section 360bbb-3(b)(1), unless the authorization is terminated or revoked.     Resp Syncytial Virus by PCR NEGATIVE NEGATIVE Final    Comment: (NOTE) Fact Sheet for Patients: EntrepreneurPulse.com.au  Fact Sheet for Healthcare Providers: IncredibleEmployment.be  This test  is not yet approved or cleared by the [place] and has been authorized for detection and/or diagnosis of SARS-CoV-2 by FDA under an Emergency Use Authorization (EUA). This EUA will remain in effect (meaning this test can be used) for the duration of the COVID-19 declaration under Section 564(b)(1) of the Act, 21 U.S.C. section 360bbb-3(b)(1), unless the authorization is terminated or revoked.  Performed at [facility], [address] [place], [place] [address]   Culture, blood (Routine X 2) w Reflex to ID Panel     Status: None (Preliminary result)   Collection Time: 01/25/23  8:01 PM   Specimen: BLOOD  Result Value Ref Range Status   Specimen Description BLOOD RIGHT [facility]   Final   Special Requests   Final    BOTTLES DRAWN AEROBIC AND ANAEROBIC Blood Culture adequate volume   Culture   Final    NO GROWTH 3 DAYS Performed at [facility], [address] [place], [place] [address]    Report Status PENDING  Incomplete  Culture, blood (Routine X 2) w Reflex to ID Panel     Status: None (Preliminary result)   Collection Time: 01/25/23  8:02 PM   Specimen: BLOOD  Result Value Ref Range Status   Specimen Description BLOOD LEFT ANTECUBITAL  Final   Special Requests   Final    BOTTLES DRAWN AEROBIC AND ANAEROBIC Blood Culture adequate volume   Culture   Final    NO GROWTH 3 DAYS Performed at [facility], [address] [place],  [address]    Report Status PENDING  Incomplete    Labs: CBC: Recent Labs  Lab 01/24/23 1602 01/25/23 0334 01/26/23 0319 01/27/23 0411  WBC 17.7* 12.9* 9.4 8.7  NEUTROABS 15.7*  --   --   --   HGB 13.4 11.3* 11.1* 11.5*  HCT 43.4 36.2 35.4* 36.3  MCV 88.6 88.1 87.4 87.1  PLT 140* 131* 116* 182*   Basic Metabolic Panel: Recent Labs  Lab 01/24/23 1602 01/25/23 0334 01/26/23 0319 01/27/23 0411  NA 135 140 139 141  K 4.9 4.0 3.5 3.8  CL 102 107 109 108  CO2 23 27 23 24   GLUCOSE 356* 113* 127* 111*  BUN 44* 40* 35* 27*  CREATININE 1.63* 1.29* 0.96 0.89  CALCIUM 9.1 9.0 8.6* 9.1  MG  --   --  2.0 1.8  PHOS  --  2.7 2.2* 2.7   Liver Function Tests: Recent Labs  Lab 01/24/23 1602 01/25/23 0334 01/26/23 0319 01/27/23 0411  AST 36  --  19 17  ALT 20  --  15 15  ALKPHOS 44  --  39 38  BILITOT 1.2  --  0.4 0.8  PROT 5.9*  --  5.3* 5.4*  ALBUMIN 3.0* 2.6* 2.6* 2.7*   CBG: Recent Labs  Lab 01/27/23 0738 01/27/23 1241 01/27/23 1535 01/27/23 2007 01/28/23 0824  GLUCAP 100* 277* 254* 223* 83    Discharge time spent: less than 30 minutes.  Signed: Annita Brod, MD [facility] 01/28/2023

## 2023-01-28 NOTE — Progress Notes (Signed)
Pt A/ox4 upon getting ready for discharge. EMS called for transport. Report given to Phineas Real, Therapist, sports at [facility]

## 2023-01-28 NOTE — Care Management Important Message (Signed)
Important Message  Patient Details  Name: Linda Hess MRN: [id number] Date of Birth: 08-Feb-1931   Medicare Important Message Given:  Yes     Dannette Barbara 01/28/2023, 2:45 PM

## 2023-01-30 LAB — CULTURE, BLOOD (ROUTINE X 2)
Culture: NO GROWTH
Culture: NO GROWTH
Special Requests: ADEQUATE
Special Requests: ADEQUATE

## 2023-02-01 ENCOUNTER — Ambulatory Visit (INDEPENDENT_AMBULATORY_CARE_PROVIDER_SITE_OTHER): Payer: Medicare Other | Admitting: Vascular Surgery

## 2023-02-25 ENCOUNTER — Encounter: Payer: Self-pay | Admitting: Radiology

## 2023-08-10 ENCOUNTER — Encounter (HOSPITAL_COMMUNITY): Payer: Self-pay | Admitting: Emergency Medicine

## 2023-08-10 ENCOUNTER — Emergency Department (HOSPITAL_COMMUNITY): Payer: Medicare Other

## 2023-08-10 ENCOUNTER — Emergency Department (HOSPITAL_COMMUNITY)
Admission: EM | Admit: 2023-08-10 | Discharge: 2023-08-10 | Disposition: A | Discharge status: Home / Self Care | Payer: Medicare Other | Attending: Emergency Medicine | Admitting: Emergency Medicine

## 2023-08-10 DIAGNOSIS — Z7984 Long term (current) use of oral hypoglycemic drugs: Secondary | ICD-10-CM | POA: Diagnosis not present

## 2023-08-10 DIAGNOSIS — Z79899 Other long term (current) drug therapy: Secondary | ICD-10-CM | POA: Insufficient documentation

## 2023-08-10 DIAGNOSIS — M25511 Pain in right shoulder: Secondary | ICD-10-CM | POA: Insufficient documentation

## 2023-08-10 DIAGNOSIS — Z1152 Encounter for screening for COVID-19: Secondary | ICD-10-CM | POA: Diagnosis not present

## 2023-08-10 DIAGNOSIS — I110 Hypertensive heart disease with heart failure: Secondary | ICD-10-CM | POA: Insufficient documentation

## 2023-08-10 DIAGNOSIS — I509 Heart failure, unspecified: Secondary | ICD-10-CM | POA: Diagnosis not present

## 2023-08-10 DIAGNOSIS — E119 Type 2 diabetes mellitus without complications: Secondary | ICD-10-CM | POA: Diagnosis not present

## 2023-08-10 DIAGNOSIS — W1830XA Fall on same level, unspecified, initial encounter: Secondary | ICD-10-CM | POA: Diagnosis not present

## 2023-08-10 DIAGNOSIS — R531 Weakness: Secondary | ICD-10-CM | POA: Diagnosis not present

## 2023-08-10 DIAGNOSIS — R4182 Altered mental status, unspecified: Secondary | ICD-10-CM | POA: Insufficient documentation

## 2023-08-10 DIAGNOSIS — Z7901 Long term (current) use of anticoagulants: Secondary | ICD-10-CM | POA: Insufficient documentation

## 2023-08-10 DIAGNOSIS — R410 Disorientation, unspecified: Secondary | ICD-10-CM

## 2023-08-10 DIAGNOSIS — M25551 Pain in right hip: Secondary | ICD-10-CM | POA: Diagnosis not present

## 2023-08-10 DIAGNOSIS — R7989 Other specified abnormal findings of blood chemistry: Secondary | ICD-10-CM | POA: Diagnosis not present

## 2023-08-10 DIAGNOSIS — E041 Nontoxic single thyroid nodule: Secondary | ICD-10-CM

## 2023-08-10 DIAGNOSIS — W19XXXA Unspecified fall, initial encounter: Secondary | ICD-10-CM

## 2023-08-10 LAB — BRAIN NATRIURETIC PEPTIDE: B Natriuretic Peptide: 269 pg/mL — ABNORMAL HIGH (ref 0.0–100.0)

## 2023-08-10 LAB — COMPREHENSIVE METABOLIC PANEL
ALT: 14 U/L (ref 0–44)
AST: 24 U/L (ref 15–41)
Albumin: 3 g/dL — ABNORMAL LOW (ref 3.5–5.0)
Alkaline Phosphatase: 41 U/L (ref 38–126)
Anion gap: 14 (ref 5–15)
BUN: 17 mg/dL (ref 8–23)
CO2: 24 mmol/L (ref 22–32)
Calcium: 9.4 mg/dL (ref 8.9–10.3)
Chloride: 94 mmol/L — ABNORMAL LOW (ref 98–111)
Creatinine, Ser: 1.15 mg/dL — ABNORMAL HIGH (ref 0.44–1.00)
GFR, Estimated: 46 mL/min — ABNORMAL LOW (ref >60)
Glucose, Bld: 124 mg/dL — ABNORMAL HIGH (ref 70–99)
Potassium: 3.5 mmol/L (ref 3.5–5.1)
Sodium: 132 mmol/L — ABNORMAL LOW (ref 135–145)
Total Bilirubin: 2 mg/dL — ABNORMAL HIGH (ref 0.3–1.2)
Total Protein: 5.9 g/dL — ABNORMAL LOW (ref 6.5–8.1)

## 2023-08-10 LAB — URINALYSIS, ROUTINE W REFLEX MICROSCOPIC
Bacteria, UA: NONE SEEN
Bilirubin Urine: NEGATIVE
Glucose, UA: NEGATIVE mg/dL
Hgb urine dipstick: NEGATIVE
Ketones, ur: 20 mg/dL — AB
Nitrite: NEGATIVE
Protein, ur: 30 mg/dL — AB
Specific Gravity, Urine: 1.018 (ref 1.005–1.030)
pH: 5 (ref 5.0–8.0)

## 2023-08-10 LAB — CULTURE, BLOOD (ROUTINE X 2): Special Requests: ADEQUATE

## 2023-08-10 LAB — CBC WITH DIFFERENTIAL/PLATELET
Abs Immature Granulocytes: 0.02 10*3/uL (ref 0.00–0.07)
Basophils Absolute: 0 10*3/uL (ref 0.0–0.1)
Basophils Relative: 1 %
Eosinophils Absolute: 0.2 10*3/uL (ref 0.0–0.5)
Eosinophils Relative: 3 %
HCT: 40.6 % (ref 36.0–46.0)
Hemoglobin: 13.1 g/dL (ref 12.0–15.0)
Immature Granulocytes: 0 %
Lymphocytes Relative: 20 %
Lymphs Abs: 1.4 10*3/uL (ref 0.7–4.0)
MCH: 29.4 pg (ref 26.0–34.0)
MCHC: 32.3 g/dL (ref 30.0–36.0)
MCV: 91 fL (ref 80.0–100.0)
Monocytes Absolute: 0.9 10*3/uL (ref 0.1–1.0)
Monocytes Relative: 14 %
Neutro Abs: 4.3 10*3/uL (ref 1.7–7.7)
Neutrophils Relative %: 62 %
Platelets: 214 10*3/uL (ref 150–400)
RBC: 4.46 MIL/uL (ref 3.87–5.11)
RDW: 13.2 % (ref 11.5–15.5)
WBC: 6.8 10*3/uL (ref 4.0–10.5)
nRBC: 0 % (ref 0.0–0.2)

## 2023-08-10 LAB — LACTIC ACID, PLASMA
Lactic Acid, Venous: 2.5 mmol/L (ref 0.5–1.9)
Lactic Acid, Venous: 1.6 mmol/L (ref 0.5–1.9)

## 2023-08-10 LAB — RESP PANEL BY RT-PCR (RSV, FLU A&B, COVID)  RVPGX2
Influenza A by PCR: NEGATIVE
Influenza B by PCR: NEGATIVE
Resp Syncytial Virus by PCR: NEGATIVE
SARS Coronavirus 2 by RT PCR: NEGATIVE

## 2023-08-10 LAB — MAGNESIUM: Magnesium: 1.7 mg/dL (ref 1.7–2.4)

## 2023-08-10 LAB — TROPONIN I (HIGH SENSITIVITY)
Troponin I (High Sensitivity): 41 ng/L — ABNORMAL HIGH (ref <18)
Troponin I (High Sensitivity): 37 ng/L — ABNORMAL HIGH (ref <18)

## 2023-08-10 MED ORDER — ASPIRIN 81 MG PO CHEW
324.0000 mg | CHEWABLE_TABLET | Freq: Once | ORAL | Status: AC
  Administered 2023-08-10: 324 mg via ORAL
  Filled 2023-08-10: qty 4

## 2023-08-10 MED ORDER — LACTATED RINGERS IV BOLUS
500.0000 mL | Freq: Once | INTRAVENOUS | Status: AC
  Administered 2023-08-10: 500 mL via INTRAVENOUS

## 2023-08-10 NOTE — ED Notes (Signed)
Pt refused sling as she states "nothing is wrong with my arm, I can move it and I dont feel any pain with it"

## 2023-08-10 NOTE — Progress Notes (Signed)
Patient presents with AMS and falls. As part of workup EKG with lateral and anterolateral TWIs in absence of cardiopulmonary symptoms. Mild trop 40 on repeat 37. Lactic acid elevated 2.5, ongoing workup for etiology for AMS. No acute cardiac testing indicated, would obtain echo in AM. We will see patient as consult in the AM.  Dominga Ferry MD

## 2023-08-10 NOTE — ED Notes (Signed)
Patient transported to CT 

## 2023-08-10 NOTE — ED Notes (Signed)
Both sets of blood cultures obtained before any antibiotic administration  

## 2023-08-10 NOTE — ED Notes (Signed)
Pts skin is sensitive as she appears to not tolerate the BP cycling well.

## 2023-08-10 NOTE — Discharge Instructions (Addendum)
Workup here without any acute findings.  Sounds as if she is having some sundowning confusion at night.  Will contact her primary care doctor to see if they want to try medication at nighttime.  That is reassuring that the confusion is not there during the daytime.  Patient may need a sitter at night as well.  No indications for admission here today.  Incidental finding on the CT scans of a thyroid nodule.  Recommend just follow-up with primary care provider for additional workup of this.

## 2023-08-10 NOTE — ED Provider Notes (Signed)
Patient very alert currently.  According to family most of the confusion is at night.  Workup for the fall and medical workup for the confusion without any acute findings.  CT head CT cervical spine without any acute findings.  CT chest abdomen and pelvis without any acute findings.  There is evidence of a thyroid nodule which could require outpatient follow-up.  Gust with family.  Patient stable for discharge home.   Vanetta Mulders, MD 08/10/23 1815

## 2023-08-10 NOTE — ED Triage Notes (Signed)
Pt here via ems from home due to family reporting increased weakness, foul smelling urine, multiple falls (one today and one yesterday). Pt on xarelto. Pt normally alert and oriented X4, today disoriented to event. Pt also complains of R shoulder and hip pain.

## 2023-08-10 NOTE — ED Provider Notes (Signed)
[place] EMERGENCY DEPARTMENT AT [facility] Provider Note   CSN: [id number] Arrival date & time: 08/10/23  1010     History  Chief Complaint  Patient presents with   Altered Mental Status   Fall    Linda Hess is a 89 y.o. female.  HPI Patient presents for altered mental status.  Medical history includes T2DM, HLD, HTN, PAD, neuropathy, DVT, CHF, lower extremity lymphedema, CIDP.  She arrives via EMS from home.  Family noted change in mentation yesterday.  She had a fall both yesterday and today.  Currently, patient endorses pain in right hip and right shoulder.  She denies any other areas of discomfort.  At baseline, she is ambulatory with assistive device.    Home Medications Prior to Admission medications   Medication Sig Start Date End Date Taking? Authorizing Provider  acetaminophen (TYLENOL) 325 MG tablet Take 325 mg by mouth every 6 (six) hours as needed for mild pain.    [provider]  azithromycin (ZITHROMAX) 500 MG tablet Take 1 tablet (500 mg total) by mouth at bedtime. 01/28/23   Hollice Espy, MD  Calcium Carb-Cholecalciferol (OYSTER SHELL CALCIUM W/D) 500-5 MG-MCG TABS Take 1 tablet by mouth at bedtime. 12/23/22   [provider]  calcium-vitamin D (OSCAL WITH D) 500-200 MG-UNIT per tablet Take 1 tablet by mouth at bedtime.  03/14/14   Jonelle Sidle, MD  cefUROXime (CEFTIN) 500 MG tablet Take 1 tablet (500 mg total) by mouth 2 (two) times daily with a meal. 01/28/23   Hollice Espy, MD  famotidine (PEPCID) 20 MG tablet Take 20 mg by mouth 2 (two) times daily. 12/23/22   [provider]  furosemide (LASIX) 20 MG tablet Take 1 tablet (20 mg total) by mouth daily for 3 days. 01/28/23 01/31/23  Hollice Espy, MD  gabapentin (NEURONTIN) 100 MG capsule Take 2 capsules (200 mg total) by mouth 3 (three) times daily. 10/25/22   Enedina Finner, MD  JANUVIA 100 MG tablet Take 100 mg by mouth daily. 03/21/20   [provider]  lidocaine (LIDODERM) 5 % Place 1 patch onto the skin daily. Remove & Discard patch within 12 hours or as directed by MD.  Apply to hip 01/28/23   Hollice Espy, MD  metFORMIN (GLUCOPHAGE) 1000 MG tablet Take 1 tablet (1,000 mg total) by mouth daily with breakfast. 01/28/23   Hollice Espy, MD  Omega-3 Fatty Acids (FISH OIL) 1200 MG CAPS Take 1 capsule by mouth every morning.     [provider]  oxybutynin (DITROPAN) 5 MG tablet Take 0.5 tablets by mouth 2 (two) times daily.    [provider]  predniSONE (DELTASONE) 10 MG tablet Take 1 tablet (10 mg total) by mouth daily with breakfast. 07/25/21   Shon Hale, MD  rivaroxaban (XARELTO) 10 MG TABS tablet Take 1 tablet (10 mg total) by mouth daily. 10/26/22   Enedina Finner, MD  rosuvastatin (CRESTOR) 10 MG tablet Take 10 mg by mouth daily. 03/30/19   [provider]  traMADol (ULTRAM) 50 MG tablet Take 1 tablet (50 mg total) by mouth every 8 (eight) hours as needed for moderate pain. 01/28/23   Hollice Espy, MD  vitamin C (ASCORBIC ACID) 500 MG tablet Take 500 mg by mouth daily.    [provider]      Allergies    Amoxicillin-pot clavulanate, Other, and Penicillins    Review of Systems   Review of Systems  Unable to perform ROS: Mental status change  Musculoskeletal:  Positive for arthralgias.  Psychiatric/Behavioral:  Positive for confusion.     Physical Exam Updated Vital Signs BP 125/86   Pulse 77   Temp 97.9 F (36.6 C) (Oral)   Resp 18   SpO2 90%  Physical Exam Vitals and nursing note reviewed.  Constitutional:      General: She is not in acute distress.    Appearance: She is well-developed. She is not toxic-appearing or diaphoretic.  HENT:     Head: Normocephalic and atraumatic.     Right Ear: External ear normal.     Left Ear: External ear normal.     Nose: Nose normal.     Mouth/Throat:     Mouth: Mucous membranes are moist.  Eyes:     Extraocular  Movements: Extraocular movements intact.     Conjunctiva/sclera: Conjunctivae normal.  Cardiovascular:     Rate and Rhythm: Normal rate and regular rhythm.     Heart sounds: No murmur heard. Pulmonary:     Effort: Pulmonary effort is normal. No respiratory distress.     Breath sounds: Normal breath sounds. No wheezing or rales.  Chest:     Chest wall: No tenderness.  Abdominal:     General: There is no distension.     Palpations: Abdomen is soft.     Tenderness: There is no abdominal tenderness.  Musculoskeletal:        General: No swelling.     Cervical back: Normal range of motion and neck supple.     Right lower leg: Edema present.     Left lower leg: Edema present.  Skin:    General: Skin is warm and dry.     Findings: Bruising present.  Neurological:     General: No focal deficit present.     Mental Status: She is alert and oriented to person, place, and time.     Cranial Nerves: No cranial nerve deficit.     Sensory: No sensory deficit.     Motor: No weakness.     Coordination: Coordination normal.  Psychiatric:        Mood and Affect: Mood normal.        Behavior: Behavior normal.     ED Results / Procedures / Treatments   Labs (all labs ordered are listed, but only abnormal results are displayed) Labs Reviewed  LACTIC ACID, PLASMA - Abnormal; Notable for the following components:      Result Value   Lactic Acid, Venous 2.5 (*)    All other components within normal limits  COMPREHENSIVE METABOLIC PANEL - Abnormal; Notable for the following components:   Sodium 132 (*)    Chloride 94 (*)    Glucose, Bld 124 (*)    Creatinine, Ser 1.15 (*)    Total Protein 5.9 (*)    Albumin 3.0 (*)    Total Bilirubin 2.0 (*)    GFR, Estimated 46 (*)    All other components within normal limits  BRAIN NATRIURETIC PEPTIDE - Abnormal; Notable for the following components:   B Natriuretic Peptide 269.0 (*)    All other components within normal limits  URINALYSIS, ROUTINE W  REFLEX MICROSCOPIC - Abnormal; Notable for the following components:   APPearance HAZY (*)    Ketones, ur 20 (*)    Protein, ur 30 (*)    Leukocytes,Ua TRACE (*)    Non Squamous Epithelial 0-5 (*)    All other components within normal limits  TROPONIN I (HIGH SENSITIVITY) - Abnormal; Notable for the following components:   Troponin I (High Sensitivity) 41 (*)    All other components within normal limits  TROPONIN I (HIGH SENSITIVITY) - Abnormal; Notable for the following components:   Troponin I (High Sensitivity) 37 (*)    All other components within normal limits  CULTURE, BLOOD (ROUTINE X 2)  CULTURE, BLOOD (ROUTINE X 2)  RESP PANEL BY RT-PCR (RSV, FLU A&B, COVID)  [id number]  URINE CULTURE  LACTIC ACID, PLASMA  CBC WITH DIFFERENTIAL/PLATELET  MAGNESIUM    EKG EKG Interpretation Date/Time:  Tuesday August 10 2023 10:40:34 EDT Ventricular Rate:  84 PR Interval:    QRS Duration:  106 QT Interval:  465 QTC Calculation: 550 R Axis:   2  Text Interpretation: Sinus rhythm Probable left ventricular hypertrophy Anterior Q waves, possibly due to LVH Abnormal T, probable ischemia, widespread ST elevation, consider inferior injury Prolonged QT interval Confirmed by Gloris Manchester [phone]) on 08/10/2023 11:27:27 AM  Radiology CT CHEST ABDOMEN PELVIS WO CONTRAST  Result Date: 08/10/2023 CLINICAL DATA:  Trauma, multiple falls EXAM: CT CHEST, ABDOMEN AND PELVIS WITHOUT CONTRAST TECHNIQUE: Multidetector CT imaging of the chest, abdomen and pelvis was performed following the standard protocol without IV contrast. RADIATION DOSE REDUCTION: This exam was performed according to the departmental dose-optimization program which includes automated exposure control, adjustment of the mA and/or kV according to patient size and/or use of iterative reconstruction technique. COMPARISON:  CT abdomen and pelvis done on 10/20/2022 FINDINGS: CT CHEST FINDINGS Cardiovascular: Calcifications are seen in thoracic aorta.  Major vascular structures in mediastinum appear intact. Mediastinum/Nodes: There is no mediastinal hematoma. There is 2.7 cm low-density nodule in left lobe of the thyroid. Lungs/Pleura: There is no focal pulmonary consolidation. Breathing motion limits evaluation of lower lung fields. Increase in interstitial markings in lower lung fields may suggest scarring. There is ectasia bronchi in the lower lobes. Musculoskeletal: No acute findings are seen bony structures in thorax. There is encroachment of neural foramina by bony spurs in lower cervical spine. CT ABDOMEN PELVIS FINDINGS Hepatobiliary: No focal abnormalities are seen in liver. There is no dilation of intrahepatic bile ducts. Gallbladder is not seen. Pancreas: No focal abnormalities are seen. Spleen: Unremarkable. Adrenals/Urinary Tract: Adrenals are unremarkable. There is no hydronephrosis. There are no renal or ureteral stones. Urinary bladder is unremarkable. Stomach/Bowel: Moderate to large hiatal hernia is seen. Stomach is not distended. Small bowel loops are not dilated. Appendix is not dilated. There is no significant wall thickening in colon. There is no pericolic stranding. Vascular/Lymphatic: Arterial calcifications are seen in aorta and its major branches. Reproductive: Unremarkable. Other: There is no ascites or pneumoperitoneum. There is paraumbilical hernia containing fat. Musculoskeletal: Deformity in left superior and inferior pubic rami suggest old fractures. There are smoothly marginated calcifications anterior to the proximal left femur with no significant change. Decrease in height of upper endplate of body of L1 vertebra has not changed. Marked degenerative changes are noted in lumbar spine with spinal stenosis and encroachment of neural foramina at multiple levels with no significant interval change. IMPRESSION: No acute findings are seen in noncontrast CT chest, abdomen and pelvis. There is no evidence of mediastinal or retroperitoneal  hematoma. There is no focal pulmonary consolidation. There is no pleural effusion or pneumothorax. There is no laceration of the solid organs. There is no bowel wall thickening. There is no ascites or pneumoperitoneum. Moderate to large fixed hiatal hernia. Umbilical/paraumbilical hernia containing fat is seen. There is  2.7 cm low-density nodule in the left lobe of thyroid. Recommend non-emergent thyroid ultrasound if clinically warranted given patient age. Reference: J Am Coll Radiol. 2015 Feb;12(2): 143-50 Aortic arteriosclerosis.  Cervical spondylosis.  Lumbar spondylosis. Other chronic findings as described in the body of the report. Electronically Signed   By: Ernie Avena M.D.   On: 08/10/2023 13:14   CT Head Wo Contrast  Result Date: 08/10/2023 CLINICAL DATA:  Head trauma, minor (Age >= 65y); Neck trauma (Age >= 65y) EXAM: CT HEAD WITHOUT CONTRAST CT CERVICAL SPINE WITHOUT CONTRAST TECHNIQUE: Multidetector CT imaging of the head and cervical spine was performed following the standard protocol without intravenous contrast. Multiplanar CT image reconstructions of the cervical spine were also generated. RADIATION DOSE REDUCTION: This exam was performed according to the departmental dose-optimization program which includes automated exposure control, adjustment of the mA and/or kV according to patient size and/or use of iterative reconstruction technique. COMPARISON:  CT Head and C Spine 10/20/22 FINDINGS: CT HEAD FINDINGS Brain: No evidence of acute infarction, hemorrhage, hydrocephalus, extra-axial collection or mass lesion/mass effect. Sequela of mild chronic microvascular ischemic change. Generalized volume loss. Vascular: No hyperdense vessel or unexpected calcification. Skull: Normal. Negative for fracture or focal lesion. Sinuses/Orbits: No middle ear or mastoid effusion. Paranasal sinuses are clear. Orbits are unremarkable. Other: None. CT CERVICAL SPINE FINDINGS Alignment: Trace  anterolisthesis of C3 on C4 and C4 on C5. Trace retrolisthesis of C5 on C6. Skull base and vertebrae: No acute fracture. No primary bone lesion or focal pathologic process. Soft tissues and spinal canal: No prevertebral fluid or swelling. No visible canal hematoma. Disc levels:  No CT evidence of high-grade spinal canal stenosis. Upper chest: Negative. Other: 2.6 cm left thyroid nodule, unchanged compared to 2023 IMPRESSION: 1. No acute intracranial abnormality. 2. No acute fracture or traumatic malalignment of the cervical spine. Electronically Signed   By: Lorenza Cambridge M.D.   On: 08/10/2023 12:51   CT Cervical Spine Wo Contrast  Result Date: 08/10/2023 CLINICAL DATA:  Head trauma, minor (Age >= 65y); Neck trauma (Age >= 65y) EXAM: CT HEAD WITHOUT CONTRAST CT CERVICAL SPINE WITHOUT CONTRAST TECHNIQUE: Multidetector CT imaging of the head and cervical spine was performed following the standard protocol without intravenous contrast. Multiplanar CT image reconstructions of the cervical spine were also generated. RADIATION DOSE REDUCTION: This exam was performed according to the departmental dose-optimization program which includes automated exposure control, adjustment of the mA and/or kV according to patient size and/or use of iterative reconstruction technique. COMPARISON:  CT Head and C Spine 10/20/22 FINDINGS: CT HEAD FINDINGS Brain: No evidence of acute infarction, hemorrhage, hydrocephalus, extra-axial collection or mass lesion/mass effect. Sequela of mild chronic microvascular ischemic change. Generalized volume loss. Vascular: No hyperdense vessel or unexpected calcification. Skull: Normal. Negative for fracture or focal lesion. Sinuses/Orbits: No middle ear or mastoid effusion. Paranasal sinuses are clear. Orbits are unremarkable. Other: None. CT CERVICAL SPINE FINDINGS Alignment: Trace anterolisthesis of C3 on C4 and C4 on C5. Trace retrolisthesis of C5 on C6. Skull base and vertebrae: No acute  fracture. No primary bone lesion or focal pathologic process. Soft tissues and spinal canal: No prevertebral fluid or swelling. No visible canal hematoma. Disc levels:  No CT evidence of high-grade spinal canal stenosis. Upper chest: Negative. Other: 2.6 cm left thyroid nodule, unchanged compared to 2023 IMPRESSION: 1. No acute intracranial abnormality. 2. No acute fracture or traumatic malalignment of the cervical spine. Electronically Signed   By: Elige Radon.D.  On: 08/10/2023 12:51   DG Shoulder Right Portable  Result Date: 08/10/2023 CLINICAL DATA:  Pain after trauma EXAM: RIGHT SHOULDER - 3 VIEW COMPARISON:  None Available. FINDINGS: Severe osteopenia. Elevated humeral head. Please correlate for a full-thickness rotator cuff tear. There are some bony remodeling along the inferior aspect of the acromion. Slight degenerative changes of the [facility] joint IMPRESSION: Osteopenia with degenerative changes. Elevated humeral head. Please correlate for a full-thickness rotator cuff tear. Electronically Signed   By: Karen Kays M.D.   On: 08/10/2023 12:09   DG Chest Port 1 View  Result Date: 08/10/2023 CLINICAL DATA:  89 year old female with possible sepsis EXAM: PORTABLE CHEST 1 VIEW COMPARISON:  01/24/2023 FINDINGS: Cardiomediastinal silhouette unchanged in size and contour. No evidence of central vascular congestion. No interlobular septal thickening. Improved aeration in the bilateral lungs. Coarsened interstitial markings with no new airspace disease. No pneumothorax or pleural effusion. Coarsened interstitial markings, with no confluent airspace disease. No displaced fracture. IMPRESSION: Improved aeration of the lungs compared to the prior plain film, with no evidence of acute cardiopulmonary disease on the current. Electronically Signed   By: Gilmer Mor D.O.   On: 08/10/2023 12:08    Procedures Procedures    Medications Ordered in ED Medications  aspirin chewable tablet 324 mg (324 mg Oral  Given 08/10/23 1209)  lactated ringers bolus 500 mL (0 mLs Intravenous Stopped 08/10/23 1321)    ED Course/ Medical Decision Making/ A&P                                 Medical Decision Making Amount and/or Complexity of Data Reviewed Labs: ordered. Radiology: ordered. ECG/medicine tests: ordered.  Risk OTC drugs.   This patient presents to the ED for concern of generalized weakness and falls, this involves an extensive number of treatment options, and is a complaint that carries with it a high risk of complications and morbidity.  The differential diagnosis includes acute injuries, infection, metabolic derangements, polypharmacy   Co morbidities that complicate the patient evaluation  T2DM, HLD, HTN, PAD, neuropathy, DVT, CHF, lower extremity lymphedema, CIDP   Additional history obtained:  Additional history obtained from EMS External records from outside source obtained and reviewed including EMR   Lab Tests:  I Ordered, and personally interpreted labs.  The pertinent results include: Mild elevations in BNP and troponin.  Normal hemoglobin, no leukocytosis.  Creatinine is slightly elevated from baseline.  Lactate is mildly elevated but normalized after IV fluids.   Imaging Studies ordered:  I ordered imaging studies including chest x-ray, right shoulder x-ray, CT of head, cervical spine, chest, abdomen, pelvis I independently visualized and interpreted imaging which showed elevated right humeral head concerning for rotator cuff injury without other acute findings I agree with the radiologist interpretation   Cardiac Monitoring: / EKG:  The patient was maintained on a cardiac monitor.  I personally viewed and interpreted the cardiac monitored which showed an underlying rhythm of: Sinus rhythm   Consultations Obtained:  I requested consultation with the cardiologist, Dr. Wyline Mood,  and discussed lab and imaging findings as well as pertinent plan - they recommend: Cycle  troponins, cardiology will see in consult.  Determination for heparin following further lab work.   Problem List / ED Course / Critical interventions / Medication management  Patient presents via EMS from home for concern of altered mental status and falls since yesterday.  Although family reported disorientation, patient  is alert and oriented on arrival.  She describes pain in upper right hip.  On exam, it appears that her left leg is actually shortened.  She also endorses pain in area of right shoulder.  Upper trapezius in this area is tender to palpation.  She does not have focal neurologic deficits.  She does have extremity edema with purple discoloration.  I suspect these are chronic findings.  She has swelling to her left wrist that is concerning for deformity, however, she has no tenderness to this area.  This may be an old injury.  Percocet was ordered for analgesia.  Broad workup was initiated.  EKG showed worsening of chronic T wave abnormalities in precordial leads concerning for ischemia.  I spoke with cardiologist, Dr. Wyline Mood, about this.  He advises continuing cycling troponins.  He will see in consult.  Patient had CT imaging which did not show any acute findings.  Right shoulder x-ray showed elevated humeral head concerning for rotator cuff injury.  Second opponent did not show any further increase.  Although patient arrived in the ED alert and oriented, she did have subsequent confusion.  This is concerning for delirium.  At time of signout, urinalysis was pending.  Care of patient was signed to oncoming ED provider. I ordered medication including IV fluids for hydration; ASA for EKG changes Reevaluation of the patient after these medicines showed that the patient improved I have reviewed the patients home medicines and have made adjustments as needed   Social Determinants of Health:  Lives at home with family        Final Clinical Impression(s) / ED Diagnoses Final diagnoses:   Generalized weakness  Fall, initial encounter    Rx / DC Orders ED Discharge Orders     None         Gloris Manchester, MD 08/10/23 1629

## 2023-08-12 ENCOUNTER — Other Ambulatory Visit (HOSPITAL_COMMUNITY): Payer: Self-pay | Admitting: Nurse Practitioner

## 2023-08-12 DIAGNOSIS — R4182 Altered mental status, unspecified: Secondary | ICD-10-CM

## 2023-08-12 DIAGNOSIS — R2990 Unspecified symptoms and signs involving the nervous system: Secondary | ICD-10-CM

## 2023-08-13 ENCOUNTER — Ambulatory Visit
Admission: RE | Admit: 2023-08-13 | Discharge: 2023-08-13 | Disposition: A | Discharge status: Home / Self Care | Payer: Medicare Other | Source: Ambulatory Visit | Attending: Nurse Practitioner | Admitting: Nurse Practitioner

## 2023-08-13 DIAGNOSIS — R2990 Unspecified symptoms and signs involving the nervous system: Secondary | ICD-10-CM | POA: Insufficient documentation

## 2023-08-13 DIAGNOSIS — R4182 Altered mental status, unspecified: Secondary | ICD-10-CM | POA: Insufficient documentation

## 2023-08-13 MED ORDER — GADOBUTROL 1 MMOL/ML IV SOLN
6.0000 mL | Freq: Once | INTRAVENOUS | Status: AC | PRN
  Administered 2023-08-13: 6 mL via INTRAVENOUS

## 2023-08-15 ENCOUNTER — Other Ambulatory Visit: Payer: Self-pay

## 2023-08-15 ENCOUNTER — Emergency Department (HOSPITAL_COMMUNITY): Payer: Medicare Other

## 2023-08-15 ENCOUNTER — Encounter (HOSPITAL_COMMUNITY): Payer: Self-pay | Admitting: Emergency Medicine

## 2023-08-15 ENCOUNTER — Inpatient Hospital Stay (HOSPITAL_COMMUNITY)
Admission: EM | Admit: 2023-08-15 | Discharge: 2023-08-29 | DRG: 871 | Disposition: E | Payer: Medicare Other | Attending: Critical Care Medicine | Admitting: Critical Care Medicine

## 2023-08-15 DIAGNOSIS — E1142 Type 2 diabetes mellitus with diabetic polyneuropathy: Secondary | ICD-10-CM | POA: Diagnosis present

## 2023-08-15 DIAGNOSIS — I110 Hypertensive heart disease with heart failure: Secondary | ICD-10-CM | POA: Diagnosis present

## 2023-08-15 DIAGNOSIS — G6181 Chronic inflammatory demyelinating polyneuritis: Secondary | ICD-10-CM | POA: Diagnosis present

## 2023-08-15 DIAGNOSIS — Z602 Problems related to living alone: Secondary | ICD-10-CM | POA: Diagnosis present

## 2023-08-15 DIAGNOSIS — E782 Mixed hyperlipidemia: Secondary | ICD-10-CM | POA: Diagnosis present

## 2023-08-15 DIAGNOSIS — J9601 Acute respiratory failure with hypoxia: Secondary | ICD-10-CM | POA: Diagnosis present

## 2023-08-15 DIAGNOSIS — Z96651 Presence of right artificial knee joint: Secondary | ICD-10-CM | POA: Diagnosis present

## 2023-08-15 DIAGNOSIS — E876 Hypokalemia: Secondary | ICD-10-CM | POA: Diagnosis present

## 2023-08-15 DIAGNOSIS — R6521 Severe sepsis with septic shock: Secondary | ICD-10-CM | POA: Diagnosis present

## 2023-08-15 DIAGNOSIS — Z1152 Encounter for screening for COVID-19: Secondary | ICD-10-CM | POA: Diagnosis not present

## 2023-08-15 DIAGNOSIS — J189 Pneumonia, unspecified organism: Secondary | ICD-10-CM | POA: Diagnosis present

## 2023-08-15 DIAGNOSIS — F0394 Unspecified dementia, unspecified severity, with anxiety: Secondary | ICD-10-CM | POA: Diagnosis present

## 2023-08-15 DIAGNOSIS — G9340 Encephalopathy, unspecified: Secondary | ICD-10-CM | POA: Diagnosis present

## 2023-08-15 DIAGNOSIS — E872 Acidosis, unspecified: Secondary | ICD-10-CM | POA: Diagnosis present

## 2023-08-15 DIAGNOSIS — N179 Acute kidney failure, unspecified: Secondary | ICD-10-CM | POA: Diagnosis present

## 2023-08-15 DIAGNOSIS — Z7952 Long term (current) use of systemic steroids: Secondary | ICD-10-CM

## 2023-08-15 DIAGNOSIS — Z86718 Personal history of other venous thrombosis and embolism: Secondary | ICD-10-CM

## 2023-08-15 DIAGNOSIS — I5032 Chronic diastolic (congestive) heart failure: Secondary | ICD-10-CM | POA: Diagnosis present

## 2023-08-15 DIAGNOSIS — Z809 Family history of malignant neoplasm, unspecified: Secondary | ICD-10-CM

## 2023-08-15 DIAGNOSIS — Z7984 Long term (current) use of oral hypoglycemic drugs: Secondary | ICD-10-CM

## 2023-08-15 DIAGNOSIS — D638 Anemia in other chronic diseases classified elsewhere: Secondary | ICD-10-CM | POA: Diagnosis present

## 2023-08-15 DIAGNOSIS — Z66 Do not resuscitate: Secondary | ICD-10-CM | POA: Diagnosis present

## 2023-08-15 DIAGNOSIS — Z9181 History of falling: Secondary | ICD-10-CM

## 2023-08-15 DIAGNOSIS — Z8249 Family history of ischemic heart disease and other diseases of the circulatory system: Secondary | ICD-10-CM

## 2023-08-15 DIAGNOSIS — E871 Hypo-osmolality and hyponatremia: Secondary | ICD-10-CM | POA: Diagnosis present

## 2023-08-15 DIAGNOSIS — R7989 Other specified abnormal findings of blood chemistry: Secondary | ICD-10-CM | POA: Diagnosis not present

## 2023-08-15 DIAGNOSIS — Z515 Encounter for palliative care: Secondary | ICD-10-CM

## 2023-08-15 DIAGNOSIS — Z9049 Acquired absence of other specified parts of digestive tract: Secondary | ICD-10-CM

## 2023-08-15 DIAGNOSIS — G9341 Metabolic encephalopathy: Secondary | ICD-10-CM | POA: Diagnosis present

## 2023-08-15 DIAGNOSIS — A419 Sepsis, unspecified organism: Secondary | ICD-10-CM | POA: Diagnosis not present

## 2023-08-15 DIAGNOSIS — Z880 Allergy status to penicillin: Secondary | ICD-10-CM

## 2023-08-15 DIAGNOSIS — N390 Urinary tract infection, site not specified: Secondary | ICD-10-CM | POA: Diagnosis present

## 2023-08-15 DIAGNOSIS — Z79899 Other long term (current) drug therapy: Secondary | ICD-10-CM

## 2023-08-15 DIAGNOSIS — Z7901 Long term (current) use of anticoagulants: Secondary | ICD-10-CM | POA: Diagnosis not present

## 2023-08-15 DIAGNOSIS — R0603 Acute respiratory distress: Secondary | ICD-10-CM | POA: Diagnosis not present

## 2023-08-15 DIAGNOSIS — Z823 Family history of stroke: Secondary | ICD-10-CM

## 2023-08-15 LAB — COMPREHENSIVE METABOLIC PANEL
ALT: 17 U/L (ref 0–44)
AST: 39 U/L (ref 15–41)
Albumin: 2.4 g/dL — ABNORMAL LOW (ref 3.5–5.0)
Alkaline Phosphatase: 39 U/L (ref 38–126)
Anion gap: 14 (ref 5–15)
BUN: 18 mg/dL (ref 8–23)
CO2: 22 mmol/L (ref 22–32)
Calcium: 9.1 mg/dL (ref 8.9–10.3)
Chloride: 94 mmol/L — ABNORMAL LOW (ref 98–111)
Creatinine, Ser: 1.41 mg/dL — ABNORMAL HIGH (ref 0.44–1.00)
GFR, Estimated: 36 mL/min — ABNORMAL LOW (ref >60)
Glucose, Bld: 225 mg/dL — ABNORMAL HIGH (ref 70–99)
Potassium: 3.3 mmol/L — ABNORMAL LOW (ref 3.5–5.1)
Sodium: 130 mmol/L — ABNORMAL LOW (ref 135–145)
Total Bilirubin: 1.1 mg/dL (ref 0.3–1.2)
Total Protein: 5.2 g/dL — ABNORMAL LOW (ref 6.5–8.1)

## 2023-08-15 LAB — RESPIRATORY PANEL BY PCR

## 2023-08-15 LAB — URINALYSIS, W/ REFLEX TO CULTURE (INFECTION SUSPECTED)
Bilirubin Urine: NEGATIVE
Glucose, UA: 50 mg/dL — AB
Hgb urine dipstick: NEGATIVE
Ketones, ur: NEGATIVE mg/dL
Leukocytes,Ua: NEGATIVE
Nitrite: NEGATIVE
Protein, ur: 30 mg/dL — AB
Specific Gravity, Urine: 1.017 (ref 1.005–1.030)
pH: 5 (ref 5.0–8.0)

## 2023-08-15 LAB — RESP PANEL BY RT-PCR (RSV, FLU A&B, COVID)  RVPGX2
Influenza A by PCR: NEGATIVE
Influenza B by PCR: NEGATIVE
Resp Syncytial Virus by PCR: NEGATIVE
SARS Coronavirus 2 by RT PCR: NEGATIVE

## 2023-08-15 LAB — CBC WITH DIFFERENTIAL/PLATELET
Abs Immature Granulocytes: 0.1 10*3/uL — ABNORMAL HIGH (ref 0.00–0.07)
Basophils Absolute: 0 10*3/uL (ref 0.0–0.1)
Basophils Relative: 0 %
Eosinophils Absolute: 0 10*3/uL (ref 0.0–0.5)
Eosinophils Relative: 0 %
HCT: 36.6 % (ref 36.0–46.0)
Hemoglobin: 11.9 g/dL — ABNORMAL LOW (ref 12.0–15.0)
Immature Granulocytes: 1 %
Lymphocytes Relative: 11 %
Lymphs Abs: 1.4 10*3/uL (ref 0.7–4.0)
MCH: 29.4 pg (ref 26.0–34.0)
MCHC: 32.5 g/dL (ref 30.0–36.0)
MCV: 90.4 fL (ref 80.0–100.0)
Monocytes Absolute: 1.2 10*3/uL — ABNORMAL HIGH (ref 0.1–1.0)
Monocytes Relative: 9 %
Neutro Abs: 10.3 10*3/uL — ABNORMAL HIGH (ref 1.7–7.7)
Neutrophils Relative %: 79 %
Platelets: 249 10*3/uL (ref 150–400)
RBC: 4.05 MIL/uL (ref 3.87–5.11)
RDW: 13.5 % (ref 11.5–15.5)
WBC: 13 10*3/uL — ABNORMAL HIGH (ref 4.0–10.5)
nRBC: 0 % (ref 0.0–0.2)

## 2023-08-15 LAB — CBC
HCT: 34.9 % — ABNORMAL LOW (ref 36.0–46.0)
Hemoglobin: 11.2 g/dL — ABNORMAL LOW (ref 12.0–15.0)
MCH: 28.6 pg (ref 26.0–34.0)
MCHC: 32.1 g/dL (ref 30.0–36.0)
MCV: 89 fL (ref 80.0–100.0)
Platelets: 201 10*3/uL (ref 150–400)
RBC: 3.92 MIL/uL (ref 3.87–5.11)
RDW: 13.7 % (ref 11.5–15.5)
WBC: 14.3 10*3/uL — ABNORMAL HIGH (ref 4.0–10.5)
nRBC: 0 % (ref 0.0–0.2)

## 2023-08-15 LAB — BLOOD GAS, VENOUS
Acid-Base Excess: 0.5 mmol/L (ref 0.0–2.0)
Bicarbonate: 24.6 mmol/L (ref 20.0–28.0)
Drawn by: 27160
O2 Saturation: 59.5 %
Patient temperature: 36.6
pCO2, Ven: 36 mmHg — ABNORMAL LOW (ref 44–60)
pH, Ven: 7.44 — ABNORMAL HIGH (ref 7.25–7.43)
pO2, Ven: <31 mmHg — CL (ref 32–45)

## 2023-08-15 LAB — TROPONIN I (HIGH SENSITIVITY): Troponin I (High Sensitivity): 1566 ng/L (ref <18)

## 2023-08-15 LAB — BRAIN NATRIURETIC PEPTIDE: B Natriuretic Peptide: 538 pg/mL — ABNORMAL HIGH (ref 0.0–100.0)

## 2023-08-15 LAB — BASIC METABOLIC PANEL
Anion gap: 13 (ref 5–15)
BUN: 17 mg/dL (ref 8–23)
CO2: 21 mmol/L — ABNORMAL LOW (ref 22–32)
Calcium: 8.8 mg/dL — ABNORMAL LOW (ref 8.9–10.3)
Chloride: 98 mmol/L (ref 98–111)
Creatinine, Ser: 1.42 mg/dL — ABNORMAL HIGH (ref 0.44–1.00)
GFR, Estimated: 35 mL/min — ABNORMAL LOW (ref >60)
Glucose, Bld: 201 mg/dL — ABNORMAL HIGH (ref 70–99)
Potassium: 3.7 mmol/L (ref 3.5–5.1)
Sodium: 132 mmol/L — ABNORMAL LOW (ref 135–145)

## 2023-08-15 LAB — APTT: aPTT: 35 seconds (ref 24–36)

## 2023-08-15 LAB — LACTIC ACID, PLASMA
Lactic Acid, Venous: 5.1 mmol/L (ref 0.5–1.9)
Lactic Acid, Venous: 5.8 mmol/L (ref 0.5–1.9)
Lactic Acid, Venous: 5.3 mmol/L (ref 0.5–1.9)

## 2023-08-15 LAB — PROTIME-INR
INR: 1.5 — ABNORMAL HIGH (ref 0.8–1.2)
Prothrombin Time: 18.1 seconds — ABNORMAL HIGH (ref 11.4–15.2)

## 2023-08-15 LAB — MRSA NEXT GEN BY PCR, NASAL: MRSA by PCR Next Gen: NOT DETECTED

## 2023-08-15 LAB — GLUCOSE, CAPILLARY
Glucose-Capillary: 174 mg/dL — ABNORMAL HIGH (ref 70–99)
Glucose-Capillary: 196 mg/dL — ABNORMAL HIGH (ref 70–99)

## 2023-08-15 LAB — HEMOGLOBIN A1C
Hgb A1c MFr Bld: 7.7 % — ABNORMAL HIGH (ref 4.8–5.6)
Mean Plasma Glucose: 174.29 mg/dL

## 2023-08-15 MED ORDER — SODIUM CHLORIDE 0.9 % IV SOLN
2.0000 g | Freq: Once | INTRAVENOUS | Status: AC
  Administered 2023-08-15: 2 g via INTRAVENOUS
  Filled 2023-08-15: qty 12.5

## 2023-08-15 MED ORDER — SODIUM CHLORIDE 0.9% FLUSH
10.0000 mL | Freq: Two times a day (BID) | INTRAVENOUS | Status: DC
  Administered 2023-08-16 – 2023-08-18 (×4): 10 mL

## 2023-08-15 MED ORDER — METRONIDAZOLE 500 MG/100ML IV SOLN
500.0000 mg | Freq: Once | INTRAVENOUS | Status: AC
  Administered 2023-08-15: 500 mg via INTRAVENOUS
  Filled 2023-08-15: qty 100

## 2023-08-15 MED ORDER — VANCOMYCIN VARIABLE DOSE PER UNSTABLE RENAL FUNCTION (PHARMACIST DOSING): Status: DC

## 2023-08-15 MED ORDER — SODIUM CHLORIDE 0.9% FLUSH: 10.0000 mL | INTRAVENOUS | Status: DC | PRN

## 2023-08-15 MED ORDER — ACETAMINOPHEN 10 MG/ML IV SOLN
1000.0000 mg | Freq: Four times a day (QID) | INTRAVENOUS | Status: DC
  Filled 2023-08-15 (×2): qty 100

## 2023-08-15 MED ORDER — STERILE WATER FOR INJECTION IJ SOLN
INTRAMUSCULAR | Status: AC
  Filled 2023-08-15: qty 10

## 2023-08-15 MED ORDER — CHLORHEXIDINE GLUCONATE CLOTH 2 % EX PADS
6.0000 | MEDICATED_PAD | Freq: Every day | CUTANEOUS | Status: DC
  Administered 2023-08-15 – 2023-08-17 (×3): 6 via TOPICAL

## 2023-08-15 MED ORDER — LACTATED RINGERS IV SOLN: INTRAVENOUS | Status: AC

## 2023-08-15 MED ORDER — ACETAMINOPHEN 650 MG RE SUPP
650.0000 mg | Freq: Once | RECTAL | Status: AC
  Administered 2023-08-15: 650 mg via RECTAL
  Filled 2023-08-15: qty 1

## 2023-08-15 MED ORDER — LACTATED RINGERS IV BOLUS
1000.0000 mL | Freq: Once | INTRAVENOUS | Status: AC
  Administered 2023-08-15: 1000 mL via INTRAVENOUS

## 2023-08-15 MED ORDER — LACTATED RINGERS IV BOLUS (SEPSIS)
1000.0000 mL | Freq: Once | INTRAVENOUS | Status: AC
  Administered 2023-08-15: 1000 mL via INTRAVENOUS

## 2023-08-15 MED ORDER — ACETAMINOPHEN 10 MG/ML IV SOLN
1000.0000 mg | Freq: Once | INTRAVENOUS | Status: AC
  Administered 2023-08-16: 1000 mg via INTRAVENOUS
  Filled 2023-08-15: qty 100

## 2023-08-15 MED ORDER — ENOXAPARIN SODIUM 30 MG/0.3ML IJ SOSY
30.0000 mg | PREFILLED_SYRINGE | INTRAMUSCULAR | Status: DC
  Administered 2023-08-15: 30 mg via SUBCUTANEOUS
  Filled 2023-08-15: qty 0.3

## 2023-08-15 MED ORDER — INSULIN ASPART 100 UNIT/ML IJ SOLN
0.0000 [IU] | INTRAMUSCULAR | Status: DC
  Administered 2023-08-15: 3 [IU] via SUBCUTANEOUS
  Administered 2023-08-16: 8 [IU] via SUBCUTANEOUS
  Administered 2023-08-16: 5 [IU] via SUBCUTANEOUS
  Administered 2023-08-16 (×3): 2 [IU] via SUBCUTANEOUS
  Administered 2023-08-16: 3 [IU] via SUBCUTANEOUS
  Administered 2023-08-17 (×2): 5 [IU] via SUBCUTANEOUS

## 2023-08-15 MED ORDER — NOREPINEPHRINE 4 MG/250ML-% IV SOLN
2.0000 ug/min | INTRAVENOUS | Status: DC
  Administered 2023-08-15 – 2023-08-17 (×3): 2 ug/min via INTRAVENOUS
  Filled 2023-08-15 (×4): qty 250

## 2023-08-15 MED ORDER — SODIUM CHLORIDE 0.9 % IV SOLN: 250.0000 mL | INTRAVENOUS | Status: DC

## 2023-08-15 MED ORDER — SODIUM CHLORIDE 0.9 % IV SOLN
2.0000 g | INTRAVENOUS | Status: DC
  Administered 2023-08-16: 2 g via INTRAVENOUS
  Filled 2023-08-15: qty 12.5

## 2023-08-15 MED ORDER — VANCOMYCIN HCL IN DEXTROSE 1-5 GM/200ML-% IV SOLN
1000.0000 mg | Freq: Once | INTRAVENOUS | Status: AC
  Administered 2023-08-15: 1000 mg via INTRAVENOUS
  Filled 2023-08-15: qty 200

## 2023-08-15 MED ORDER — LIDOCAINE 5 % EX PTCH
1.0000 | MEDICATED_PATCH | CUTANEOUS | Status: DC
  Administered 2023-08-15 – 2023-08-16 (×2): 1 via TRANSDERMAL
  Filled 2023-08-15 (×2): qty 1

## 2023-08-15 MED ORDER — OLANZAPINE 5 MG PO TBDP
5.0000 mg | ORAL_TABLET | Freq: Once | ORAL | Status: AC
  Administered 2023-08-15: 5 mg via ORAL
  Filled 2023-08-15: qty 1

## 2023-08-15 MED ORDER — ZIPRASIDONE MESYLATE 20 MG IM SOLR
10.0000 mg | Freq: Once | INTRAMUSCULAR | Status: DC
  Filled 2023-08-15: qty 20

## 2023-08-15 MED ORDER — POLYETHYLENE GLYCOL 3350 17 G PO PACK: 17.0000 g | PACK | Freq: Every day | ORAL | Status: DC | PRN

## 2023-08-15 MED ORDER — POTASSIUM CHLORIDE 10 MEQ/100ML IV SOLN
10.0000 meq | INTRAVENOUS | Status: AC
  Administered 2023-08-15 – 2023-08-16 (×3): 10 meq via INTRAVENOUS
  Filled 2023-08-15 (×3): qty 100

## 2023-08-15 MED ORDER — DOCUSATE SODIUM 100 MG PO CAPS: 100.0000 mg | ORAL_CAPSULE | Freq: Two times a day (BID) | ORAL | Status: DC | PRN

## 2023-08-15 NOTE — Sepsis Progress Note (Signed)
Blood culture was very difficult to obtain, therefore antibiotic was started prior to Bcx as not to delay treatment.

## 2023-08-15 NOTE — ED Notes (Signed)
Lab has stuck pt 3 times for blood and they still are unable to get the second blood culture. I stuck her twice for the 2 IVs and couldn't get blood back except for in the catheter hub either time. We did get one blood culture after several attempts by nursing and lab when she first arrived. Lab says they have no one else to try and stick her for second blood culture. Dr. Wallace Cullens notified.

## 2023-08-15 NOTE — Consult Note (Signed)
Pharmacy Antibiotic Note  ASSESSMENT: 89 y.o. female with PMH polyneuropathy, T2DM, dCHF, HTN, HLD  is presenting with sepsis. Recent admission on 8/13 for UTI and per family she has declined since with poor PO intake. Rectal temp in triage was 105.4. Patient in AKI, and baseline Scr in 12/2022 was 0.9-1.0. Pharmacy has been consulted to manage cefepime and vancomycin dosing.  Patient measurements: Height: 5\' 6"  (167.6 cm) Weight: 64 kg (141 lb 1.5 oz) IBW/kg (Calculated) : 59.3  Vital signs: Temp: 105.4 F (40.8 C) (08/18 1029) Temp Source: Rectal (08/18 1029) BP: 109/91 (08/18 1030) Pulse Rate: 113 (08/18 1030) Recent Labs  Lab 08/10/23 1107  WBC 6.8  CREATININE 1.15*   Estimated Creatinine Clearance: 31 mL/min (A) (by C-G formula based on SCr of 1.15 mg/dL (H)).  Allergies: Allergies  Allergen Reactions   Augmentin [Amoxicillin-Pot Clavulanate] Hives   Other Hives and Itching    Red meat - hives, itching   Penicillins Hives and Rash    Antimicrobials this admission: Cefepime 8/18 >>  Vancomycin 8/18 >> Metronidazole 8/18 >>  Dose adjustments this admission: N/A  Microbiology results: 8/18 BCx: sent 8/18 UCx: sent 8/18 COVID/FLU/RSV: negative  8/18 MRSA PCR: ordered  PLAN: Initiate cefepime 2 g IV q24H Administer one time dose of vancomycin 1000 mg IV and obtain vancomycin level before re-dosing due to patient's AKI Follow up culture results to assess for antibiotic optimization. Monitor renal function to assess for any necessary antibiotic dosing changes.   Thank you for allowing pharmacy to be a part of this patient's care.  Will M. Dareen Piano, PharmD Clinical Pharmacist 08/15/2023 11:01 AM

## 2023-08-15 NOTE — ED Notes (Signed)
Dr. Wallace Cullens notified of BP of 90/50. Order placed for 1L LR bolus.

## 2023-08-15 NOTE — Progress Notes (Addendum)
[facility] Physician-Brief Progress Note Patient Name: Linda Hess DOB: Oct 29, 1931 MRN: [id number]   Date of Service  08/15/2023  HPI/Events of Note  89 year old female with a history of chronic inflammatory demyelinating polyneuropathy, type 2 diabetes who initially presented with acute encephalopathy and fever up to 105.4 with previous left shin surgery who has been in significant pain, undifferentiated throughout the body but is concurrently sleepy.  Unable to swallow oral medications.  At home, she takes lidocaine to the hips.  No additional pain meds at home.  -Lactic acid 5.3 Troponin 1566  Last EKG with inferior changes but no STEMI.  eICU Interventions  One-time IV acetaminophen  Add on lidocaine as needed to the hips  Trend labs.  Repeat EKG.   2202 -EKG without evidence of acute ischemia.  The previous ST changes are corrected.  Corrected Tylenol ordered to be one-time rather than every 6 hours.  Intervention Category Intermediate Interventions: Pain - evaluation and management  Linda Hess 08/15/2023, 8:12 PM

## 2023-08-15 NOTE — Progress Notes (Signed)
Received patient to unit at this time.  No signs of distress noted.  Appears ill.  MD at bedside assessing patient.  Left AC Flushed with blood return noted with levo infusing at .  Left FA flushed with LR @150ml /hr.  Right PIV flushed and saline locked.  Small skin tear noted to left shin.  Lower legs discolored but warm and dry to touch palpable pulses.  Sacrum red and blanchable foam applied.  BP cuff was moved from right wrist to right upper arm for BP accuracy.  RVP and MRSA screen sent to lab.  CHG bath given.  Son Raiford Noble at bedside and updated on current orders and unit routines.

## 2023-08-15 NOTE — Sepsis Progress Note (Signed)
Sepsis protocol is being followed by [facility]. 

## 2023-08-15 NOTE — H&P (Signed)
NAME:  Linda Hess, MRN:  [id number], DOB:  1931-01-04, LOS: 0 ADMISSION DATE:  08/15/2023, CONSULTATION DATE:  08/15/2023 REFERRING MD:  Dr. Wallace Cullens - EDP, CHIEF COMPLAINT:  Acute respiratory failure    History of Present Illness:  Linda Hess is a 89 y.o. female with a past medical history for chronic inflammatory demyelinating polyneuropathy, HTN, HLD, prior DVT, and type 2 diabetes who presented to the ED at [facility] 8/18 for altered mental status and fever.  Of note patient was recently seen in ED for UTI.  On arrival to ED patient was seen with temperature of 105.4, tachypnea, tachycardia, and mild hypotension.  Lab work on admission significant for NA 130, K3.3, glucose 225, creatinine 1.41, albumin 2.4, BNP 538, lactic acid 5.1, WBC 13.0.  Chest x-ray significant for likely right middle and upper lobe pneumonia.  UA on admission with many bacteria but no nitrates or leukocytes.  Given hypotension patient was started on vasopressors, PCCM consulted for further management admission to [facility].  Upon arrival to [facility], ICU bed to obtain patient was oriented and found to be frail.  Nasal cannula oxygen and peripheral Levophed.  Pertinent  Medical History  Chronic inflammatory demyelinating polyneuropathy, HTN, HLD, prior DVT, and type 2 diabetes  Significant Hospital Events: Including procedures, antibiotic start and stop dates in addition to other pertinent events   8/18 presented with altered mental status and fever with Tmax 105.6 admitted for septic shock secondary to likely pneumonia and UTI  Interim History / Subjective:  As above  Objective   Blood pressure (!) 91/53, pulse 87, temperature 98.3 F (36.8 C), temperature source Oral, resp. rate (!) 26, height 5\' 6"  (1.676 m), weight 64 kg, SpO2 99%.        Intake/Output Summary (Last 24 hours) at 08/15/2023 1622 Last data filed at 08/15/2023 1538 Gross per 24 hour  Intake 2394.06 ml  Output --  Net 2394.06 ml    Filed Weights   08/15/23 1029  Weight: 64 kg    Examination: General Appearance:  Looks very frail. Buck Mam skin everywhere.  She finds everything cold to touch and she moans when touched but it is not from pain. Head:  Normocephalic, without obvious abnormality, atraumatic Eyes:  PERRL - yes, conjunctiva/corneas - muddu     Ears:  Normal external ear canals, both ears Nose:  G tube - no but has N o2 Throat:  ETT TUBE - no , OG tube - no Neck:  Supple,  No enlargement/tenderness/nodules Lungs: Clear to auscultation bilaterally, Heart:  S1 and S2 normal, no murmur, CVP - no.  Pressors - yes via PI levophed Abdomen:  Soft, no masses, no organomegaly Genitalia / Rectal:  Not done Extremities:  Extremities- 'There is a skin tear in the left shin overall significant. Intact in exposed areas . Sacral area - no decub..  She has venous stasis edema with discoloration in her lower extremities.  Likewise in the upper extremities 2. Neurologic:  Sedation -none-> RASS -+1. Moves all 4s -yes. CAM-ICU -good for delirium. Orientation -fully oriented    Resolved Hospital Problem list     Assessment & Plan:  Septic shock secondary to pneumonia and likely UTI: -Chest x-ray on admission concerning for right upper and middle lobe pneumonia.  UA also positive for many bacteria but no nitrates or leukocytes    P: Pan cultures prior to antibiotic Broad IV antibiotics including cefepime and vancomycin Aggressive IV hydration Pressors for MAP goal greater  than 65 Trend lactic acid Monitor urine output  Acute respiratory failure due to above - 2L [employer] need at admit 08/15/2023  Plan  - o2 for pulse ox goal > 95%   Acute Kidney Injury : -Creatinine on admission 1.41 with baseline normal renal function creat 0.9mg %  P: Follow renal function  Monitor urine output Trend Bmet Avoid nephrotoxins Ensure adequate renal perfusion  Aggressive IV hydration provided on admission  HFpEF -Echo October  2023 with preserved EF of 60 to 65%, no WMA, and grade 2 diastolic dysfunction Essential hypertension Hyperlipidemia    P: Close monitoring of volume status Trend BNP Strict intake and output Daily weight GDMT as able Hold home diuretics  History of DVT anticoagulated at baseline with Xarelto P: Await medication reconciliation and resume home anticoagulation when appropriate  Hyponatremia Hypokalemia P: Trend to be met Supplement as needed  Type 2 diabetes -Most recent hemoglobin A1c 8.01 January 2023.  Home medications include metformin and Januvia P: SSI CBG goal 140-180 Hold home medications  Wheel chair dependent at baseline - per son x 18 months Chronic Inflammatory Demyelinating NEuropathy - at home. Last week decline. Significant increase in caregiver burden. She lives alone  Plan  - SNF/LTAC placement requesed by son   Lactic acidosis at admit  Plan  - track with fluids/abx  Best Practice (right click and "Reselect all SmartList Selections" daily)   Diet/type: NPO but for meds. Rx with D5 in fluid DVT prophylaxis: DOAC GI prophylaxis: PPI Lines: N/A Foley:  N/A Code Status:  full code Last date of multidisciplinary goals of care discussion: Pending  08/15/23:  Nettye Kutter: [phone]   ATTESTATION & SIGNATURE   The patient Linda Hess is critically ill with multiple organ systems failure and requires high complexity decision making for assessment and support, frequent evaluation and titration of therapies, application of advanced monitoring technologies and extensive interpretation of multiple databases and discussion with other appropriate health care personnel such as bedside nurses, social workers, case Production designer, theatre/television/film, consultants, respiratory therapists, nutritionists, secretaries etc.,  Critical care time includes but is not restricted to just documentation time. Documentation can happen in parallel or sequential to care time depending on case mix  urgency and priorities for the shift. So, overall critical Care Time devoted to patient care services described in this note is  40  Minutes.   This time reflects time of care of this signee Dr Kalman Shan which includ does not reflect procedure time, or teaching time or supervisory time of PA/NP/Med student/Med Resident etc but could involve care discussion time     Dr. Kalman Shan, M.D., [facility].C.P Pulmonary and Critical Care Medicine Staff Physician, [facility] Pager: [phone], If no answer or between  15:00h - 7:00h: call [phone]  08/15/2023 6:43 PM    LABS    PULMONARY Recent Labs  Lab 08/15/23 1306  HCO3 24.6  O2SAT 59.5    CBC Recent Labs  Lab 08/10/23 1107 08/15/23 1148  HGB 13.1 11.9*  HCT 40.6 36.6  WBC 6.8 13.0*  PLT 214 249    COAGULATION Recent Labs  Lab 08/15/23 1148  INR 1.5*    CARDIAC  No results for input(s): "TROPONINI" in the last 168 hours. No results for input(s): "PROBNP" in the last 168 hours.   CHEMISTRY Recent Labs  Lab 08/10/23 1107 08/15/23 1148  NA 132* 130*  K 3.5 3.3*  CL 94* 94*  CO2 24 22  GLUCOSE 124* 225*  BUN 17 18  CREATININE 1.15* 1.41*  CALCIUM 9.4 9.1  MG 1.7  --    Estimated Creatinine Clearance: 25.3 mL/min (A) (by C-G formula based on SCr of 1.41 mg/dL (H)).   LIVER Recent Labs  Lab 08/10/23 1107 08/15/23 1148  AST 24 39  ALT 14 17  ALKPHOS 41 39  BILITOT 2.0* 1.1  PROT 5.9* 5.2*  ALBUMIN 3.0* 2.4*  INR  --  1.5*     INFECTIOUS Recent Labs  Lab 08/10/23 1326 08/15/23 1148 08/15/23 1306  LATICACIDVEN 1.6 5.1* 5.8*     ENDOCRINE CBG (last 3)  Recent Labs    08/15/23 1825  GLUCAP 174*         IMAGING x48h  - image(s) personally visualized  -   highlighted in bold [place] EKG SITE RITE  Result Date: 08/15/2023 If Site Rite image not attached, placement could not be confirmed due to current cardiac rhythm.  DG  Chest Port 1 View  Result Date: 08/15/2023 CLINICAL DATA:  Possible sepsis. EXAM: PORTABLE CHEST 1 VIEW COMPARISON:  08/10/2023 FINDINGS: New patchy airspace disease is seen in the right mid and upper lung. Interstitial markings are diffusely coarsened with chronic features. No pleural effusion. The cardio pericardial silhouette is enlarged. Bones are diffusely demineralized. IMPRESSION: New patchy airspace disease in the right mid and upper lung. Imaging features compatible with pneumonia. Electronically Signed   By: Kennith Center M.D.   On: 08/15/2023 12:49

## 2023-08-15 NOTE — ED Notes (Signed)
[facility] leaving with pt at this time to transfer to [facility].

## 2023-08-15 NOTE — Progress Notes (Signed)
Assessed for ultrasound guided PIV for pressor. No suitable vein found. Pt waiting for PICC line placement. RN aware.

## 2023-08-15 NOTE — Progress Notes (Signed)
RE [name] 10/02/1931 [id number] - MRN    S: Call from Dr Wallace Cullens to PCCM on cal in [facility]  Linda Hess lives alone. Preents with feve 105, 85% RA, Low BP > got tylenol > tem 102F and confusion better. CXR shows pneumonia. Got Vanc, cefepime and flagyl. Lactte 5.8 -> got 2L fluids. On PIV levophed     has a past medical history of CIDP (chronic inflammatory demyelinating polyneuropathy) (HCC), Essential hypertension, benign, History of DVT (deep vein thrombosis), Mixed hyperlipidemia, and Type 2 diabetes mellitus (HCC).   reports that she has never smoked. She has never used smokeless tobacco.  Past Surgical History:  Procedure Laterality Date   CHOLECYSTECTOMY     Left quadriceps muscle biopsy  March 2010   Right knee replacement  September 2014    Allergies  Allergen Reactions   Augmentin [Amoxicillin-Pot Clavulanate] Hives   Other Hives and Itching    Red meat - hives, itching   Penicillins Hives and Rash    Immunization History  Administered Date(s) Administered   Tdap 07/04/2020    Family History  Problem Relation Age of Onset   Heart attack Father    Stroke Mother    Cancer Brother      Current Facility-Administered Medications:    0.9 %  sodium chloride infusion, 250 mL, Intravenous, Continuous, Sloan Leiter, DO   [START ON 08/16/2023] ceFEPIme (MAXIPIME) 2 g in sodium chloride 0.9 % 100 mL IVPB, 2 g, Intravenous, Q24H, Orson Aloe, RPH   lactated ringers infusion, , Intravenous, Continuous, Sloan Leiter, DO, Last Rate: 150 mL/hr at 08/15/23 1311, New Bag at 08/15/23 1311   norepinephrine (LEVOPHED) 4mg  in (0.016 mg/mL) premix infusion, 2-10 mcg/min, Intravenous, Titrated, Sloan Leiter, DO, Last Rate: 22.5 mL/hr at 08/15/23 1616, 6 mcg/min at 08/15/23 1616   vancomycin variable dose per unstable renal function (pharmacist dosing), , Does not apply, See admin instructions, Orson Aloe, RPH   ziprasidone  (GEODON) injection 10 mg, 10 mg, Intramuscular, Once, Sloan Leiter, DO  Current Outpatient Medications:    Calcium Carb-Cholecalciferol (OYSTER SHELL CALCIUM/VIT D3) 500-5 MG-MCG TABS, Take 1 tablet by mouth at bedtime., Disp: , Rfl:    famotidine (PEPCID) 20 MG tablet, Take 20 mg by mouth 2 (two) times daily., Disp: , Rfl:    furosemide (LASIX) 20 MG tablet, Take 20 mg by mouth daily., Disp: , Rfl:    gabapentin (NEURONTIN) 100 MG capsule, Take 2 capsules (200 mg total) by mouth 3 (three) times daily., Disp: 60 capsule, Rfl: 0   JANUVIA 100 MG tablet, Take 100 mg by mouth daily., Disp: , Rfl:    lidocaine (LIDODERM) 5 %, Place 1 patch onto the skin daily. Remove & Discard patch within 12 hours or as directed by MD.  Apply to hip (Patient taking differently: Place 1 patch onto the skin daily as needed (pain).), Disp: 30 patch, Rfl: 0   metFORMIN (GLUCOPHAGE) 1000 MG tablet, Take 1 tablet (1,000 mg total) by mouth daily with breakfast. (Patient taking differently: Take 1,000 mg by mouth 2 (two) times daily.), Disp: 30 tablet, Rfl: 3   Omega-3 Fatty Acids (FISH OIL PO), Take 1 capsule by mouth daily. Fish oil 360-1200mg  DR, Disp: , Rfl:    oxybutynin (DITROPAN) 5 MG tablet, Take 5 mg by mouth 2 (two) times daily., Disp: , Rfl:    predniSONE (DELTASONE) 10 MG tablet, Take 1 tablet (10 mg total) by mouth daily  with breakfast., Disp: 30 tablet, Rfl: 3   rivaroxaban (XARELTO) 10 MG TABS tablet, Take 1 tablet (10 mg total) by mouth daily., Disp: 30 tablet, Rfl: 1   rosuvastatin (CRESTOR) 10 MG tablet, Take 10 mg by mouth daily., Disp: , Rfl:    traMADol (ULTRAM) 50 MG tablet, Take 1 tablet (50 mg total) by mouth every 8 (eight) hours as needed for moderate pain., Disp: 10 tablet, Rfl: 0   vitamin C (ASCORBIC ACID) 500 MG tablet, Take 500 mg by mouth daily., Disp: , Rfl:     OBJECTIVE     08/15/2023    4:15 PM 08/15/2023    4:14 PM 08/15/2023    4:11 PM  Vitals with BMI  Systolic 91 80 68   Diastolic 53 51 47  Pulse 87 89 82    96% on 3L Linda Hess normally No focal deficits No overt reespiratory distress   LABS    PULMONARY Recent Labs  Lab 08/15/23 1306  HCO3 24.6  O2SAT 59.5    CBC Recent Labs  Lab 08/10/23 1107 08/15/23 1148  HGB 13.1 11.9*  HCT 40.6 36.6  WBC 6.8 13.0*  PLT 214 249    COAGULATION Recent Labs  Lab 08/15/23 1148  INR 1.5*    CARDIAC  No results for input(s): "TROPONINI" in the last 168 hours. No results for input(s): "PROBNP" in the last 168 hours.   CHEMISTRY Recent Labs  Lab 08/10/23 1107 08/15/23 1148  NA 132* 130*  K 3.5 3.3*  CL 94* 94*  CO2 24 22  GLUCOSE 124* 225*  BUN 17 18  CREATININE 1.15* 1.41*  CALCIUM 9.4 9.1  MG 1.7  --    Estimated Creatinine Clearance: 25.3 mL/min (A) (by C-G formula based on SCr of 1.41 mg/dL (H)).   LIVER Recent Labs  Lab 08/10/23 1107 08/15/23 1148  AST 24 39  ALT 14 17  ALKPHOS 41 39  BILITOT 2.0* 1.1  PROT 5.9* 5.2*  ALBUMIN 3.0* 2.4*  INR  --  1.5*     INFECTIOUS Recent Labs  Lab 08/10/23 1326 08/15/23 1148 08/15/23 1306  LATICACIDVEN 1.6 5.1* 5.8*     ENDOCRINE CBG (last 3)  No results for input(s): "GLUCAP" in the last 72 hours.       IMAGING x48h  - image(s) personally visualized  -   highlighted in bold DG Chest Port 1 View  Result Date: 08/15/2023 CLINICAL DATA:  Possible sepsis. EXAM: PORTABLE CHEST 1 VIEW COMPARISON:  08/10/2023 FINDINGS: New patchy airspace disease is seen in the right mid and upper lung. Interstitial markings are diffusely coarsened with chronic features. No pleural effusion. The cardio pericardial silhouette is enlarged. Bones are diffusely demineralized. IMPRESSION: New patchy airspace disease in the right mid and upper lung. Imaging features compatible with pneumonia. Electronically Signed   By: Kennith Center M.D.   On: 08/15/2023 12:49      A) septic shock Pneumnia Aki Lactic acidosis Acute resp failure 3L  [place] AT risk for ALI  Plan  - give 3rd-4th liter fluids - continue levophed PIV - admit [place] [facility] - accepted      SIGNATURE    Dr. Kalman Shan, M.D., F.C.C.P,  Pulmonary and Critical Care Medicine Staff Physician, [facility] Director - Interstitial Lung Disease  Program  Pulmonary Fibrosis [facility] at [facility] [place], [place], [address]   Pager: [phone], If no answer  -> Check AMION or Try [phone] Telephone (clinical office): [phone] Telephone (research): [phone]  4:19 PM 08/15/2023

## 2023-08-15 NOTE — ED Provider Notes (Signed)
[facility] 81[facility] MEDICAL ICU Provider Note  CSN: [id number] Arrival date & time: 08/15/23 1011  Chief Complaint(s) Fever  HPI Linda Hess is a 89 y.o. female with past medical history as below, significant for polyneuropathy, hypertension, HLD, type II DM, diastolic heart failure, who presents to the ED with complaint of AMS, fever.  Per EMS patient lives alone, currently reports reduced p.o. intake last few days, recent diagnosis of UTI.  He also report the patient was recently diagnosed with dementia.  Seen 8/13 following a fall, altered mental status, discharged in stable condition  Level 5 caveat, AMS  Past Medical History Past Medical History:  Diagnosis Date   CIDP (chronic inflammatory demyelinating polyneuropathy) (HCC)    Essential hypertension, benign    pt denies   History of DVT (deep vein thrombosis)    Reportedly left leg, details not clear   Mixed hyperlipidemia    Type 2 diabetes mellitus (HCC)    Patient Active Problem List   Diagnosis Date Noted   Septic shock (HCC) 08/15/2023   Sepsis (HCC) 08/15/2023   Chronic diastolic CHF (congestive heart failure) (HCC) 01/27/2023   Venous ulcer (HCC) 12/13/2022   Lymphedema 12/13/2022   Pressure injury of skin 10/22/2022   Acute on chronic diastolic CHF (congestive heart failure) (HCC) 10/21/2022   Fall 10/20/2022   Lymphedema of lower extremity 10/12/2022   Acute back pain with sciatica 10/06/2022   Right hip pain 10/06/2022   Lumbar spondylosis 10/06/2022   Spinal stenosis of lumbar region 10/06/2022   Diabetic peripheral neuropathy (HCC) 03/24/2022   Foot pain 03/24/2022   Hallux valgus 03/24/2022   Ingrowing toenail 03/24/2022   Tibialis posterior tendinitis 03/24/2022   Leg swelling 07/24/2021   DVT (deep venous thrombosis) (HCC) 07/24/2021   Cellulitis of right upper extremity 12/14/2018   Peripheral arterial disease (HCC) 03/14/2014   CIDP (chronic inflammatory demyelinating  polyneuropathy) (HCC) 03/14/2014   Mixed hyperlipidemia 03/12/2014   Essential hypertension, benign 03/12/2014   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 Authorizing Provider  Calcium Carb-Cholecalciferol (OYSTER SHELL CALCIUM/VIT D3) 500-5 MG-MCG TABS Take 1 tablet by mouth at bedtime.   Yes [provider]  famotidine (PEPCID) 20 MG tablet Take 20 mg by mouth 2 (two) times daily.   Yes [provider]  furosemide (LASIX) 20 MG tablet Take 20 mg by mouth daily.   Yes [provider]  gabapentin (NEURONTIN) 100 MG capsule Take 2 capsules (200 mg total) by mouth 3 (three) times daily. 10/25/22  Yes Enedina Finner, MD  JANUVIA 100 MG tablet Take 100 mg by mouth daily. 03/21/20  Yes [provider]  lidocaine (LIDODERM) 5 % Place 1 patch onto the skin daily. Remove & Discard patch within 12 hours or as directed by MD.  Apply to hip Patient taking differently: Place 1 patch onto the skin daily as needed (pain). 01/28/23  Yes Hollice Espy, MD  metFORMIN (GLUCOPHAGE) 1000 MG tablet Take 1 tablet (1,000 mg total) by mouth daily with breakfast. Patient taking differently: Take 1,000 mg by mouth 2 (two) times daily. 01/28/23  Yes Hollice Espy, MD  Omega-3 Fatty Acids (FISH OIL PO) Take 1 capsule by mouth daily. Fish oil 360-1200mg  DR   Yes [provider]  oxybutynin (DITROPAN) 5 MG tablet Take 5 mg by  mouth 2 (two) times daily.   Yes [provider]  predniSONE (DELTASONE) 10 MG tablet Take 1 tablet (10 mg total) by mouth daily with breakfast. 07/25/21  Yes Emokpae, Courage, MD  rivaroxaban (XARELTO) 10 MG TABS tablet Take 1 tablet (10 mg total) by mouth daily. 10/26/22  Yes Enedina Finner, MD  rosuvastatin (CRESTOR) 10 MG tablet Take 10 mg by mouth daily. 03/30/19  Yes  [provider]  traMADol (ULTRAM) 50 MG tablet Take 1 tablet (50 mg total) by mouth every 8 (eight) hours as needed for moderate pain. 01/28/23  Yes Hollice Espy, MD  vitamin C (ASCORBIC ACID) 500 MG tablet Take 500 mg by mouth daily.   Yes [provider]                                                                                                                                    Past Surgical History Past Surgical History:  Procedure Laterality Date   CHOLECYSTECTOMY     Left quadriceps muscle biopsy  March 2010   Right knee replacement  September 2014   Family History Family History  Problem Relation Age of Onset   Heart attack Father    Stroke Mother    Cancer Brother     Social History Social History   Tobacco Use   Smoking status: Never   Smokeless tobacco: Never  Vaping Use   Vaping status: Never Used  Substance Use Topics   Alcohol use: No   Drug use: No   Allergies Augmentin [amoxicillin-pot clavulanate], Other, and Penicillins  Review of Systems Review of Systems  Unable to perform ROS: Mental status change    Physical Exam Vital Signs  I have reviewed the triage vital signs BP (!) 107/57   Pulse 80   Temp 98.3 F (36.8 C) (Axillary)   Resp (!) 25   Ht 5\' 6"  (1.676 m)   Wt 64 kg   SpO2 98%   BMI 22.77 kg/m  Physical Exam Vitals and nursing note reviewed. Exam conducted with a chaperone present.  Constitutional:      Appearance: She is ill-appearing and diaphoretic.     Comments: Moaning, warm to the touch, rigors  HENT:     Head: Normocephalic and atraumatic.     Right Ear: External ear normal.     Left Ear: External ear normal.     Mouth/Throat:     Mouth: Mucous membranes are dry.  Eyes:     General: No scleral icterus.       Right eye: No discharge.        Left eye: No discharge.  Cardiovascular:     Rate and Rhythm: Regular rhythm. Tachycardia present.     Pulses: Normal pulses.     Heart sounds: Normal  heart sounds. No murmur heard. Pulmonary:     Effort: Pulmonary effort is normal. Tachypnea present.  Breath sounds: Normal breath sounds.  Abdominal:     General: Abdomen is flat.     Palpations: Abdomen is soft.     Tenderness: There is no abdominal tenderness.  Musculoskeletal:     Cervical back: No rigidity.  Skin:    General: Skin is warm.     Capillary Refill: Capillary refill takes 2 to 3 seconds.     Coloration: Skin is not jaundiced.     Comments: Stool noted on legs and on clothes, appears brown  Neurological:     Mental Status: She is alert. She is disoriented and confused.     ED Results and Treatments Labs (all labs ordered are listed, but only abnormal results are displayed) Labs Reviewed  LACTIC ACID, PLASMA - Abnormal; Notable for the following components:      Result Value   Lactic Acid, Venous 5.1 (*)    All other components within normal limits  LACTIC ACID, PLASMA - Abnormal; Notable for the following components:   Lactic Acid, Venous 5.8 (*)    All other components within normal limits  COMPREHENSIVE METABOLIC PANEL - Abnormal; Notable for the following components:   Sodium 130 (*)    Potassium 3.3 (*)    Chloride 94 (*)    Glucose, Bld 225 (*)    Creatinine, Ser 1.41 (*)    Total Protein 5.2 (*)    Albumin 2.4 (*)    GFR, Estimated 36 (*)    All other components within normal limits  CBC WITH DIFFERENTIAL/PLATELET - Abnormal; Notable for the following components:   WBC 13.0 (*)    Hemoglobin 11.9 (*)    Neutro Abs 10.3 (*)    Monocytes Absolute 1.2 (*)    Abs Immature Granulocytes 0.10 (*)    All other components within normal limits  PROTIME-INR - Abnormal; Notable for the following components:   Prothrombin Time 18.1 (*)    INR 1.5 (*)    All other components within normal limits  URINALYSIS, W/ REFLEX TO CULTURE (INFECTION SUSPECTED) - Abnormal; Notable for the following components:   Color, Urine AMBER (*)    APPearance HAZY (*)     Glucose, UA 50 (*)    Protein, ur 30 (*)    Bacteria, UA MANY (*)    All other components within normal limits  BLOOD GAS, VENOUS - Abnormal; Notable for the following components:   pH, Ven 7.44 (*)    pCO2, Ven 36 (*)    pO2, Ven <31 (*)    All other components within normal limits  BRAIN NATRIURETIC PEPTIDE - Abnormal; Notable for the following components:   B Natriuretic Peptide 538.0 (*)    All other components within normal limits  CBC - Abnormal; Notable for the following components:   WBC 14.3 (*)    Hemoglobin 11.2 (*)    HCT 34.9 (*)    All other components within normal limits  GLUCOSE, CAPILLARY - Abnormal; Notable for the following components:   Glucose-Capillary 174 (*)    All other components within normal limits  BASIC METABOLIC PANEL - Abnormal; Notable for the following components:   Sodium 132 (*)    CO2 21 (*)    Glucose, Bld 201 (*)    Creatinine, Ser 1.42 (*)    Calcium 8.8 (*)    GFR, Estimated 35 (*)    All other components within normal limits  LACTIC ACID, PLASMA - Abnormal; Notable for the following components:   Lactic Acid, Venous 5.3 (*)  All other components within normal limits  HEMOGLOBIN A1C - Abnormal; Notable for the following components:   Hgb A1c MFr Bld 7.7 (*)    All other components within normal limits  GLUCOSE, CAPILLARY - Abnormal; Notable for the following components:   Glucose-Capillary 196 (*)    All other components within normal limits  TROPONIN I (HIGH SENSITIVITY) - Abnormal; Notable for the following components:   Troponin I (High Sensitivity) 1,566 (*)    All other components within normal limits  RESP PANEL BY RT-PCR (RSV, FLU A&B, COVID)  RVPGX2  CULTURE, BLOOD (ROUTINE X 2)  RESPIRATORY PANEL BY PCR  MRSA NEXT GEN BY PCR, NASAL  MRSA NEXT GEN BY PCR, NASAL  APTT  LEGIONELLA PNEUMOPHILA SEROGP 1 UR AG  STREP PNEUMONIAE URINARY ANTIGEN  PROCALCITONIN  CBC  BASIC METABOLIC PANEL  MAGNESIUM  PHOSPHORUS  LACTIC  ACID, PLASMA  URINALYSIS, W/ REFLEX TO CULTURE (INFECTION SUSPECTED)  LACTIC ACID, PLASMA  TROPONIN I (HIGH SENSITIVITY)                                                                                                                          Radiology [place] EKG SITE RITE  Result Date: 08/15/2023 If Site Rite image not attached, placement could not be confirmed due to current cardiac rhythm.  DG Chest Port 1 View  Result Date: 08/15/2023 CLINICAL DATA:  Possible sepsis. EXAM: PORTABLE CHEST 1 VIEW COMPARISON:  08/10/2023 FINDINGS: New patchy airspace disease is seen in the right mid and upper lung. Interstitial markings are diffusely coarsened with chronic features. No pleural effusion. The cardio pericardial silhouette is enlarged. Bones are diffusely demineralized. IMPRESSION: New patchy airspace disease in the right mid and upper lung. Imaging features compatible with pneumonia. Electronically Signed   By: Kennith Center M.D.   On: 08/15/2023 12:49    Pertinent labs & imaging results that were available during my care of the patient were reviewed by me and considered in my medical decision making (see MDM for details).  Medications Ordered in ED Medications  lactated ringers infusion ( Intravenous Rate/Dose Change 08/15/23 1951)  ceFEPIme (MAXIPIME) 2 g in sodium chloride 0.9 % 100 mL IVPB (has no administration in time range)  vancomycin variable dose per unstable renal function (pharmacist dosing) (has no administration in time range)  0.9 %  sodium chloride infusion (0 mLs Intravenous Hold 08/15/23 1621)  norepinephrine (LEVOPHED) 4mg  in (0.016 mg/mL) premix infusion (2 mcg/min Intravenous Infusion Verify 08/15/23 1815)  docusate sodium (COLACE) capsule 100 mg (has no administration in time range)  polyethylene glycol (MIRALAX / GLYCOLAX) packet 17 g (has no administration in time range)  enoxaparin (LOVENOX) injection 30 mg (has no administration in time range)  potassium chloride 10  mEq in 100 mL IVPB (has no administration in time range)  insulin aspart (novoLOG) injection 0-15 Units (has no administration in time range)  Chlorhexidine Gluconate Cloth 2 % PADS 6 each (6 each Topical Given  08/15/23 1820)  lidocaine (LIDODERM) 5 % 1 patch (has no administration in time range)  acetaminophen (OFIRMEV) IV 1,000 mg (has no administration in time range)  lactated ringers bolus 1,000 mL (0 mLs Intravenous Stopped 08/15/23 1300)  ceFEPIme (MAXIPIME) 2 g in sodium chloride 0.9 % 100 mL IVPB (0 g Intravenous Stopped 08/15/23 1209)  metroNIDAZOLE (FLAGYL) IVPB 500 mg (0 mg Intravenous Stopped 08/15/23 1349)  vancomycin (VANCOCIN) IVPB 1000 mg/200 mL premix (0 mg Intravenous Stopped 08/15/23 1528)  acetaminophen (TYLENOL) suppository 650 mg (650 mg Rectal Given 08/15/23 1054)  lactated ringers bolus 1,000 mL (0 mLs Intravenous Stopped 08/15/23 1400)  lactated ringers bolus 1,000 mL (1,000 mLs Intravenous Transfusing/Transfer 08/15/23 1731)  OLANZapine zydis (ZYPREXA) disintegrating tablet 5 mg (5 mg Oral Given 08/15/23 1655)                                                                                                                                     Procedures .Critical Care  Performed by: Sloan Leiter, DO Authorized by: Sloan Leiter, DO   Critical care provider statement:    Critical care time (minutes):  49   Critical care time was exclusive of:  Separately billable procedures and treating other patients   Critical care was necessary to treat or prevent imminent or life-threatening deterioration of the following conditions:  Sepsis, shock and respiratory failure   Critical care was time spent personally by me on the following activities:  Development of treatment plan with patient or surrogate, discussions with consultants, evaluation of patient's response to treatment, examination of patient, ordering and review of laboratory studies, ordering and review of radiographic studies,  ordering and performing treatments and interventions, pulse oximetry, re-evaluation of patient's condition, review of old charts and obtaining history from patient or surrogate   Care discussed with: admitting provider     Emergency Ultrasound Study:   Angiocath insertion Performed by: Sloan Leiter DO   Consent: Verbal consent obtained. Risks and benefits: risks, benefits and alternatives were discussed Immediately prior to procedure the correct patient, procedure, equipment, support staff and site/side marked as needed.  Indication: difficult IV access Preparation: Patient was prepped and draped in the usual sterile fashion. Vein Location: The left median cubital vein was visualized during assessment for potential access sites and was found to be patent/ easily compressed with linear ultrasound.  The needle was visualized with real-time ultrasound and guided into the vein. Gauge: 18  Images were not saved due to the time sensitive nature of the situation. Normal blood return.  Patient tolerance: Patient tolerated the procedure well with no immediate complications.    (including critical care time)  Medical Decision Making / ED Course    Medical Decision Making:    IKHLAS VAGNONI is a 89 y.o. female  with past medical history as below, significant for polyneuropathy, hypertension, HLD, type II DM, diastolic heart failure, who presents  to the ED with complaint of AMS, fever.. The complaint involves an extensive differential diagnosis and also carries with it a high risk of complications and morbidity.  Serious etiology was considered. Ddx includes but is not limited to: Differential diagnosis for adult fever includes but is not exclusive to community-acquired pneumonia, urinary tract infection, acute cholecystitis, viral syndrome, cellulitis, tick bourne disease,  decubitus ulcer, necrotizing fasciitis, meningitis, encephalitis, influenza, etc. Differential diagnoses for altered  mental status includes but is not exclusive to alcohol, illicit or prescription medications, intracranial pathology such as stroke, intracerebral hemorrhage, fever or infectious causes including sepsis, hypoxemia, uremia, trauma, endocrine related disorders such as diabetes, hypoglycemia, thyroid-related diseases, etc.   Complete initial physical exam performed, notably the patient  was febrile, tachycardic, tachypneic, hypoxia 87 to 89% on room air.  Fever 105.4.    Reviewed and confirmed nursing documentation for past medical history, family history, social history.  Vital signs reviewed.    Narrative: 89 year old female history as above including diastolic heart failure, hypertension, type 2 diabetes here with altered mental status, fever Meets for SIRS criteria on arrival with tachycardia, tachypnea, fever; clear source of infection Code sepsis was activated and sepsis bundle ordered Patient requiring 2 L nasal cannula  Clinical Course as of 08/15/23 2030  Sun Aug 15, 2023  1524 U/s guided PIV placed, bp variable. Pt not leaving bp cuff on arm and keeps asking it to be placed on wrist and giving unreliable bp, will try to get on bicep and get a reliable bp prior to starting levo.  [SG]  1604 BP on upper arm remains hypotensive, will start peripheral levo [SG]  1604 Mental status greatly improved following tylenol [SG]    Clinical Course User Index [SG] Sloan Leiter, DO    Mental status greatly improved  Her bp remains soft after 2L LR, will give 3rd L  Start peripheral levophed  Workup concerning for pna, septic shock 2/2 pneumonia; AKI  Hypoxia, requiring 2-3L [place], no home o2 requirement  Given undifferentiated abx initially, can likely scale back to pna specific  Admitted PCCM                 Additional history obtained: -Additional history obtained from family -External records from outside source obtained and reviewed including: Chart review including  previous notes, labs, imaging, consultation notes including  Prior ed Prior echo Prior labs/imaging    Lab Tests: -I ordered, reviewed, and interpreted labs.   The pertinent results include:   Labs Reviewed  LACTIC ACID, PLASMA - Abnormal; Notable for the following components:      Result Value   Lactic Acid, Venous 5.1 (*)    All other components within normal limits  LACTIC ACID, PLASMA - Abnormal; Notable for the following components:   Lactic Acid, Venous 5.8 (*)    All other components within normal limits  COMPREHENSIVE METABOLIC PANEL - Abnormal; Notable for the following components:   Sodium 130 (*)    Potassium 3.3 (*)    Chloride 94 (*)    Glucose, Bld 225 (*)    Creatinine, Ser 1.41 (*)    Total Protein 5.2 (*)    Albumin 2.4 (*)    GFR, Estimated 36 (*)    All other components within normal limits  CBC WITH DIFFERENTIAL/PLATELET - Abnormal; Notable for the following components:   WBC 13.0 (*)    Hemoglobin 11.9 (*)    Neutro Abs 10.3 (*)    Monocytes Absolute 1.2 (*)  Abs Immature Granulocytes 0.10 (*)    All other components within normal limits  PROTIME-INR - Abnormal; Notable for the following components:   Prothrombin Time 18.1 (*)    INR 1.5 (*)    All other components within normal limits  URINALYSIS, W/ REFLEX TO CULTURE (INFECTION SUSPECTED) - Abnormal; Notable for the following components:   Color, Urine AMBER (*)    APPearance HAZY (*)    Glucose, UA 50 (*)    Protein, ur 30 (*)    Bacteria, UA MANY (*)    All other components within normal limits  BLOOD GAS, VENOUS - Abnormal; Notable for the following components:   pH, Ven 7.44 (*)    pCO2, Ven 36 (*)    pO2, Ven <31 (*)    All other components within normal limits  BRAIN NATRIURETIC PEPTIDE - Abnormal; Notable for the following components:   B Natriuretic Peptide 538.0 (*)    All other components within normal limits  CBC - Abnormal; Notable for the following components:   WBC 14.3 (*)     Hemoglobin 11.2 (*)    HCT 34.9 (*)    All other components within normal limits  GLUCOSE, CAPILLARY - Abnormal; Notable for the following components:   Glucose-Capillary 174 (*)    All other components within normal limits  BASIC METABOLIC PANEL - Abnormal; Notable for the following components:   Sodium 132 (*)    CO2 21 (*)    Glucose, Bld 201 (*)    Creatinine, Ser 1.42 (*)    Calcium 8.8 (*)    GFR, Estimated 35 (*)    All other components within normal limits  LACTIC ACID, PLASMA - Abnormal; Notable for the following components:   Lactic Acid, Venous 5.3 (*)    All other components within normal limits  HEMOGLOBIN A1C - Abnormal; Notable for the following components:   Hgb A1c MFr Bld 7.7 (*)    All other components within normal limits  GLUCOSE, CAPILLARY - Abnormal; Notable for the following components:   Glucose-Capillary 196 (*)    All other components within normal limits  TROPONIN I (HIGH SENSITIVITY) - Abnormal; Notable for the following components:   Troponin I (High Sensitivity) 1,566 (*)    All other components within normal limits  RESP PANEL BY RT-PCR (RSV, FLU A&B, COVID)  RVPGX2  CULTURE, BLOOD (ROUTINE X 2)  RESPIRATORY PANEL BY PCR  MRSA NEXT GEN BY PCR, NASAL  MRSA NEXT GEN BY PCR, NASAL  APTT  LEGIONELLA PNEUMOPHILA SEROGP 1 UR AG  STREP PNEUMONIAE URINARY ANTIGEN  PROCALCITONIN  CBC  BASIC METABOLIC PANEL  MAGNESIUM  PHOSPHORUS  LACTIC ACID, PLASMA  URINALYSIS, W/ REFLEX TO CULTURE (INFECTION SUSPECTED)  LACTIC ACID, PLASMA  TROPONIN I (HIGH SENSITIVITY)    Notable for septi shock aki  EKG   EKG Interpretation Date/Time:    Ventricular Rate:    PR Interval:    QRS Duration:    QT Interval:    QTC Calculation:   R Axis:      Text Interpretation:           Imaging Studies ordered: I ordered imaging studies including CXR I independently visualized the following imaging with scope of interpretation limited to determining acute  life threatening conditions related to emergency care; findings noted above, significant for PNA I independently visualized and interpreted imaging. I agree with the radiologist interpretation   Medicines ordered and prescription drug management: Meds ordered this encounter  Medications  lactated ringers infusion   lactated ringers bolus 1,000 mL    Order Specific Question:   Reason 30 mL/kg dose is not being ordered    Answer:   First Lactic Acid Pending   ceFEPIme (MAXIPIME) 2 g in sodium chloride 0.9 % 100 mL IVPB    Order Specific Question:   Antibiotic Indication:    Answer:   Other Indication (list below)    Order Specific Question:   Other Indication:    Answer:   Unknown source   metroNIDAZOLE (FLAGYL) IVPB 500 mg    Order Specific Question:   Antibiotic Indication:    Answer:   Other Indication (list below)    Order Specific Question:   Other Indication:    Answer:   Unknown source   vancomycin (VANCOCIN) IVPB 1000 mg/200 mL premix    Order Specific Question:   Indication:    Answer:   Other Indication (list below)    Order Specific Question:   Other Indication:    Answer:   Unknown source   acetaminophen (TYLENOL) suppository 650 mg   lactated ringers bolus 1,000 mL   ceFEPIme (MAXIPIME) 2 g in sodium chloride 0.9 % 100 mL IVPB    Order Specific Question:   Antibiotic Indication:    Answer:   Sepsis   vancomycin variable dose per unstable renal function (pharmacist dosing)   0.9 %  sodium chloride infusion   norepinephrine (LEVOPHED) 4mg  in (0.016 mg/mL) premix infusion    Order Specific Question:   IV Access    Answer:   Peripheral   DISCONTD: ziprasidone (GEODON) injection 10 mg   DISCONTD: sterile water (preservative free) injection    White, Meagan R: cabinet override   lactated ringers bolus 1,000 mL   OLANZapine zydis (ZYPREXA) disintegrating tablet 5 mg   docusate sodium (COLACE) capsule 100 mg   polyethylene glycol (MIRALAX / GLYCOLAX) packet 17 g    enoxaparin (LOVENOX) injection 30 mg   potassium chloride 10 mEq in 100 mL IVPB   insulin aspart (novoLOG) injection 0-15 Units    Order Specific Question:   Correction coverage:    Answer:   Moderate (average weight, post-op)    Order Specific Question:   CBG < 70:    Answer:   Implement Hypoglycemia Standing Orders and refer to Hypoglycemia Standing Orders sidebar report    Order Specific Question:   CBG 70 - 120:    Answer:   0 units    Order Specific Question:   CBG 121 - 150:    Answer:   2 units    Order Specific Question:   CBG 151 - 200:    Answer:   3 units    Order Specific Question:   CBG 201 - 250:    Answer:   5 units    Order Specific Question:   CBG 251 - 300:    Answer:   8 units    Order Specific Question:   CBG 301 - 350:    Answer:   11 units    Order Specific Question:   CBG 351 - 400:    Answer:   15 units    Order Specific Question:   CBG > 400    Answer:   call MD and obtain STAT lab verification   Chlorhexidine Gluconate Cloth 2 % PADS 6 each   lidocaine (LIDODERM) 5 % 1 patch    Remove & Discard patch within 12 hours or as directed by  MD.  Apply to hip     acetaminophen (OFIRMEV) IV 1,000 mg    Order Specific Question:   Is the patient UNABLE to take oral / enteral medications?    Answer:   Yes    -I have reviewed the patients home medicines and have made adjustments as needed   Consultations Obtained: I requested consultation with the PCCM,  and discussed lab and imaging findings as well as pertinent plan - they recommend: admit   Cardiac Monitoring: The patient was maintained on a cardiac monitor.  I personally viewed and interpreted the cardiac monitored which showed an underlying rhythm of: SR  Social Determinants of Health:  Diagnosis or treatment significantly limited by social determinants of health: non smoker   Reevaluation: After the interventions noted above, I reevaluated the patient and found that they have improved  Co  morbidities that complicate the patient evaluation  Past Medical History:  Diagnosis Date   CIDP (chronic inflammatory demyelinating polyneuropathy) (HCC)    Essential hypertension, benign    pt denies   History of DVT (deep vein thrombosis)    Reportedly left leg, details not clear   Mixed hyperlipidemia    Type 2 diabetes mellitus (HCC)       Dispostion: Disposition decision including need for hospitalization was considered, and patient admitted to the hospital.    Final Clinical Impression(s) / ED Diagnoses Final diagnoses:  Septic shock (HCC)  Pneumonia due to infectious organism, unspecified laterality, unspecified part of lung  Encephalopathy  AKI (acute kidney injury) (HCC)        Sloan Leiter, DO 08/15/23 2031

## 2023-08-15 NOTE — ED Notes (Signed)
ED Provider at bedside. 

## 2023-08-15 NOTE — Sepsis Progress Note (Signed)
Notified bedside nurse of need to draw repeat lactic acid per sepsis protocol since the 2nd lactic acid was higher than the first..

## 2023-08-15 NOTE — ED Notes (Signed)
Phlebotomist notified in person for need for labs ASAP on this sepsis pt. RN unable to obtain labs after 2 IV sticks.

## 2023-08-15 NOTE — Consult Note (Signed)
CODE SEPSIS - PHARMACY COMMUNICATION  **Broad-spectrum antimicrobials should be administered within one hour of sepsis diagnosis**  Time Code Sepsis call or page was received: 1039  Antibiotics ordered: Cefepime, Vancomycin, Metronidazole  Time of first antibiotic administration: 1139  Additional action taken by pharmacy: N/A  If necessary, name of provider/nurse contacted: N/A    Will M. Dareen Piano, PharmD Clinical Pharmacist 08/15/2023 11:00 AM

## 2023-08-15 NOTE — ED Notes (Signed)
1st set of blood cultures and other labs finally obtained by lab at this time.

## 2023-08-15 NOTE — ED Triage Notes (Signed)
Pt brought by EMS. Pt was seen here x5 days ago for UTI. Family states pt has declined since. Poor PO intake. Pt rectal temp in triage is 105.4. MD aware.

## 2023-08-15 NOTE — Progress Notes (Signed)
Peripherally Inserted Central Catheter Placement  The IV Nurse has discussed with the patient and/or persons authorized to consent for the patient, the purpose of this procedure and the potential benefits and risks involved with this procedure.  The benefits include less needle sticks, lab draws from the catheter, and the patient may be discharged home with the catheter. Risks include, but not limited to, infection, bleeding, blood clot (thrombus formation), and puncture of an artery; nerve damage and irregular heartbeat and possibility to perform a PICC exchange if needed/ordered by physician.  Alternatives to this procedure were also discussed.  Bard Power PICC patient education guide, fact sheet on infection prevention and patient information card has been provided to patient /or left at bedside.    PICC Placement Documentation  PICC Triple Lumen 08/15/23 Right Basilic 35 cm 0 cm (Active)  Indication for Insertion or Continuance of Line Vasoactive infusions 08/15/23 2330  Exposed Catheter (cm) 0 cm 08/15/23 2330  Site Assessment Clean, Dry, Intact 08/15/23 2330  Lumen #1 Status Flushed;Saline locked;Blood return noted 08/15/23 2330  Lumen #2 Status Flushed;Saline locked;Blood return noted 08/15/23 2330  Lumen #3 Status Flushed;Saline locked;Blood return noted 08/15/23 2330  Dressing Type Transparent;Securing device 08/15/23 2330  Dressing Status Antimicrobial disc in place 08/15/23 2330  Line Care Connections checked and tightened 08/15/23 2330  Line Adjustment (NICU/IV Team Only) No 08/15/23 2330  Dressing Intervention New dressing 08/15/23 2330  Dressing Change Due 08/22/23 08/15/23 2330       Vernona Rieger  Nadia Torr 08/15/2023, 11:31 PM

## 2023-08-16 ENCOUNTER — Inpatient Hospital Stay (HOSPITAL_COMMUNITY): Payer: Medicare Other

## 2023-08-16 DIAGNOSIS — J189 Pneumonia, unspecified organism: Secondary | ICD-10-CM

## 2023-08-16 DIAGNOSIS — R0603 Acute respiratory distress: Secondary | ICD-10-CM

## 2023-08-16 DIAGNOSIS — A419 Sepsis, unspecified organism: Secondary | ICD-10-CM | POA: Diagnosis not present

## 2023-08-16 DIAGNOSIS — R7989 Other specified abnormal findings of blood chemistry: Secondary | ICD-10-CM

## 2023-08-16 DIAGNOSIS — J9601 Acute respiratory failure with hypoxia: Secondary | ICD-10-CM | POA: Diagnosis not present

## 2023-08-16 DIAGNOSIS — R6521 Severe sepsis with septic shock: Secondary | ICD-10-CM | POA: Diagnosis not present

## 2023-08-16 LAB — ECHOCARDIOGRAM COMPLETE
AR max vel: 2.6 cm2
AV Peak grad: 9.7 mmHg
Ao pk vel: 1.56 m/s
Area-P 1/2: 4.89 cm2
Height: 66 in
MV M vel: 4.5 m/s
MV Peak grad: 81 mmHg
S' Lateral: 3.43 cm
Weight: 2313.95 oz

## 2023-08-16 LAB — CBC
HCT: 29.8 % — ABNORMAL LOW (ref 36.0–46.0)
Hemoglobin: 9.9 g/dL — ABNORMAL LOW (ref 12.0–15.0)
MCH: 29.3 pg (ref 26.0–34.0)
MCHC: 33.2 g/dL (ref 30.0–36.0)
MCV: 88.2 fL (ref 80.0–100.0)
Platelets: 214 10*3/uL (ref 150–400)
RBC: 3.38 MIL/uL — ABNORMAL LOW (ref 3.87–5.11)
RDW: 13.7 % (ref 11.5–15.5)
WBC: 12.8 10*3/uL — ABNORMAL HIGH (ref 4.0–10.5)
nRBC: 0 % (ref 0.0–0.2)

## 2023-08-16 LAB — APTT
aPTT: 39 seconds — ABNORMAL HIGH (ref 24–36)
aPTT: 48 seconds — ABNORMAL HIGH (ref 24–36)

## 2023-08-16 LAB — BASIC METABOLIC PANEL
Anion gap: 13 (ref 5–15)
BUN: 16 mg/dL (ref 8–23)
CO2: 25 mmol/L (ref 22–32)
Calcium: 8.8 mg/dL — ABNORMAL LOW (ref 8.9–10.3)
Chloride: 96 mmol/L — ABNORMAL LOW (ref 98–111)
Creatinine, Ser: 1.21 mg/dL — ABNORMAL HIGH (ref 0.44–1.00)
GFR, Estimated: 43 mL/min — ABNORMAL LOW (ref >60)
Glucose, Bld: 141 mg/dL — ABNORMAL HIGH (ref 70–99)
Potassium: 4.3 mmol/L (ref 3.5–5.1)
Sodium: 134 mmol/L — ABNORMAL LOW (ref 135–145)

## 2023-08-16 LAB — GLUCOSE, CAPILLARY
Glucose-Capillary: 113 mg/dL — ABNORMAL HIGH (ref 70–99)
Glucose-Capillary: 121 mg/dL — ABNORMAL HIGH (ref 70–99)
Glucose-Capillary: 122 mg/dL — ABNORMAL HIGH (ref 70–99)
Glucose-Capillary: 137 mg/dL — ABNORMAL HIGH (ref 70–99)
Glucose-Capillary: 152 mg/dL — ABNORMAL HIGH (ref 70–99)
Glucose-Capillary: 250 mg/dL — ABNORMAL HIGH (ref 70–99)
Glucose-Capillary: 271 mg/dL — ABNORMAL HIGH (ref 70–99)

## 2023-08-16 LAB — TROPONIN I (HIGH SENSITIVITY): Troponin I (High Sensitivity): 1547 ng/L (ref <18)

## 2023-08-16 LAB — PHOSPHORUS: Phosphorus: 1.3 mg/dL — ABNORMAL LOW (ref 2.5–4.6)

## 2023-08-16 LAB — LACTIC ACID, PLASMA
Lactic Acid, Venous: 2.4 mmol/L (ref 0.5–1.9)
Lactic Acid, Venous: 1.5 mmol/L (ref 0.5–1.9)

## 2023-08-16 LAB — PROCALCITONIN: Procalcitonin: 66.67 ng/mL

## 2023-08-16 LAB — HEPARIN LEVEL (UNFRACTIONATED)
Heparin Unfractionated: <0.1 [IU]/mL — ABNORMAL LOW (ref 0.30–0.70)
Heparin Unfractionated: 0.12 [IU]/mL — ABNORMAL LOW (ref 0.30–0.70)

## 2023-08-16 LAB — MAGNESIUM: Magnesium: 1 mg/dL — ABNORMAL LOW (ref 1.7–2.4)

## 2023-08-16 MED ORDER — POTASSIUM CHLORIDE 10 MEQ/100ML IV SOLN
INTRAVENOUS | Status: AC
  Administered 2023-08-16: 10 meq via INTRAVENOUS
  Filled 2023-08-16: qty 100

## 2023-08-16 MED ORDER — SODIUM PHOSPHATES 45 MMOLE/15ML IV SOLN
30.0000 mmol | Freq: Once | INTRAVENOUS | Status: AC
  Administered 2023-08-16: 30 mmol via INTRAVENOUS
  Filled 2023-08-16: qty 10

## 2023-08-16 MED ORDER — GABAPENTIN 250 MG/5ML PO SOLN
100.0000 mg | Freq: Two times a day (BID) | ORAL | Status: DC
  Filled 2023-08-16: qty 2

## 2023-08-16 MED ORDER — HYDROCORTISONE SOD SUC (PF) 100 MG IJ SOLR
100.0000 mg | Freq: Two times a day (BID) | INTRAMUSCULAR | Status: DC
  Administered 2023-08-16 – 2023-08-17 (×3): 100 mg via INTRAVENOUS
  Filled 2023-08-16 (×3): qty 2

## 2023-08-16 MED ORDER — GABAPENTIN 100 MG PO CAPS: 100.0000 mg | ORAL_CAPSULE | Freq: Two times a day (BID) | ORAL | Status: DC

## 2023-08-16 MED ORDER — HEPARIN BOLUS VIA INFUSION
3500.0000 [IU] | Freq: Once | INTRAVENOUS | Status: AC
  Administered 2023-08-16: 3500 [IU] via INTRAVENOUS
  Filled 2023-08-16: qty 3500

## 2023-08-16 MED ORDER — LACTATED RINGERS IV BOLUS
500.0000 mL | Freq: Once | INTRAVENOUS | Status: AC
  Administered 2023-08-16: 500 mL via INTRAVENOUS

## 2023-08-16 MED ORDER — HEPARIN (PORCINE) 25000 UT/250ML-% IV SOLN
750.0000 [IU]/h | INTRAVENOUS | Status: DC
  Administered 2023-08-16: 750 [IU]/h via INTRAVENOUS
  Filled 2023-08-16: qty 250

## 2023-08-16 MED ORDER — ENSURE ENLIVE PO LIQD: 237.0000 mL | Freq: Two times a day (BID) | ORAL | Status: DC

## 2023-08-16 MED ORDER — MAGNESIUM SULFATE 50 % IJ SOLN
6.0000 g | Freq: Once | INTRAVENOUS | Status: AC
  Administered 2023-08-16: 6 g via INTRAVENOUS
  Filled 2023-08-16: qty 12

## 2023-08-16 MED ORDER — ASPIRIN 81 MG PO CHEW
81.0000 mg | CHEWABLE_TABLET | Freq: Every day | ORAL | Status: DC
  Administered 2023-08-16: 81 mg via ORAL
  Filled 2023-08-16: qty 1

## 2023-08-16 MED ORDER — FENTANYL CITRATE PF 50 MCG/ML IJ SOSY
12.5000 ug | PREFILLED_SYRINGE | INTRAMUSCULAR | Status: DC | PRN
  Administered 2023-08-16 (×2): 50 ug via INTRAVENOUS
  Administered 2023-08-17: 25 ug via INTRAVENOUS
  Filled 2023-08-16 (×3): qty 1

## 2023-08-16 MED ORDER — GABAPENTIN 100 MG PO CAPS
100.0000 mg | ORAL_CAPSULE | Freq: Two times a day (BID) | ORAL | Status: DC
  Administered 2023-08-16 (×2): 100 mg via ORAL
  Filled 2023-08-16 (×2): qty 1

## 2023-08-16 NOTE — Progress Notes (Signed)
Echocardiogram 2D Echocardiogram has been performed.  Linda Hess 08/16/2023, 12:23 PM

## 2023-08-16 NOTE — Progress Notes (Signed)
Cortrak Tube Team Note:  Consult received to place a Cortrak feeding tube. Cortrak RD attempted placement at the bedside. Pt given pain medication prior to placement. RD encountered difficulty with advancing Cortrak tube distal to GE junction and into stomach. Cortrak tube met resistance at GE junction and started becoming coiled in pt's mouth. Pt did not tolerate placement well due to pain and agitation.  Prior to re-attempting placement, RN reached out to MD and pt's son. Request was made to hold off on any additional attempts at Cortrak placement at this time. Will d/c Cortrak consult.   Mertie Clause, MS, RD, LDN Registered Dietitian II Please see AMiON for contact information.

## 2023-08-16 NOTE — IPAL (Signed)
  Interdisciplinary Goals of Care Family Meeting   Date carried out: 08/16/2023  Location of Linda meeting: Bedside  Member's involved: Physician, Bedside Registered Nurse, and Family Member or next of kin  Durable Power of Attorney or acting medical decision maker: son    Discussion: We discussed goals of care for Linda Hess .  Her son relates that she has been in significant pain recently and at baseline has a poor QOL. He understands my concern that she is unlikely to benefit from resuscitation and be able to make a meaningful recovery if she had a cardiac arrest. He agrees that changing code status is appropriate. We discussed Linda option to purely focus on her comfort. He is concerned that she has suffered for many years. He thinks comfort care is likely most appropriate but wants to discuss with his brothers before switching Linda direction of her care entirely.   Code status:   Code Status: DNR   Disposition: Continue current acute care; son discussing with brothers if switching to comfort is appropriate  Time spent for Linda meeting: 10 min .    Steffanie Dunn, DO  08/16/2023, 9:34 AM

## 2023-08-16 NOTE — IPAL (Signed)
  Interdisciplinary Goals of Care Family Meeting   Date carried out: 08/16/2023  Location of the meeting: Unit  Member's involved: Physician, Bedside Registered Nurse, and Family Member or next of kin  Durable Power of Attorney or acting medical decision maker: sons- have met with all 3 today    Discussion: We discussed goals of care for [employer] .  I met with Ms. Tener's 3 sons today. My concern is her deteriorating clinical condition and her limited reserve to be able to recover. All 3 of her sons agree she would not want resuscitation if she had a cardiac arrest. They are worried about her suffering. They have had multiple family members come visit her today and she has perked up some. Despite this, they agree that her time may be limited and she is likely to continue to decline. They want to aggressively treat her this admission, but would not want to continue to re-hospitalize her after this. They know she does not want to go to a SNF, but they do not think they can continue to meet her needs at home. We will consult palliative care to help come up with a plan to address her anxiety, pain, and other symptoms.   Code status:   Code Status: DNR   Disposition: Continue current acute care; planning for discharge with hospice to SNF  Time spent for the meeting:  30 min.    Steffanie Dunn, DO  08/16/2023, 7:05 PM

## 2023-08-16 NOTE — Progress Notes (Signed)
Brief Nutrition Note  Consult received for initiation of enteral feeds and cortrak placement. Tube placement attempted this AM but was not tolerated by patient. Family requested no further attempts at placement be made. Discussed with MD, states that family is visiting with pt and discussing GOC. Seems to be leaning more towards comfort focused care versus aggressive interventions. Will monitor GOC discussions and make recommendations as appropriate.  Greig Castilla, RD, LDN Clinical Dietitian RD pager # available in AMION  After hours/weekend pager # available in [facility]

## 2023-08-16 NOTE — Progress Notes (Signed)
NAME:  Linda Hess, MRN:  [id number], DOB:  [birth year], LOS: 1 ADMISSION DATE:  08/15/2023, CONSULTATION DATE:  08/15/2023 REFERRING MD:  Dr. Wallace Cullens - EDP, CHIEF COMPLAINT:  Acute respiratory failure    History of Present Illness:  Linda Hess is a 89 y.o. female with a past medical history for chronic inflammatory demyelinating polyneuropathy, HTN, HLD, prior DVT, and type 2 diabetes who presented to the ED at [facility] 8/18 for altered mental status and fever.  Of note patient was recently seen in ED for UTI.  On arrival to ED patient was seen with temperature of 105.4, tachypnea, tachycardia, and mild hypotension.  Lab work on admission significant for NA 130, K3.3, glucose 225, creatinine 1.41, albumin 2.4, BNP 538, lactic acid 5.1, WBC 13.0.  Chest x-ray significant for likely right middle and upper lobe pneumonia.  UA on admission with many bacteria but no nitrates or leukocytes.  Given hypotension patient was started on vasopressors, PCCM consulted for further management admission to [facility].  Upon arrival to [facility], ICU bed to obtain patient was oriented and found to be frail.  Nasal cannula oxygen and peripheral Levophed.  Pertinent  Medical History  Chronic inflammatory demyelinating polyneuropathy, HTN, HLD, prior DVT, and type 2 diabetes  Significant Hospital Events: Including procedures, antibiotic start and stop dates in addition to other pertinent events   8/18 presented with altered mental status and fever with Tmax 105.6 admitted for septic shock secondary to likely pneumonia and UTI  Interim History / Subjective:  This morning endorses pain.  Objective   Blood pressure (!) 98/47, pulse 61, temperature (!) 96.8 F (36 C), temperature source Axillary, resp. rate 19, height 5\' 6"  (1.676 m), weight 65.6 kg, SpO2 (!) 86%.        Intake/Output Summary (Last 24 hours) at 08/16/2023 0852 Last data filed at 08/16/2023 0700 Gross per 24 hour  Intake 6032.14 ml   Output 600 ml  Net 5432.14 ml   Filed Weights   08/15/23 1029 08/16/23 0358  Weight: 64 kg 65.6 kg    Examination: General Appearance:  chronically ill appearing woman lying in bed moaning.  HEENT: [place]/AT Lungs: breathing comfortably on RA, CTAB anteriorly. Moaning when awake,  Heart:  S1S2, tachycardic, irreg rhythm Abdomen: soft, NT Extremities:  no cyanosis or edema Neurologic:  moaning, answering some questions by nodding but not talking, globally weak   8/18 blood culture- 1 site: NGTD Covid, flu, RSV negative RVP negative  Na+ 134 BUN 16 Cr 1.21 BNP 1547 LA 1.5 WBC 12.8 H/H 9.9/29.8  A1c 7.7  Trop 1566> 1547 EKG : sinus rhythm with PACs, left axis, prolonged Qtc.  TWI in V3-V6-- less steep compared to prior EKG.   Echo: LVEF 60-65%  Resolved Hospital Problem list     Assessment & Plan:  Septic shock secondary to pneumonia and likely pneumonia - right upper and middle lobe pneumonia.  -follow cultures -con't empiric antibiotics -hydrated, additional 500cc IVF today -norepi to maintain MAP >65  Acute respiratory failure due to pneumonia -supplemental O2 to maintain SpO2 >90%   Acute Kidney Injury  -strict I/O -renally dose meds, avoid nephrotoxic meds -monitor -maintain adequate perfusion  Elevated troponin, EKG concerning for lateral ischemia. No RWMA on echo.  -aspirin -had been started on heparin, with reassuring echo can stop.   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 Rx with D5 in fluid DVT prophylaxis: DOAC GI prophylaxis: PPI Lines: N/A Foley:  N/A Code Status:  full code Last date of multidisciplinary goals of care discussion: see 8/19 ipal note  08/15/23:  [name]: [phone]    LABS    PULMONARY Recent Labs  Lab 08/15/23 1306  HCO3 24.6  O2SAT 59.5    CBC Recent Labs  Lab 08/15/23 1148 08/15/23 1921 08/16/23 0215  HGB 11.9* 11.2* 9.9*  HCT 36.6 34.9* 29.8*  WBC 13.0* 14.3* 12.8*  PLT 249 201 214    COAGULATION Recent Labs  Lab 08/15/23 1148  INR 1.5*    CARDIAC  No results for input(s): "TROPONINI" in the last 168 hours. No results for input(s): "PROBNP" in the last 168 hours.   CHEMISTRY Recent Labs  Lab 08/10/23 1107 08/15/23 1148 08/15/23 1921 08/16/23 0149  NA 132* 130* 132* 134*  K 3.5 3.3* 3.7 4.3  CL 94* 94* 98 96*  CO2 24 22 21* 25  GLUCOSE 124* 225* 201* 141*  BUN 17 18 17 16   CREATININE 1.15* 1.41* 1.42* 1.21*  CALCIUM 9.4 9.1 8.8* 8.8*  MG 1.7  --   --  1.0*  PHOS  --   --   --  1.3*   Estimated Creatinine Clearance: 29.5 mL/min (A) (by C-G formula based on SCr of 1.21 mg/dL (H)).   LIVER Recent Labs  Lab 08/10/23 1107 08/15/23 1148  AST 24 39  ALT 14 17  ALKPHOS 41 39  BILITOT 2.0* 1.1  PROT 5.9* 5.2*  ALBUMIN 3.0* 2.4*  INR  --  1.5*      This patient is critically ill with multiple organ system failure which requires frequent high complexity decision making, assessment, support, evaluation, and titration of therapies. This was completed through the  application of advanced monitoring technologies and extensive interpretation of multiple databases. During this encounter critical care time was devoted to patient care services described in this note for 60 minutes.   Steffanie Dunn, DO 08/16/23 7:04 PM [facility]  For contact information, see Amion. If no response to pager, please call PCCM consult pager. After hours, 7PM- 7AM, please call Elink.

## 2023-08-16 NOTE — Progress Notes (Signed)
ANTICOAGULATION CONSULT NOTE - Initial Consult  Pharmacy Consult for heparin gtt Indication: chest pain/ACS  Allergies  Allergen Reactions   Augmentin [Amoxicillin-Pot Clavulanate] Hives   Other Hives and Itching    Red meat - hives, itching   Penicillins Hives and Rash    Patient Measurements: Height: 5\' 6"  (167.6 cm) Weight: 65.6 kg (144 lb 10 oz) IBW/kg (Calculated) : 59.3 Heparin Dosing Weight: 64 kg  Vital Signs: Temp: 96.8 F (36 C) (08/19 0818) Temp Source: Axillary (08/19 0818) BP: 111/58 (08/19 0930) Pulse Rate: 64 (08/19 0945)  Labs: Recent Labs    08/15/23 1148 08/15/23 1921 08/16/23 0149 08/16/23 0215  HGB 11.9* 11.2*  --  9.9*  HCT 36.6 34.9*  --  29.8*  PLT 249 201  --  214  APTT 35  --   --   --   LABPROT 18.1*  --   --   --   INR 1.5*  --   --   --   CREATININE 1.41* 1.42* 1.21*  --   TROPONINIHS  --  1,566*  --  1,547*    Estimated Creatinine Clearance: 29.5 mL/min (A) (by C-G formula based on SCr of 1.21 mg/dL (H)).   Medical History: Past Medical History:  Diagnosis Date   CIDP (chronic inflammatory demyelinating polyneuropathy) (HCC)    Essential hypertension, benign    pt denies   History of DVT (deep vein thrombosis)    Reportedly left leg, details not clear   Mixed hyperlipidemia    Type 2 diabetes mellitus (HCC)     Assessment: 89yoF admitted for septic shock on rivaroxaban at home for history of DVT. Found with elevated troponins, consult placed to initiate heparin gtt while determining plans for intervention and clarifying goals of care.   Goal of Therapy:  Heparin level 0.3-0.7 units/ml Monitor platelets by anticoagulation protocol: Yes   Plan:  Give 3500 units bolus x 1 Start heparin infusion at 750 units/hr Check anti-Xa level in 8 hours and daily while on heparin Continue to monitor H&H and platelets  Rutherford Nail, PharmD PGY2 Critical Care Pharmacy Resident 08/16/2023,9:51 AM

## 2023-08-17 DIAGNOSIS — J189 Pneumonia, unspecified organism: Secondary | ICD-10-CM | POA: Diagnosis not present

## 2023-08-17 DIAGNOSIS — R6521 Severe sepsis with septic shock: Secondary | ICD-10-CM | POA: Diagnosis not present

## 2023-08-17 DIAGNOSIS — N179 Acute kidney failure, unspecified: Secondary | ICD-10-CM

## 2023-08-17 DIAGNOSIS — A419 Sepsis, unspecified organism: Secondary | ICD-10-CM | POA: Diagnosis not present

## 2023-08-17 LAB — BASIC METABOLIC PANEL
Anion gap: 10 (ref 5–15)
BUN: 21 mg/dL (ref 8–23)
CO2: 24 mmol/L (ref 22–32)
Calcium: 8.6 mg/dL — ABNORMAL LOW (ref 8.9–10.3)
Chloride: 98 mmol/L (ref 98–111)
Creatinine, Ser: 0.89 mg/dL (ref 0.44–1.00)
GFR, Estimated: >60 mL/min (ref >60)
Glucose, Bld: 272 mg/dL — ABNORMAL HIGH (ref 70–99)
Potassium: 3.5 mmol/L (ref 3.5–5.1)
Sodium: 132 mmol/L — ABNORMAL LOW (ref 135–145)

## 2023-08-17 LAB — MAGNESIUM: Magnesium: 2.2 mg/dL (ref 1.7–2.4)

## 2023-08-17 LAB — GLUCOSE, CAPILLARY
Glucose-Capillary: 245 mg/dL — ABNORMAL HIGH (ref 70–99)
Glucose-Capillary: 221 mg/dL — ABNORMAL HIGH (ref 70–99)
Glucose-Capillary: 165 mg/dL — ABNORMAL HIGH (ref 70–99)

## 2023-08-17 LAB — PHOSPHORUS: Phosphorus: 2.4 mg/dL — ABNORMAL LOW (ref 2.5–4.6)

## 2023-08-17 MED ORDER — HALOPERIDOL LACTATE 5 MG/ML IJ SOLN: 2.5000 mg | INTRAMUSCULAR | Status: DC | PRN

## 2023-08-17 MED ORDER — PROCHLORPERAZINE 25 MG RE SUPP: 25.0000 mg | Freq: Two times a day (BID) | RECTAL | Status: DC | PRN

## 2023-08-17 MED ORDER — GLYCOPYRROLATE 0.2 MG/ML IJ SOLN: 0.2000 mg | INTRAMUSCULAR | Status: DC | PRN

## 2023-08-17 MED ORDER — MIDAZOLAM-SODIUM CHLORIDE 100-0.9 MG/100ML-% IV SOLN
0.0000 mg/h | INTRAVENOUS | Status: DC
  Administered 2023-08-17: 1 mg/h via INTRAVENOUS
  Filled 2023-08-17: qty 100

## 2023-08-17 MED ORDER — MIDAZOLAM HCL 2 MG/2ML IJ SOLN: 1.0000 mg | INTRAMUSCULAR | Status: DC | PRN

## 2023-08-17 MED ORDER — FENTANYL CITRATE PF 50 MCG/ML IJ SOSY
12.5000 ug | PREFILLED_SYRINGE | INTRAMUSCULAR | Status: DC | PRN
  Administered 2023-08-17 (×4): 50 ug via INTRAVENOUS
  Filled 2023-08-17 (×4): qty 1

## 2023-08-17 MED ORDER — MIDAZOLAM BOLUS VIA INFUSION (WITHDRAWAL LIFE SUSTAINING TX): 2.0000 mg | INTRAVENOUS | Status: DC | PRN

## 2023-08-17 MED ORDER — DIPHENHYDRAMINE HCL 50 MG/ML IJ SOLN: 25.0000 mg | INTRAMUSCULAR | Status: DC | PRN

## 2023-08-17 MED ORDER — MORPHINE BOLUS VIA INFUSION: 5.0000 mg | INTRAVENOUS | Status: DC | PRN

## 2023-08-17 MED ORDER — ACETAMINOPHEN 650 MG RE SUPP: 650.0000 mg | Freq: Four times a day (QID) | RECTAL | Status: DC | PRN

## 2023-08-17 MED ORDER — PROCHLORPERAZINE MALEATE 10 MG PO TABS: 10.0000 mg | ORAL_TABLET | Freq: Four times a day (QID) | ORAL | Status: DC | PRN

## 2023-08-17 MED ORDER — FENTANYL CITRATE PF 50 MCG/ML IJ SOSY: 100.0000 ug | PREFILLED_SYRINGE | Freq: Once | INTRAMUSCULAR | Status: DC

## 2023-08-17 MED ORDER — GLYCOPYRROLATE 1 MG PO TABS: 1.0000 mg | ORAL_TABLET | ORAL | Status: DC | PRN

## 2023-08-17 MED ORDER — SODIUM CHLORIDE 0.9 % IV SOLN
500.0000 mg | INTRAVENOUS | Status: DC
  Administered 2023-08-17: 500 mg via INTRAVENOUS
  Filled 2023-08-17: qty 5

## 2023-08-17 MED ORDER — MORPHINE 100MG IN NS 100ML (1MG/ML) PREMIX INFUSION
0.0000 mg/h | INTRAVENOUS | Status: DC
  Administered 2023-08-17: 10 mg/h via INTRAVENOUS
  Administered 2023-08-17: 5 mg/h via INTRAVENOUS
  Administered 2023-08-18 (×2): 10 mg/h via INTRAVENOUS
  Filled 2023-08-17 (×4): qty 100

## 2023-08-17 MED ORDER — POLYVINYL ALCOHOL 1.4 % OP SOLN: 1.0000 [drp] | Freq: Four times a day (QID) | OPHTHALMIC | Status: DC | PRN

## 2023-08-17 MED ORDER — ACETAMINOPHEN 325 MG PO TABS: 650.0000 mg | ORAL_TABLET | Freq: Four times a day (QID) | ORAL | Status: DC | PRN

## 2023-08-17 MED ORDER — PROCHLORPERAZINE EDISYLATE 10 MG/2ML IJ SOLN: 10.0000 mg | Freq: Four times a day (QID) | INTRAMUSCULAR | Status: DC | PRN

## 2023-08-17 NOTE — Progress Notes (Signed)
Nutrition Brief Note  Chart reviewed. Pt now transitioning to comfort care.  No further nutrition interventions planned at this time.  Please re-consult as needed.   Rachel Hunter, RD, LDN Clinical Dietitian RD pager # available in AMION  After hours/weekend pager # available in AMION   

## 2023-08-17 NOTE — Progress Notes (Signed)
Patient remains comfortable and resting, continue to provide emotional support to family.

## 2023-08-17 NOTE — Progress Notes (Signed)
NAME:  Linda Hess, MRN:  [id number], DOB:  1931-02-15, LOS: 2 ADMISSION DATE:  08/15/2023, CONSULTATION DATE:  08/15/2023 REFERRING MD:  Dr. Wallace Cullens - EDP, CHIEF COMPLAINT:  Acute respiratory failure    History of Present Illness:  Linda Hess is a 89 y.o. female with a past medical history for chronic inflammatory demyelinating polyneuropathy, HTN, HLD, prior DVT, and type 2 diabetes who presented to the ED at [facility] 8/18 for altered mental status and fever.  Of note patient was recently seen in ED for UTI.  On arrival to ED patient was seen with temperature of 105.4, tachypnea, tachycardia, and mild hypotension.  Lab work on admission significant for NA 130, K3.3, glucose 225, creatinine 1.41, albumin 2.4, BNP 538, lactic acid 5.1, WBC 13.0.  Chest x-ray significant for likely right middle and upper lobe pneumonia.  UA on admission with many bacteria but no nitrates or leukocytes.  Given hypotension patient was started on vasopressors, PCCM consulted for further management admission to [facility].  Upon arrival to [facility], ICU bed to obtain patient was oriented and found to be frail.  Nasal cannula oxygen and peripheral Levophed.  Pertinent  Medical History  Chronic inflammatory demyelinating polyneuropathy, HTN, HLD, prior DVT, and type 2 diabetes  Significant Hospital Events: Including procedures, antibiotic start and stop dates in addition to other pertinent events   8/18 presented with altered mental status and fever with Tmax 105.6 admitted for septic shock secondary to likely pneumonia and UTI  Interim History / Subjective:  Sleeping comfortably.   Objective   Blood pressure (!) 114/55, pulse 66, temperature 97.9 F (36.6 C), temperature source Axillary, resp. rate 19, height 5\' 6"  (1.676 m), weight 65.6 kg, SpO2 92%.        Intake/Output Summary (Last 24 hours) at 08/17/2023 0747 Last data filed at 08/17/2023 0700 Gross per 24 hour  Intake 637.75 ml  Output 550 ml   Net 87.75 ml   Filed Weights   08/15/23 1029 08/16/23 0358  Weight: 64 kg 65.6 kg    Examination: General Appearance:  chronically ill appearing woman lying in bed in NAD  HEENT: [place]/AT, eyes anicteric Lungs: breathing comfortably on [place], CTAB, reduced basilar breath sounds Heart:  S1S2, RRR Abdomen: soft, NT Extremities:  pedal edema, no cyanosis Neurologic:  Moaning when awake, not moving around in bed Derm: bruising on arms, discolored lower legs diffusely.  8/18 blood culture- 1 site:  NGTD   Resolved Hospital Problem list   Lactic acidosis  Assessment & Plan:  Septic shock secondary to pneumonia - right upper and middle lobe.  -Follow cultures until finalized - Continue empiric azithromycin and ceftriaxone -Norepinephrine as required to maintain MAP greater than 65.  Acute respiratory failure due to pneumonia-resolved -Submental oxygen as needed to maintain SpO2 greater than 90%, not currently requiring this.  Acute Kidney Injury-suspect ATN secondary to sepsis. -Renally dose meds and avoid nephrotoxic meds - Strict I's/O - Maintain adequate perfusion.  Elevated troponin, EKG concerning for lateral ischemia. No RWMA on echo.  Likely troponin leak due to critical illness. -Continue aspirin - No need for heparin with reassuring echocardiogram -Telemetry monitoring  HFpEF -Echo October 2023 with preserved EF of 60 to 65%, no WMA, and grade 2 diastolic dysfunction Essential hypertension Hyperlipidemia -Continue holding PTA antihypertensives and diuretics  History of DVT anticoagulated at baseline with Xarelto -Resume PTA Xarelto  Hyponatremia Hypokalemia- resolved Hypophosphatemia -Recheck labs today  Type 2 diabetes, A1c 7.7 -Continue holding  PTA metformin and Januvia - Sliding scale insulin as needed. - Goal blood glucose 140-180.  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  One of her 4 sons was updated at bedside today.  Palliative care has been consulted to help manage her symptoms.  Family is indicated she has chronic suffering but want to continue treating her for this infection.   Best Practice (right click and "Reselect all SmartList Selections" daily)   Diet/type: NPO   DVT prophylaxis: DOAC GI prophylaxis: PPI Lines: Central line- picc Foley:  N/A Code Status:  full code Last date of multidisciplinary goals of care discussion: see 8/19 ipal note  08/15/23:  [name]: [phone]    LABS     CBC Recent Labs  Lab 08/15/23 1148 08/15/23 1921 08/16/23 0215  HGB 11.9* 11.2* 9.9*  HCT 36.6 34.9* 29.8*  WBC 13.0* 14.3* 12.8*  PLT 249 201 214      Recent Labs  Lab 08/10/23 1107 08/15/23 1148 08/15/23 1921 08/16/23 0149  NA 132* 130* 132* 134*  K 3.5 3.3* 3.7 4.3  CL 94* 94* 98 96*  CO2 24 22 21* 25  GLUCOSE 124* 225* 201* 141*  BUN 17 18 17 16   CREATININE 1.15* 1.41* 1.42* 1.21*  CALCIUM 9.4 9.1 8.8* 8.8*  MG 1.7  --   --  1.0*  PHOS  --   --   --  1.3*      This patient is critically ill with multiple organ system failure which requires frequent high complexity decision making, assessment, support, evaluation, and titration of therapies. This was completed through the application of advanced monitoring technologies and extensive interpretation of multiple databases. During this encounter critical care time was devoted to patient care services described in this note for 38 minutes.   Steffanie Dunn, DO 08/17/23 8:01 AM [facility]  For contact information, see Amion. If no response to pager, please call PCCM consult pager. After hours, 7PM- 7AM, please call  Elink.

## 2023-08-17 NOTE — Progress Notes (Signed)
Continuing plan of care. Emotional support provided to family and patient.  I have offered to reposition, but family expresses they wish not disturb her. Patient resting comfortably.

## 2023-08-17 NOTE — IPAL (Signed)
  Interdisciplinary Goals of Care Family Meeting   Date carried out: 08/17/2023  Location of Linda meeting: Bedside  Member's involved: Physician and Family Member or next of kin  Durable Power of Attorney or Environmental health practitioner: 2 sons    Discussion: We discussed goals of care for Linda Hess .  Linda Hess is moaning and not benefitting from her pain meds today and requiring more frequent doses. Her sons see her suffering and want to focus purely on comfort and discontinue aggressive care measures.   Code status:   Code Status: DNR   Disposition: In-patient comfort care  Time spent for Linda meeting: 10 min.    Steffanie Dunn, DO  08/17/2023, 12:24 PM

## 2023-08-18 DIAGNOSIS — Z515 Encounter for palliative care: Secondary | ICD-10-CM | POA: Diagnosis not present

## 2023-08-18 DIAGNOSIS — J9601 Acute respiratory failure with hypoxia: Secondary | ICD-10-CM | POA: Diagnosis not present

## 2023-08-18 DIAGNOSIS — N179 Acute kidney failure, unspecified: Secondary | ICD-10-CM | POA: Diagnosis not present

## 2023-08-18 DIAGNOSIS — A419 Sepsis, unspecified organism: Secondary | ICD-10-CM | POA: Diagnosis not present

## 2023-08-20 LAB — CULTURE, BLOOD (ROUTINE X 2): Culture: NO GROWTH

## 2023-08-29 NOTE — Progress Notes (Signed)
NAME:  ABREANNA MARQUARDT, MRN:  [id number], DOB:  April 20, 1931, LOS: 3 ADMISSION DATE:  08/15/2023, CONSULTATION DATE:  08/15/2023 REFERRING MD:  Dr. Wallace Cullens - EDP, CHIEF COMPLAINT:  Acute respiratory failure    History of Present Illness:  Linda Hess is a 89 y.o. female with a past medical history for chronic inflammatory demyelinating polyneuropathy, HTN, HLD, prior DVT, and type 2 diabetes who presented to the ED at [facility] 8/18 for altered mental status and fever.  Of note patient was recently seen in ED for UTI.  On arrival to ED patient was seen with temperature of 105.4, tachypnea, tachycardia, and mild hypotension.  Lab work on admission significant for NA 130, K3.3, glucose 225, creatinine 1.41, albumin 2.4, BNP 538, lactic acid 5.1, WBC 13.0.  Chest x-ray significant for likely right middle and upper lobe pneumonia.  UA on admission with many bacteria but no nitrates or leukocytes.  Given hypotension patient was started on vasopressors, PCCM consulted for further management admission to [facility].  Upon arrival to [facility], ICU bed to obtain patient was oriented and found to be frail.  Nasal cannula oxygen and peripheral Levophed.  Pertinent  Medical History  Chronic inflammatory demyelinating polyneuropathy, HTN, HLD, prior DVT, and type 2 diabetes  Significant Hospital Events: Including procedures, antibiotic start and stop dates in addition to other pertinent events   8/18 presented with altered mental status and fever with Tmax 105.6 admitted for septic shock secondary to likely pneumonia and UTI 8/20 made comfort care  Interim History / Subjective:  Rested well overnight, symptoms well-controlled per family.   Objective   Blood pressure 93/72, pulse (!) 53, temperature 98.7 F (37.1 C), temperature source Axillary, resp. rate (!) 4, height 5\' 6"  (1.676 m), weight 65.6 kg, SpO2 (!) 88%.        Intake/Output Summary (Last 24 hours) at  0719 Last data filed at  08/17/2023 2100 Gross per 24 hour  Intake 391.49 ml  Output 0 ml  Net 391.49 ml   Filed Weights   08/15/23 1029 08/16/23 0358  Weight: 64 kg 65.6 kg    Examination: General Appearance:  frail-appearing elderly woman lying in bed sleeping HEENT: [place]/AT, eyes anicteric Lungs: breathing comfortably, no accessory muscle use Derm: no rashes Neurologic:  sleeping comfortably  8/18 blood culture- 1 site:  NG   Resolved Hospital Problem list   Lactic acidosis  Assessment & Plan:  Septic shock secondary to pneumonia - right upper and middle lobe.  Acute respiratory failure due to pneumonia-resolved Acute Kidney Injury-suspect ATN secondary to sepsis. Elevated troponin, EKG concerning for lateral ischemia. No RWMA on echo.  Likely troponin leak due to critical illness. HFpEF, chronic. 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 Pain has been her primary concern, which seems well-controlled currently.  -She may be stable later to transfer out of ICU to a room on the floor to continue her care.   Best Practice (right click and "Reselect all SmartList Selections" daily)     Code Status:  DNR Last date of multidisciplinary goals  of care discussion: see 8/20 ipal note  08/15/23:  [name]: [phone]     Steffanie Dunn, DO  7:19 AM [facility]  For contact information, see Amion. If no response to pager, please call PCCM consult pager. After  hours, 7PM- 7AM, please call Elink.

## 2023-08-29 NOTE — TOC CM/SW Note (Signed)
Patient transitioned to comfort care. TOC following.

## 2023-08-29 NOTE — Death Summary Note (Signed)
DEATH SUMMARY   Patient Details  Name: Linda Hess MRN: [id number] DOB: [birth year]  Admission/Discharge Information   Admit Date:  08-23-23  Date of Death: Date of Death: 2023-08-26  Time of Death: Time of Death: 1737-08-29  Length of Stay: 3  Referring Physician: Smith Robert, MD   Reason(s) for Hospitalization  Septic shock  Diagnoses  Preliminary cause of death: sepsis due to R lung pneumonia, multilobar  Secondary Diagnoses (including complications and co-morbidities):  Septic shock secondary to pneumonia - right upper and middle lobe.  Acute respiratory failure due to pneumonia-resolved Acute Kidney Injury-suspect ATN secondary to sepsis. Elevated troponin, likely troponin leak due to critical illness. 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 year old female with a past medical history for chronic inflammatory demyelinating polyneuropathy, HTN, HLD, prior DVT, and type 2 diabetes who presented to the ED at [facility] 2023-08-23 for altered mental status and fever. Of note patient was recently seen in ED for UTI. On arrival to ED patient was seen with temperature of 105.4, tachypnea, tachycardia, and mild hypotension. Lab work on admission significant for NA 130, K3.3, glucose 225, creatinine 1.41, albumin 2.4, BNP 538, lactic acid 5.1, WBC 13.0. Chest x-ray significant for likely right middle and upper lobe pneumonia. UA on admission with many bacteria but no nitrates or  leukocytes. Given hypotension patient was started on vasopressors, PCCM consulted for further management admission to [facility]. Upon arrival to [facility], ICU bed to obtain patient was oriented and found to be frail. Nasal cannula oxygen and peripheral Levophed.   She continued to require supplemental oxygen and vasopressors despite antibiotics. Due to her severe baseline health and chronically uncontrolled pain, her family elected to focus more on her comfort. She required escalating doses of medications to manage her symptoms, including a continuous infusion of morphine. She passed away comfortably with family at bedside.     Pertinent Labs and Studies  Significant Diagnostic Studies ECHOCARDIOGRAM COMPLETE  Result Date: 08/16/2023    ECHOCARDIOGRAM REPORT   Patient Name:   SEPHRA PERALTA Date of Exam: 08/16/2023 Medical Rec #:  [id number]        Height:       66.0 in Accession #:    [id number]       Weight:       144.6 lb Date of Birth:  1931-11-18       BSA:          1.742 m Patient Age:    89 years         BP:           111/58 mmHg Patient Gender: F                HR:           67 bpm. Exam Location:  Inpatient Procedure: 2D Echo, Cardiac Doppler, Color Doppler and 3D Echo Indications:    Elevated Troponin  History:        Patient has prior history of Echocardiogram examinations, most  recent 10/23/2022. CHF, PAD; Risk Factors:Hypertension, Diabetes                 and Dyslipidemia.  Sonographer:    Lucendia Herrlich Referring Phys: [phone] WHITNEY D HARRIS IMPRESSIONS  1. Left ventricular ejection fraction, by estimation, is 60 to 65%. The left ventricle has normal function. The left ventricle has no regional wall motion abnormalities. There is moderate concentric left ventricular hypertrophy. Left ventricular diastolic parameters are consistent with Grade II diastolic dysfunction (pseudonormalization).  2. Right ventricular systolic function is normal. The right ventricular size is  normal.  3. Left atrial size was moderately dilated.  4. The mitral valve is normal in structure. Mild mitral valve regurgitation. No evidence of mitral stenosis.  5. The aortic valve is tricuspid. Aortic valve regurgitation is not visualized. Aortic valve sclerosis is present, with no evidence of aortic valve stenosis.  6. The inferior vena cava is normal in size with greater than 50% respiratory variability, suggesting right atrial pressure of 3 mmHg. FINDINGS  Left Ventricle: Left ventricular ejection fraction, by estimation, is 60 to 65%. The left ventricle has normal function. The left ventricle has no regional wall motion abnormalities. The left ventricular internal cavity size was normal in size. There is  moderate concentric left ventricular hypertrophy. Left ventricular diastolic parameters are consistent with Grade II diastolic dysfunction (pseudonormalization). Right Ventricle: The right ventricular size is normal. Right ventricular systolic function is normal. Left Atrium: Left atrial size was moderately dilated. Right Atrium: Right atrial size was normal in size. Pericardium: There is no evidence of pericardial effusion. Mitral Valve: The mitral valve is normal in structure. Mild mitral valve regurgitation. No evidence of mitral valve stenosis. Tricuspid Valve: The tricuspid valve is normal in structure. Tricuspid valve regurgitation is mild . No evidence of tricuspid stenosis. Aortic Valve: The aortic valve is tricuspid. Aortic valve regurgitation is not visualized. Aortic valve sclerosis is present, with no evidence of aortic valve stenosis. Aortic valve peak gradient measures 9.7 mmHg. Pulmonic Valve: The pulmonic valve was normal in structure. Pulmonic valve regurgitation is trivial. No evidence of pulmonic stenosis. Aorta: The aortic root is normal in size and structure. Venous: The inferior vena cava is normal in size with greater than 50% respiratory variability, suggesting right atrial pressure  of 3 mmHg. IAS/Shunts: No atrial level shunt detected by color flow Doppler.  LEFT VENTRICLE PLAX 2D LVIDd:         5.15 cm   Diastology LVIDs:         3.43 cm   LV e' medial:    6.68 cm/s LV PW:         1.73 cm   LV E/e' medial:  12.9 LV IVS:        1.10 cm   LV e' lateral:   9.95 cm/s LVOT diam:     2.20 cm   LV E/e' lateral: 8.7 LV SV:         80 LV SV Index:   46 LVOT Area:     3.80 cm                           3D Volume EF:                          3D EF:        57 %  LV EDV:       90 ml                          LV ESV:       39 ml                          LV SV:        51 ml RIGHT VENTRICLE             IVC RV S prime:     14.60 cm/s  IVC diam: 2.20 cm TAPSE (M-mode): 1.8 cm LEFT ATRIUM             Index        RIGHT ATRIUM           Index LA diam:        4.15 cm 2.38 cm/m   RA Area:     15.70 cm LA Vol (A2C):   73.5 ml 42.18 ml/m  RA Volume:   37.90 ml  21.75 ml/m LA Vol (A4C):   76.9 ml 44.13 ml/m LA Biplane Vol: 79.5 ml 45.63 ml/m  AORTIC VALVE AV Area (Vmax): 2.60 cm AV Vmax:        156.00 cm/s AV Peak Grad:   9.7 mmHg LVOT Vmax:      106.50 cm/s LVOT Vmean:     66.600 cm/s LVOT VTI:       0.211 m  AORTA Ao Root diam: 3.50 cm Ao Asc diam:  3.40 cm MITRAL VALVE               TRICUSPID VALVE MV Area (PHT): 4.89 cm    TR Peak grad:   29.8 mmHg MV Decel Time: 155 msec    TR Vmax:        273.00 cm/s MR Peak grad: 81.0 mmHg MR Vmax:      450.00 cm/s  SHUNTS MV E velocity: 86.20 cm/s  Systemic VTI:  0.21 m MV A velocity: 56.00 cm/s  Systemic Diam: 2.20 cm MV E/A ratio:  1.54 Olga Millers MD Electronically signed by Olga Millers MD Signature Date/Time: 08/16/2023/1:02:59 PM    Final    [place] EKG SITE RITE  Result Date: 08/15/2023 If Site Rite image not attached, placement could not be confirmed due to current cardiac rhythm.  DG Chest Port 1 View  Result Date: 08/15/2023 CLINICAL DATA:  Possible sepsis. EXAM: PORTABLE CHEST 1 VIEW COMPARISON:  08/10/2023 FINDINGS: New patchy  airspace disease is seen in the right mid and upper lung. Interstitial markings are diffusely coarsened with chronic features. No pleural effusion. The cardio pericardial silhouette is enlarged. Bones are diffusely demineralized. IMPRESSION: New patchy airspace disease in the right mid and upper lung. Imaging features compatible with pneumonia. Electronically Signed   By: Kennith Center M.D.   On: 08/15/2023 12:49   MR BRAIN W WO CONTRAST  Result Date: 08/13/2023 CLINICAL DATA:  Altered mental status with confusion. EXAM: MRI HEAD WITHOUT AND WITH CONTRAST TECHNIQUE: Multiplanar, multiecho pulse sequences of the brain and surrounding structures were obtained without and with intravenous contrast. CONTRAST:  6mL GADAVIST GADOBUTROL 1 MMOL/ML IV SOLN COMPARISON:  Head CT from 3 days ago FINDINGS: Brain: No acute infarction, hemorrhage, hydrocephalus, extra-axial collection or mass lesion. Moderate brain atrophy without specific pattern. Mild for age chronic microvascular ischemia in the cerebral white matter. No abnormal intracranial enhancement. Vascular: Grossly preserved flow voids  Skull and upper cervical spine: No detectable marrow signal abnormality. Sinuses/Orbits: Bilateral cataract resection. Other: Very motion degraded study with multiple nondiagnostic slices. IMPRESSION: 1. Aging brain without acute or reversible finding. 2. Very motion degraded study. Electronically Signed   By: Tiburcio Pea M.D.   On: 08/13/2023 13:52   CT CHEST ABDOMEN PELVIS WO CONTRAST  Result Date: 08/10/2023 CLINICAL DATA:  Trauma, multiple falls EXAM: CT CHEST, ABDOMEN AND PELVIS WITHOUT CONTRAST TECHNIQUE: Multidetector CT imaging of the chest, abdomen and pelvis was performed following the standard protocol without IV contrast. RADIATION DOSE REDUCTION: This exam was performed according to the departmental dose-optimization program which includes automated exposure control, adjustment of the mA and/or kV according to  patient size and/or use of iterative reconstruction technique. COMPARISON:  CT abdomen and pelvis done on 10/20/2022 FINDINGS: CT CHEST FINDINGS Cardiovascular: Calcifications are seen in thoracic aorta. Major vascular structures in mediastinum appear intact. Mediastinum/Nodes: There is no mediastinal hematoma. There is 2.7 cm low-density nodule in left lobe of the thyroid. Lungs/Pleura: There is no focal pulmonary consolidation. Breathing motion limits evaluation of lower lung fields. Increase in interstitial markings in lower lung fields may suggest scarring. There is ectasia bronchi in the lower lobes. Musculoskeletal: No acute findings are seen bony structures in thorax. There is encroachment of neural foramina by bony spurs in lower cervical spine. CT ABDOMEN PELVIS FINDINGS Hepatobiliary: No focal abnormalities are seen in liver. There is no dilation of intrahepatic bile ducts. Gallbladder is not seen. Pancreas: No focal abnormalities are seen. Spleen: Unremarkable. Adrenals/Urinary Tract: Adrenals are unremarkable. There is no hydronephrosis. There are no renal or ureteral stones. Urinary bladder is unremarkable. Stomach/Bowel: Moderate to large hiatal hernia is seen. Stomach is not distended. Small bowel loops are not dilated. Appendix is not dilated. There is no significant wall thickening in colon. There is no pericolic stranding. Vascular/Lymphatic: Arterial calcifications are seen in aorta and its major branches. Reproductive: Unremarkable. Other: There is no ascites or pneumoperitoneum. There is paraumbilical hernia containing fat. Musculoskeletal: Deformity in left superior and inferior pubic rami suggest old fractures. There are smoothly marginated calcifications anterior to the proximal left femur with no significant change. Decrease in height of upper endplate of body of L1 vertebra has not changed. Marked degenerative changes are noted in lumbar spine with spinal stenosis and encroachment of neural  foramina at multiple levels with no significant interval change. IMPRESSION: No acute findings are seen in noncontrast CT chest, abdomen and pelvis. There is no evidence of mediastinal or retroperitoneal hematoma. There is no focal pulmonary consolidation. There is no pleural effusion or pneumothorax. There is no laceration of the solid organs. There is no bowel wall thickening. There is no ascites or pneumoperitoneum. Moderate to large fixed hiatal hernia. Umbilical/paraumbilical hernia containing fat is seen. There is 2.7 cm low-density nodule in the left lobe of thyroid. Recommend non-emergent thyroid ultrasound if clinically warranted given patient age. Reference: J Am Coll Radiol. 2015 Feb;12(2): 143-50 Aortic arteriosclerosis.  Cervical spondylosis.  Lumbar spondylosis. Other chronic findings as described in the body of the report. Electronically Signed   By: Ernie Avena M.D.   On: 08/10/2023 13:14   CT Head Wo Contrast  Result Date: 08/10/2023 CLINICAL DATA:  Head trauma, minor (Age >= 65y); Neck trauma (Age >= 65y) EXAM: CT HEAD WITHOUT CONTRAST CT CERVICAL SPINE WITHOUT CONTRAST TECHNIQUE: Multidetector CT imaging of the head and cervical spine was performed following the standard protocol without intravenous contrast. Multiplanar CT image reconstructions of  the cervical spine were also generated. RADIATION DOSE REDUCTION: This exam was performed according to the departmental dose-optimization program which includes automated exposure control, adjustment of the mA and/or kV according to patient size and/or use of iterative reconstruction technique. COMPARISON:  CT Head and C Spine 10/20/22 FINDINGS: CT HEAD FINDINGS Brain: No evidence of acute infarction, hemorrhage, hydrocephalus, extra-axial collection or mass lesion/mass effect. Sequela of mild chronic microvascular ischemic change. Generalized volume loss. Vascular: No hyperdense vessel or unexpected calcification. Skull: Normal. Negative  for fracture or focal lesion. Sinuses/Orbits: No middle ear or mastoid effusion. Paranasal sinuses are clear. Orbits are unremarkable. Other: None. CT CERVICAL SPINE FINDINGS Alignment: Trace anterolisthesis of C3 on C4 and C4 on C5. Trace retrolisthesis of C5 on C6. Skull base and vertebrae: No acute fracture. No primary bone lesion or focal pathologic process. Soft tissues and spinal canal: No prevertebral fluid or swelling. No visible canal hematoma. Disc levels:  No CT evidence of high-grade spinal canal stenosis. Upper chest: Negative. Other: 2.6 cm left thyroid nodule, unchanged compared to 2023 IMPRESSION: 1. No acute intracranial abnormality. 2. No acute fracture or traumatic malalignment of the cervical spine. Electronically Signed   By: Lorenza Cambridge M.D.   On: 08/10/2023 12:51   CT Cervical Spine Wo Contrast  Result Date: 08/10/2023 CLINICAL DATA:  Head trauma, minor (Age >= 65y); Neck trauma (Age >= 65y) EXAM: CT HEAD WITHOUT CONTRAST CT CERVICAL SPINE WITHOUT CONTRAST TECHNIQUE: Multidetector CT imaging of the head and cervical spine was performed following the standard protocol without intravenous contrast. Multiplanar CT image reconstructions of the cervical spine were also generated. RADIATION DOSE REDUCTION: This exam was performed according to the departmental dose-optimization program which includes automated exposure control, adjustment of the mA and/or kV according to patient size and/or use of iterative reconstruction technique. COMPARISON:  CT Head and C Spine 10/20/22 FINDINGS: CT HEAD FINDINGS Brain: No evidence of acute infarction, hemorrhage, hydrocephalus, extra-axial collection or mass lesion/mass effect. Sequela of mild chronic microvascular ischemic change. Generalized volume loss. Vascular: No hyperdense vessel or unexpected calcification. Skull: Normal. Negative for fracture or focal lesion. Sinuses/Orbits: No middle ear or mastoid effusion. Paranasal sinuses are clear. Orbits  are unremarkable. Other: None. CT CERVICAL SPINE FINDINGS Alignment: Trace anterolisthesis of C3 on C4 and C4 on C5. Trace retrolisthesis of C5 on C6. Skull base and vertebrae: No acute fracture. No primary bone lesion or focal pathologic process. Soft tissues and spinal canal: No prevertebral fluid or swelling. No visible canal hematoma. Disc levels:  No CT evidence of high-grade spinal canal stenosis. Upper chest: Negative. Other: 2.6 cm left thyroid nodule, unchanged compared to 2023 IMPRESSION: 1. No acute intracranial abnormality. 2. No acute fracture or traumatic malalignment of the cervical spine. Electronically Signed   By: Lorenza Cambridge M.D.   On: 08/10/2023 12:51   DG Shoulder Right Portable  Result Date: 08/10/2023 CLINICAL DATA:  Pain after trauma EXAM: RIGHT SHOULDER - 3 VIEW COMPARISON:  None Available. FINDINGS: Severe osteopenia. Elevated humeral head. Please correlate for a full-thickness rotator cuff tear. There are some bony remodeling along the inferior aspect of the acromion. Slight degenerative changes of the [facility] joint IMPRESSION: Osteopenia with degenerative changes. Elevated humeral head. Please correlate for a full-thickness rotator cuff tear. Electronically Signed   By: Karen Kays M.D.   On: 08/10/2023 12:09   DG Chest Port 1 View  Result Date: 08/10/2023 CLINICAL DATA:  89 year old female with possible sepsis EXAM: PORTABLE CHEST 1 VIEW COMPARISON:  01/24/2023 FINDINGS: Cardiomediastinal silhouette unchanged in size and contour. No evidence of central vascular congestion. No interlobular septal thickening. Improved aeration in the bilateral lungs. Coarsened interstitial markings with no new airspace disease. No pneumothorax or pleural effusion. Coarsened interstitial markings, with no confluent airspace disease. No displaced fracture. IMPRESSION: Improved aeration of the lungs compared to the prior plain film, with no evidence of acute cardiopulmonary disease on the current.  Electronically Signed   By: Gilmer Mor D.O.   On: 08/10/2023 12:08    Microbiology Recent Results (from the past 240 hour(s))  Resp panel by RT-PCR (RSV, Flu A&B, Covid) Anterior Nasal Swab     Status: None   Collection Time: 08/10/23 10:50 AM   Specimen: Anterior Nasal Swab  Result Value Ref Range Status   SARS Coronavirus 2 by RT PCR NEGATIVE NEGATIVE Final    Comment: (NOTE) SARS-CoV-2 target nucleic acids are NOT DETECTED.  The SARS-CoV-2 RNA is generally detectable in upper respiratory specimens during the acute phase of infection. The lowest concentration of SARS-CoV-2 viral copies this assay can detect is 138 copies/mL. A negative result does not preclude SARS-Cov-2 infection and should not be used as the sole basis for treatment or other patient management decisions. A negative result may occur with  improper specimen collection/handling, submission of specimen other than nasopharyngeal swab, presence of viral mutation(s) within the areas targeted by this assay, and inadequate number of viral copies(<138 copies/mL). A negative result must be combined with clinical observations, patient history, and epidemiological information. The expected result is Negative.  Fact Sheet for Patients:  BloggerCourse.com  Fact Sheet for Healthcare Providers:  SeriousBroker.it  This test is no t yet approved or cleared by the [place] FDA and  has been authorized for detection and/or diagnosis of SARS-CoV-2 by FDA under an Emergency Use Authorization (EUA). This EUA will remain  in effect (meaning this test can be used) for the duration of the COVID-19 declaration under Section 564(b)(1) of the Act, 21 U.S.C.section 360bbb-3(b)(1), unless the authorization is terminated  or revoked sooner.       Influenza A by PCR NEGATIVE NEGATIVE Final   Influenza B by PCR NEGATIVE NEGATIVE Final    Comment: (NOTE) The Xpert Xpress  SARS-CoV-2/FLU/RSV plus assay is intended as an aid in the diagnosis of influenza from Nasopharyngeal swab specimens and should not be used as a sole basis for treatment. Nasal washings and aspirates are unacceptable for Xpert Xpress SARS-CoV-2/FLU/RSV testing.  Fact Sheet for Patients: BloggerCourse.com  Fact Sheet for Healthcare Providers: SeriousBroker.it  This test is not yet approved or cleared by the [place] FDA and has been authorized for detection and/or diagnosis of SARS-CoV-2 by FDA under an Emergency Use Authorization (EUA). This EUA will remain in effect (meaning this test can be used) for the duration of the COVID-19 declaration under Section 564(b)(1) of the Act, 21 U.S.C. section 360bbb-3(b)(1), unless the authorization is terminated or revoked.     Resp Syncytial Virus by PCR NEGATIVE NEGATIVE Final    Comment: (NOTE) Fact Sheet for Patients: BloggerCourse.com  Fact Sheet for Healthcare Providers: SeriousBroker.it  This test is not yet approved or cleared by the [place] FDA and has been authorized for detection and/or diagnosis of SARS-CoV-2 by FDA under an Emergency Use Authorization (EUA). This EUA will remain in effect (meaning this test can be used) for the duration of the COVID-19 declaration under Section 564(b)(1) of the Act, 21 U.S.C. section 360bbb-3(b)(1), unless the authorization is terminated or  revoked.  Performed at [facility], [address] [place], [place] [address]   Blood Culture (routine x 2)     Status: None   Collection Time: 08/10/23 11:07 AM   Specimen: BLOOD  Result Value Ref Range Status   Specimen Description BLOOD LEFT ANTECUBITAL  Final   Special Requests   Final    BOTTLES DRAWN AEROBIC AND ANAEROBIC Blood Culture results may not be optimal due to an inadequate volume of blood received in culture bottles   Culture    Final    NO GROWTH 5 DAYS Performed at [facility], [address] [place], [place] [address]    Report Status 08/15/2023 FINAL  Final  Blood Culture (routine x 2)     Status: None   Collection Time: 08/10/23 11:07 AM   Specimen: BLOOD  Result Value Ref Range Status   Specimen Description BLOOD BLOOD RIGHT ARM  Final   Special Requests   Final    Blood Culture adequate volume BOTTLES DRAWN AEROBIC ONLY   Culture   Final    NO GROWTH 5 DAYS Performed at [facility], [address], [place], [place] [address]    Report Status 08/15/2023 FINAL  Final  Urine Culture     Status: None   Collection Time: 08/10/23  1:38 PM   Specimen: Urine, Random  Result Value Ref Range Status   Specimen Description   Final    URINE, RANDOM Performed at [facility], [address] [place], [place] [address]    Special Requests   Final    NONE Performed at [facility], [address], [place], [place] [address]    Culture   Final    NO GROWTH Performed at [facility], [address], [place], [place] [address]    Report Status 08/11/2023 FINAL  Final  Blood Culture (routine x 2)     Status: None (Preliminary result)   Collection Time: 08/15/23 11:48 AM   Specimen: BLOOD LEFT ARM  Result Value Ref Range Status   Specimen Description BLOOD LEFT ARM BOTTLES DRAWN AEROBIC AND ANAEROBIC  Final   Special Requests   Final    Blood Culture results may not be optimal due to an excessive volume of blood received in culture bottles   Culture   Final    NO GROWTH 4 DAYS Performed at [facility], [address] [place], [place] [address]    Report Status PENDING  Incomplete  Resp panel by RT-PCR (RSV, Flu A&B, Covid) Anterior Nasal Swab     Status: None   Collection Time: 08/15/23 11:49 AM   Specimen: Anterior Nasal Swab  Result Value Ref Range Status   SARS Coronavirus 2 by RT PCR NEGATIVE NEGATIVE Final    Comment: (NOTE) SARS-CoV-2 target nucleic acids are NOT DETECTED.  The SARS-CoV-2  RNA is generally detectable in upper respiratory specimens during the acute phase of infection. The lowest concentration of SARS-CoV-2 viral copies this assay can detect is 138 copies/mL. A negative result does not preclude SARS-Cov-2 infection and should not be used as the sole basis for treatment or other patient management decisions. A negative result may occur with  improper specimen collection/handling, submission of specimen other than nasopharyngeal swab, presence of viral mutation(s) within the areas targeted by this assay, and inadequate number of viral copies(<138 copies/mL). A negative result must be combined with clinical observations, patient history, and epidemiological information. The expected result is Negative.  Fact Sheet for Patients:  BloggerCourse.com  Fact Sheet for Healthcare Providers:  SeriousBroker.it  This test is no t yet approved or cleared by the [place] FDA and  has been authorized for detection and/or diagnosis of SARS-CoV-2 by FDA under an Emergency Use Authorization (EUA). This EUA will remain  in effect (meaning this test can be used) for the duration of the COVID-19 declaration under Section 564(b)(1) of the Act, 21 U.S.C.section 360bbb-3(b)(1), unless the authorization is terminated  or revoked sooner.       Influenza A by PCR NEGATIVE NEGATIVE Final   Influenza B by PCR NEGATIVE NEGATIVE Final    Comment: (NOTE) The Xpert Xpress SARS-CoV-2/FLU/RSV plus assay is intended as an aid in the diagnosis of influenza from Nasopharyngeal swab specimens and should not be used as a sole basis for treatment. Nasal washings and aspirates are unacceptable for Xpert Xpress SARS-CoV-2/FLU/RSV testing.  Fact Sheet for Patients: BloggerCourse.com  Fact Sheet for Healthcare Providers: SeriousBroker.it  This test is not yet approved or cleared by the  [place] FDA and has been authorized for detection and/or diagnosis of SARS-CoV-2 by FDA under an Emergency Use Authorization (EUA). This EUA will remain in effect (meaning this test can be used) for the duration of the COVID-19 declaration under Section 564(b)(1) of the Act, 21 U.S.C. section 360bbb-3(b)(1), unless the authorization is terminated or revoked.     Resp Syncytial Virus by PCR NEGATIVE NEGATIVE Final    Comment: (NOTE) Fact Sheet for Patients: BloggerCourse.com  Fact Sheet for Healthcare Providers: SeriousBroker.it  This test is not yet approved or cleared by the [place] FDA and has been authorized for detection and/or diagnosis of SARS-CoV-2 by FDA under an Emergency Use Authorization (EUA). This EUA will remain in effect (meaning this test can be used) for the duration of the COVID-19 declaration under Section 564(b)(1) of the Act, 21 U.S.C. section 360bbb-3(b)(1), unless the authorization is terminated or revoked.  Performed at [facility], [address] [place], [place] [address]   Respiratory (~20 pathogens) panel by PCR     Status: None   Collection Time: 08/15/23  6:06 PM   Specimen: Nasopharyngeal Swab; Respiratory  Result Value Ref Range Status   Adenovirus NOT DETECTED NOT DETECTED Final   Coronavirus 229E NOT DETECTED NOT DETECTED Final    Comment: (NOTE) The Coronavirus on the Respiratory Panel, DOES NOT test for the novel  Coronavirus (2019 nCoV)    Coronavirus HKU1 NOT DETECTED NOT DETECTED Final   Coronavirus NL63 NOT DETECTED NOT DETECTED Final   Coronavirus OC43 NOT DETECTED NOT DETECTED Final   Metapneumovirus NOT DETECTED NOT DETECTED Final   Rhinovirus / Enterovirus NOT DETECTED NOT DETECTED Final   Influenza A NOT DETECTED NOT DETECTED Final   Influenza B NOT DETECTED NOT DETECTED Final   Parainfluenza Virus 1 NOT DETECTED NOT DETECTED Final   Parainfluenza Virus 2 NOT  DETECTED NOT DETECTED Final   Parainfluenza Virus 3 NOT DETECTED NOT DETECTED Final   Parainfluenza Virus 4 NOT DETECTED NOT DETECTED Final   Respiratory Syncytial Virus NOT DETECTED NOT DETECTED Final   Bordetella pertussis NOT DETECTED NOT DETECTED Final   Bordetella Parapertussis NOT DETECTED NOT DETECTED Final   Chlamydophila pneumoniae NOT DETECTED NOT DETECTED Final   Mycoplasma pneumoniae NOT DETECTED NOT DETECTED Final    Comment: Performed at [facility], [address] [place], [place] [address]  MRSA Next Gen by PCR, Nasal     Status: None   Collection Time: 08/15/23  6:06 PM  Result Value Ref Range Status   MRSA by PCR Next Gen NOT DETECTED NOT DETECTED Final    Comment: (NOTE) The GeneXpert MRSA Assay (FDA approved for NASAL specimens only), is one component of a comprehensive MRSA colonization surveillance program. It is not intended to diagnose MRSA infection nor to guide or monitor treatment for MRSA infections. Test performance is not FDA approved in patients less than 60 years old. Performed at [facility], [address], [place], [place] [address]     Lab Basic Metabolic Panel: Recent Labs  Lab 08/15/23 1148 08/15/23 1921 08/16/23 0149 08/17/23 0815  NA 130* 132* 134* 132*  K 3.3* 3.7 4.3 3.5  CL 94* 98 96* 98  CO2 22 21* 25 24  GLUCOSE 225* 201* 141* 272*  BUN 18 17 16 21   CREATININE 1.41* 1.42* 1.21* 0.89  CALCIUM 9.1 8.8* 8.8* 8.6*  MG  --   --  1.0* 2.2  PHOS  --   --  1.3* 2.4*   Liver Function Tests: Recent Labs  Lab 08/15/23 1148  AST 39  ALT 17  ALKPHOS 39  BILITOT 1.1  PROT 5.2*  ALBUMIN 2.4*   No results for input(s): "LIPASE", "AMYLASE" in the last 168 hours. No results for input(s): "AMMONIA" in the last 168 hours. CBC: Recent Labs  Lab 08/15/23 1148 08/15/23 1921 08/16/23 0215  WBC 13.0* 14.3* 12.8*  NEUTROABS 10.3*  --   --   HGB 11.9* 11.2* 9.9*  HCT 36.6 34.9* 29.8*  MCV 90.4 89.0 88.2  PLT 249 201  214   Cardiac Enzymes: No results for input(s): "CKTOTAL", "CKMB", "CKMBINDEX", "TROPONINI" in the last 168 hours. Sepsis Labs: Recent Labs  Lab 08/15/23 1148 08/15/23 1306 08/15/23 1921 08/16/23 0148 08/16/23 0149 08/16/23 0215  PROCALCITON  --   --   --   --  66.67  --   WBC 13.0*  --  14.3*  --   --  12.8*  LATICACIDVEN 5.1* 5.8* 5.3* 2.4*  --  1.5    Procedures/Operations  none   Steffanie Dunn 08/19/2023, 8:51 AM

## 2023-08-29 DEATH — deceased
